# Patient Record
Sex: Male | Born: 1955
Health system: Southern US, Community
[De-identification: ages and names within clinical notes are randomized; demographics above are authoritative.]

## PROBLEM LIST (undated history)

## (undated) DIAGNOSIS — F329 Major depressive disorder, single episode, unspecified: Secondary | ICD-10-CM

## (undated) DIAGNOSIS — E041 Nontoxic single thyroid nodule: Secondary | ICD-10-CM

## (undated) DIAGNOSIS — G43009 Migraine without aura, not intractable, without status migrainosus: Secondary | ICD-10-CM

## (undated) DIAGNOSIS — G8929 Other chronic pain: Secondary | ICD-10-CM

## (undated) DIAGNOSIS — F411 Generalized anxiety disorder: Secondary | ICD-10-CM

## (undated) DIAGNOSIS — K5900 Constipation, unspecified: Secondary | ICD-10-CM

## (undated) DIAGNOSIS — F32A Depression, unspecified: Secondary | ICD-10-CM

## (undated) DIAGNOSIS — R519 Headache, unspecified: Secondary | ICD-10-CM

## (undated) DIAGNOSIS — M47812 Spondylosis without myelopathy or radiculopathy, cervical region: Secondary | ICD-10-CM

## (undated) DIAGNOSIS — M4802 Spinal stenosis, cervical region: Secondary | ICD-10-CM

## (undated) DIAGNOSIS — N486 Induration penis plastica: Secondary | ICD-10-CM

## (undated) DIAGNOSIS — I4510 Unspecified right bundle-branch block: Secondary | ICD-10-CM

## (undated) DIAGNOSIS — M79601 Pain in right arm: Secondary | ICD-10-CM

## (undated) DIAGNOSIS — F5221 Male erectile disorder: Secondary | ICD-10-CM

## (undated) DIAGNOSIS — T7840XA Allergy, unspecified, initial encounter: Secondary | ICD-10-CM

## (undated) DIAGNOSIS — M5416 Radiculopathy, lumbar region: Secondary | ICD-10-CM

## (undated) DIAGNOSIS — F1910 Other psychoactive substance abuse, uncomplicated: Secondary | ICD-10-CM

## (undated) DIAGNOSIS — M79602 Pain in left arm: Secondary | ICD-10-CM

## (undated) DIAGNOSIS — N401 Enlarged prostate with lower urinary tract symptoms: Secondary | ICD-10-CM

## (undated) DIAGNOSIS — M1990 Unspecified osteoarthritis, unspecified site: Secondary | ICD-10-CM

## (undated) DIAGNOSIS — D649 Anemia, unspecified: Secondary | ICD-10-CM

## (undated) DIAGNOSIS — L439 Lichen planus, unspecified: Secondary | ICD-10-CM

## (undated) DIAGNOSIS — E789 Disorder of lipoprotein metabolism, unspecified: Secondary | ICD-10-CM

## (undated) DIAGNOSIS — G4700 Insomnia, unspecified: Secondary | ICD-10-CM

## (undated) DIAGNOSIS — Z981 Arthrodesis status: Secondary | ICD-10-CM

## (undated) DIAGNOSIS — R51 Headache: Secondary | ICD-10-CM

## (undated) DIAGNOSIS — K219 Gastro-esophageal reflux disease without esophagitis: Secondary | ICD-10-CM

## (undated) DIAGNOSIS — M7918 Myalgia, other site: Secondary | ICD-10-CM

## (undated) DIAGNOSIS — F1020 Alcohol dependence, uncomplicated: Secondary | ICD-10-CM

## (undated) DIAGNOSIS — M5030 Other cervical disc degeneration, unspecified cervical region: Secondary | ICD-10-CM

## (undated) DIAGNOSIS — F419 Anxiety disorder, unspecified: Secondary | ICD-10-CM

## (undated) DIAGNOSIS — F1011 Alcohol abuse, in remission: Secondary | ICD-10-CM

## (undated) DIAGNOSIS — K210 Gastro-esophageal reflux disease with esophagitis: Secondary | ICD-10-CM

## (undated) DIAGNOSIS — H409 Unspecified glaucoma: Secondary | ICD-10-CM

## (undated) HISTORY — DX: Insomnia, unspecified: G47.00

## (undated) HISTORY — DX: Other chronic pain: G89.29

## (undated) HISTORY — DX: Anemia, unspecified: D64.9

## (undated) HISTORY — DX: Male erectile disorder: F52.21

## (undated) HISTORY — DX: Spondylosis without myelopathy or radiculopathy, cervical region: M47.812

## (undated) HISTORY — DX: Benign prostatic hyperplasia with lower urinary tract symptoms: N40.1

## (undated) HISTORY — DX: Unspecified right bundle-branch block: I45.10

## (undated) HISTORY — DX: Pain in left arm: M79.602

## (undated) HISTORY — DX: Gastro-esophageal reflux disease without esophagitis: K21.9

## (undated) HISTORY — DX: Arthrodesis status: Z98.1

## (undated) HISTORY — DX: Disorder of lipoprotein metabolism, unspecified: E78.9

## (undated) HISTORY — DX: Generalized anxiety disorder: F41.1

## (undated) HISTORY — DX: Gastro-esophageal reflux disease with esophagitis: K21.0

## (undated) HISTORY — DX: Constipation, unspecified: K59.00

## (undated) HISTORY — DX: Anxiety disorder, unspecified: F41.9

## (undated) HISTORY — DX: Other cervical disc degeneration, unspecified cervical region: M50.30

## (undated) HISTORY — DX: Myalgia, other site: M79.18

## (undated) HISTORY — DX: Unspecified osteoarthritis, unspecified site: M19.90

## (undated) HISTORY — DX: Unspecified glaucoma: H40.9

## (undated) HISTORY — DX: Migraine without aura, not intractable, without status migrainosus: G43.009

## (undated) HISTORY — DX: Nontoxic single thyroid nodule: E04.1

## (undated) HISTORY — PX: HERNIA REPAIR: SHX51

## (undated) HISTORY — DX: Other psychoactive substance abuse, uncomplicated: F19.10

## (undated) HISTORY — DX: Alcohol abuse, in remission: F10.11

## (undated) HISTORY — DX: Alcohol dependence, uncomplicated: F10.20

## (undated) HISTORY — DX: Headache, unspecified: R51.9

## (undated) HISTORY — DX: Depression, unspecified: F32.A

## (undated) HISTORY — DX: Radiculopathy, lumbar region: M54.16

## (undated) HISTORY — DX: Major depressive disorder, single episode, unspecified: F32.9

## (undated) HISTORY — DX: Allergy, unspecified, initial encounter: T78.40XA

## (undated) HISTORY — DX: Induration penis plastica: N48.6

## (undated) HISTORY — DX: Spinal stenosis, cervical region: M48.02

## (undated) HISTORY — DX: Headache: R51

## (undated) HISTORY — DX: Pain in right arm: M79.601

## (undated) HISTORY — PX: SPINE SURGERY: SHX786

## (undated) HISTORY — DX: Lichen planus, unspecified: L43.9

---

## 2008-11-12 DIAGNOSIS — M4802 Spinal stenosis, cervical region: Secondary | ICD-10-CM

## 2008-11-12 HISTORY — PX: CERVICAL SPINE SURGERY: SHX589

## 2008-11-12 HISTORY — DX: Spinal stenosis, cervical region: M48.02

## 2014-04-29 DIAGNOSIS — M47812 Spondylosis without myelopathy or radiculopathy, cervical region: Secondary | ICD-10-CM

## 2014-04-29 DIAGNOSIS — Z981 Arthrodesis status: Secondary | ICD-10-CM | POA: Insufficient documentation

## 2014-04-29 DIAGNOSIS — M79601 Pain in right arm: Secondary | ICD-10-CM | POA: Insufficient documentation

## 2014-04-29 DIAGNOSIS — M79602 Pain in left arm: Secondary | ICD-10-CM | POA: Insufficient documentation

## 2014-04-29 DIAGNOSIS — M5030 Other cervical disc degeneration, unspecified cervical region: Secondary | ICD-10-CM

## 2014-04-29 HISTORY — DX: Spondylosis without myelopathy or radiculopathy, cervical region: M47.812

## 2014-04-29 HISTORY — DX: Other cervical disc degeneration, unspecified cervical region: M50.30

## 2014-04-29 HISTORY — DX: Arthrodesis status: Z98.1

## 2016-03-29 DIAGNOSIS — G4700 Insomnia, unspecified: Secondary | ICD-10-CM

## 2016-03-29 DIAGNOSIS — F331 Major depressive disorder, recurrent, moderate: Secondary | ICD-10-CM | POA: Insufficient documentation

## 2016-03-29 DIAGNOSIS — F1011 Alcohol abuse, in remission: Secondary | ICD-10-CM

## 2016-03-29 DIAGNOSIS — F411 Generalized anxiety disorder: Secondary | ICD-10-CM | POA: Insufficient documentation

## 2016-03-29 HISTORY — DX: Generalized anxiety disorder: F41.1

## 2016-03-29 HISTORY — DX: Alcohol abuse, in remission: F10.11

## 2016-03-29 HISTORY — DX: Insomnia, unspecified: G47.00

## 2016-09-24 LAB — LIPID PANEL
Cholesterol: 182 (ref 0–200)
LDL Cholesterol: 104
Triglycerides: 74 (ref 40–160)

## 2016-09-24 LAB — BASIC METABOLIC PANEL
BUN: 8 (ref 4–21)
Creatinine: 1 (ref 0.6–1.3)
GLUCOSE: 102
Potassium: 4.2 (ref 3.4–5.3)
Sodium: 140 (ref 137–147)

## 2016-09-24 LAB — CBC AND DIFFERENTIAL
HCT: 44 (ref 41–53)
Hemoglobin: 14.9 (ref 13.5–17.5)
Neutrophils Absolute: 3053
PLATELETS: 246 (ref 150–399)
WBC: 4.9

## 2016-09-24 LAB — HEMOGLOBIN A1C: Hemoglobin A1C: 5

## 2016-09-24 LAB — PSA: PSA: 1.2

## 2016-09-24 LAB — HEPATIC FUNCTION PANEL
ALK PHOS: 56 (ref 25–125)
ALT: 13 (ref 10–40)
AST: 14 (ref 14–40)
BILIRUBIN, TOTAL: 0.6

## 2016-09-24 LAB — IRON,TIBC AND FERRITIN PANEL: Iron: 138

## 2016-09-24 LAB — TSH: TSH: 1.97 (ref 0.41–5.90)

## 2016-09-24 LAB — VITAMIN B12: VITAMIN B 12: 416

## 2016-09-24 LAB — VITAMIN D 25 HYDROXY (VIT D DEFICIENCY, FRACTURES): VIT D 25 HYDROXY: 32

## 2016-10-18 DIAGNOSIS — I4510 Unspecified right bundle-branch block: Secondary | ICD-10-CM | POA: Insufficient documentation

## 2016-10-18 HISTORY — DX: Unspecified right bundle-branch block: I45.10

## 2016-11-12 DIAGNOSIS — N486 Induration penis plastica: Secondary | ICD-10-CM | POA: Insufficient documentation

## 2016-11-12 HISTORY — DX: Induration penis plastica: N48.6

## 2017-10-23 LAB — CBC AND DIFFERENTIAL
HCT: 43 (ref 41–53)
HEMATOCRIT: 43 (ref 41–53)
HEMOGLOBIN: 14.7 (ref 13.5–17.5)
Hemoglobin: 14.7 (ref 13.5–17.5)
NEUTROS ABS: 2832
PLATELETS: 220 (ref 150–399)
Platelets: 220 (ref 150–399)
WBC: 4.9
WBC: 4.9

## 2017-10-23 LAB — HEPATIC FUNCTION PANEL
ALT: 12 (ref 10–40)
AST: 13 — AB (ref 14–40)
Bilirubin, Total: 0.5

## 2017-10-23 LAB — LIPID PANEL
Cholesterol: 190 (ref 0–200)
HDL: 66 (ref 35–70)
LDL CALC: 107
TRIGLYCERIDES: 82 (ref 40–160)
Triglycerides: 82 (ref 40–160)

## 2017-10-23 LAB — BASIC METABOLIC PANEL
BUN: 10 (ref 4–21)
Creatinine: 1 (ref 0.6–1.3)
Glucose: 2
Glucose: 94
Potassium: 3.8 (ref 3.4–5.3)
SODIUM: 142 (ref 137–147)
Sodium: 142 (ref 137–147)

## 2017-10-23 LAB — PSA: PSA: 1

## 2017-10-23 LAB — VITAMIN B12
Vitamin B-12: 695
Vitamin B-12: 695

## 2017-10-23 LAB — HEMOGLOBIN A1C: HEMOGLOBIN A1C: 5.3

## 2017-10-23 LAB — TSH: TSH: 2.71 (ref 0.41–5.90)

## 2017-10-23 LAB — IRON,TIBC AND FERRITIN PANEL: IRON: 124

## 2017-10-23 LAB — VITAMIN D 25 HYDROXY (VIT D DEFICIENCY, FRACTURES): VIT D 25 HYDROXY: 34

## 2017-11-21 ENCOUNTER — Ambulatory Visit (INDEPENDENT_AMBULATORY_CARE_PROVIDER_SITE_OTHER): Payer: Managed Care, Other (non HMO) | Admitting: Family Medicine

## 2017-11-21 ENCOUNTER — Encounter: Payer: Self-pay | Admitting: Family Medicine

## 2017-11-21 VITALS — BP 130/76 | HR 75 | Temp 98.4°F | Wt 221.2 lb

## 2017-11-21 DIAGNOSIS — F3130 Bipolar disorder, current episode depressed, mild or moderate severity, unspecified: Secondary | ICD-10-CM | POA: Diagnosis not present

## 2017-11-21 DIAGNOSIS — Z981 Arthrodesis status: Secondary | ICD-10-CM | POA: Diagnosis not present

## 2017-11-21 DIAGNOSIS — G8929 Other chronic pain: Secondary | ICD-10-CM | POA: Diagnosis not present

## 2017-11-21 DIAGNOSIS — H409 Unspecified glaucoma: Secondary | ICD-10-CM

## 2017-11-21 DIAGNOSIS — E041 Nontoxic single thyroid nodule: Secondary | ICD-10-CM | POA: Diagnosis not present

## 2017-11-21 DIAGNOSIS — N486 Induration penis plastica: Secondary | ICD-10-CM

## 2017-11-21 DIAGNOSIS — N5201 Erectile dysfunction due to arterial insufficiency: Secondary | ICD-10-CM | POA: Insufficient documentation

## 2017-11-21 DIAGNOSIS — M7918 Myalgia, other site: Secondary | ICD-10-CM | POA: Diagnosis not present

## 2017-11-21 DIAGNOSIS — F319 Bipolar disorder, unspecified: Secondary | ICD-10-CM

## 2017-11-21 DIAGNOSIS — F5221 Male erectile disorder: Secondary | ICD-10-CM

## 2017-11-21 DIAGNOSIS — N401 Enlarged prostate with lower urinary tract symptoms: Secondary | ICD-10-CM

## 2017-11-21 DIAGNOSIS — K2100 Gastro-esophageal reflux disease with esophagitis, without bleeding: Secondary | ICD-10-CM

## 2017-11-21 DIAGNOSIS — M5416 Radiculopathy, lumbar region: Secondary | ICD-10-CM | POA: Insufficient documentation

## 2017-11-21 DIAGNOSIS — E789 Disorder of lipoprotein metabolism, unspecified: Secondary | ICD-10-CM | POA: Insufficient documentation

## 2017-11-21 DIAGNOSIS — D649 Anemia, unspecified: Secondary | ICD-10-CM

## 2017-11-21 DIAGNOSIS — G43909 Migraine, unspecified, not intractable, without status migrainosus: Secondary | ICD-10-CM | POA: Insufficient documentation

## 2017-11-21 DIAGNOSIS — G4700 Insomnia, unspecified: Secondary | ICD-10-CM

## 2017-11-21 DIAGNOSIS — F411 Generalized anxiety disorder: Secondary | ICD-10-CM

## 2017-11-21 DIAGNOSIS — L439 Lichen planus, unspecified: Secondary | ICD-10-CM

## 2017-11-21 DIAGNOSIS — F329 Major depressive disorder, single episode, unspecified: Secondary | ICD-10-CM | POA: Insufficient documentation

## 2017-11-21 DIAGNOSIS — K5909 Other constipation: Secondary | ICD-10-CM | POA: Insufficient documentation

## 2017-11-21 DIAGNOSIS — G43009 Migraine without aura, not intractable, without status migrainosus: Secondary | ICD-10-CM

## 2017-11-21 DIAGNOSIS — F32A Depression, unspecified: Secondary | ICD-10-CM | POA: Insufficient documentation

## 2017-11-21 DIAGNOSIS — K2970 Gastritis, unspecified, without bleeding: Secondary | ICD-10-CM | POA: Insufficient documentation

## 2017-11-21 DIAGNOSIS — K5900 Constipation, unspecified: Secondary | ICD-10-CM | POA: Insufficient documentation

## 2017-11-21 HISTORY — DX: Benign prostatic hyperplasia with lower urinary tract symptoms: N40.1

## 2017-11-21 HISTORY — DX: Migraine without aura, not intractable, without status migrainosus: G43.009

## 2017-11-21 HISTORY — DX: Other chronic pain: G89.29

## 2017-11-21 HISTORY — DX: Myalgia, other site: M79.18

## 2017-11-21 HISTORY — DX: Anemia, unspecified: D64.9

## 2017-11-21 HISTORY — DX: Nontoxic single thyroid nodule: E04.1

## 2017-11-21 HISTORY — DX: Unspecified glaucoma: H40.9

## 2017-11-21 HISTORY — DX: Male erectile disorder: F52.21

## 2017-11-21 HISTORY — DX: Gastro-esophageal reflux disease with esophagitis, without bleeding: K21.00

## 2017-11-21 HISTORY — DX: Lichen planus, unspecified: L43.9

## 2017-11-21 MED ORDER — BUPROPION HCL ER (XL) 300 MG PO TB24: 300.0000 mg | ORAL_TABLET | Freq: Every day | ORAL | 1 refills | Status: DC

## 2017-11-21 NOTE — Progress Notes (Signed)
Michael Garcia is a 62 y.o. male is here to [place].   Patient Care Team: Briscoe Deutscher, DO as PCP - General (Family Medicine)   History of Present Illness:   Michael Garcia, CMA acting as scribe for Dr. Briscoe Deutscher.   HPI:   Patient has been going [facility] for specialist visits in [place]. Seeing pain medications in regard to migraines and cervical pain. Patient has not tried Botox injections. He has appointments in March for follow up with pain management and several other specialist but he is not going to be able to continue and would like referral to local specialist.  Psychology. He admits to having trouble sleeping and does not feel that his medications need to be evaluated. He was given information for [employer].   Endocrinology: Will just follow up yearly with provider in [place] yearly.  Urology: will need referral for local provider.   He does have local ophthalmologist [facility]. Saw last week. Will send notes.  Pain: States that he only gets refill on flexeril about every three months. Would like referral for pain clinic and headache clinic. Has done some physical therapy in the past for cervical pain.    Health Maintenance Due  Topic Date Due  . Hepatitis C Screening  February 27, 1956  . HIV Screening  05/01/1973  . TETANUS/TDAP  05/01/1977  . COLONOSCOPY  05/01/2008   Depression screen PHQ 2/9 11/21/2017  Decreased Interest 0  Down, Depressed, Hopeless 1  PHQ - 2 Score 1  Altered sleeping 3  Tired, decreased energy 0  Change in appetite 0  Feeling bad or failure about yourself  0  Trouble concentrating 1  Moving slowly or fidgety/restless 0  Suicidal thoughts 0  PHQ-9 Score 5   PMHx, SurgHx, SocialHx, Medications, and Allergies were reviewed in the Visit Navigator and updated as appropriate.   Past Medical History:  Diagnosis Date  . Anemia 11/21/2017  . Benign non-nodular prostatic hyperplasia with lower urinary tract  symptoms 11/21/2017  . Bilateral arm pain 04/29/2014  . Bipolar disorder with depression ([place]) 11/21/2017  . Cervical spondylosis without myelopathy 04/29/2014  . Cervical stenosis of spinal canal 11/12/2008  . Chronic myofascial pain 11/21/2017  . Constipation 11/21/2017  . Degenerative disc disease, cervical 04/29/2014  . Erectile disorder, acquired, generalized, severe 11/21/2017  . GAD (generalized anxiety disorder) 03/29/2016  . Gastro-esophageal reflux disease with esophagitis 11/21/2017  . Glaucoma 11/21/2017  . Incomplete right bundle branch block 10/18/2016  . Insomnia, persistent 03/29/2016  . Lichen planus, penis 11/21/2017  . Lipid disorder 11/21/2017  . Lumbar radicular pain 11/21/2017  . Nondependent alcohol abuse, in remission 03/29/2016  . Nonintractable migraine, unspecified migraine type 11/21/2017  . Peyronie disease 11/12/2016  . Status post cervical spinal fusion 04/29/2014  . Thyroid nodule 11/21/2017   History reviewed. No pertinent surgical history. History reviewed. No pertinent family history. Social History   Tobacco Use  . Smoking status: Former Smoker    Types: Cigarettes    Last attempt to quit: 1994    Years since quitting: 25.0  . Smokeless tobacco: Never Used  Substance Use Topics  . Alcohol use: No    Frequency: Never  . Drug use: No    Current Medications and Allergies:   .  alfuzosin (UROXATRAL) 10 MG 24 hr tablet, Take 1 tablet by mouth daily., Disp: , Rfl: 11 .  ALPRAZolam (XANAX) 1 MG tablet, Take 1 tablet by mouth daily as needed., Disp: , Rfl:  .  buPROPion (WELLBUTRIN XL) 150 MG 24 hr tablet, Take 1 tablet by mouth daily., Disp: , Rfl:  .  cetirizine (ZYRTEC) 10 MG tablet, Take 1 tablet by mouth daily., Disp: , Rfl:  .  cyclobenzaprine (FLEXERIL) 10 MG tablet, Take 1 tablet by mouth as needed., Disp: , Rfl:  .  esomeprazole (NEXIUM) 40 MG capsule, TAKE ONE CAPSULE BY MOUTH EVERY DAY, Disp: , Rfl:  .  FIBER PO, Take by mouth., Disp: , Rfl:  .   fluticasone (FLONASE) 50 MCG/ACT nasal spray, , Disp: , Rfl:  .  Galcanezumab-gnlm (EMGALITY) 120 MG/ML SOAJ, Inject into the skin., Disp: , Rfl:  .  lamoTRIgine (LAMICTAL) 200 MG tablet, TK 1 T PO D, Disp: , Rfl: 3 .  lidocaine (LIDODERM) 5 %, USE AS DIRECTED ONCE DAILY EXTERNALLY, Disp: , Rfl:  .  Lidocaine 5 % CREA, , Disp: , Rfl:  .  Melatonin 3 MG TABS, , Disp: , Rfl:  .  MULTIPLE VITAMIN PO, Take by mouth., Disp: , Rfl:  .  rizatriptan (MAXALT) 10 MG tablet, Take by mouth., Disp: , Rfl:  .  sildenafil (VIAGRA) 100 MG tablet, , Disp: , Rfl:  .  tacrolimus (PROTOPIC) 0.1 % ointment, APPLY TO AFFECTED AREAS UTD, Disp: , Rfl:  .  traZODone (DESYREL) 100 MG tablet, Take by mouth., Disp: , Rfl:    Allergies  Allergen Reactions  . Bacitracin Itching and Rash  . Iodine Itching and Rash    IV Iodine   . Sulfamethoxazole-Trimethoprim Rash   Review of Systems:   Pertinent items are noted in the HPI. Otherwise, ROS is negative.  Vitals:   Vitals:   11/21/17 0853  BP: 130/76  Pulse: 75  Temp: 98.4 F (36.9 C)  TempSrc: Oral  SpO2: 96%  Weight: 221 lb 3.2 oz (100.3 kg)     There is no height or weight on file to calculate BMI.  Physical Exam:   Physical Exam  Constitutional: He is oriented to person, place, and time. He appears well-developed and well-nourished. No distress.  HENT:  Head: Normocephalic and atraumatic.  Right Ear: External ear normal.  Left Ear: External ear normal.  Nose: Nose normal.  Mouth/Throat: Oropharynx is clear and moist.  Eyes: Conjunctivae and EOM are normal. Pupils are equal, round, and reactive to light.  Neck: Normal range of motion. Neck supple.  Cardiovascular: Normal rate, regular rhythm, normal heart sounds and intact distal pulses.  Pulmonary/Chest: Effort normal and breath sounds normal.  Abdominal: Soft. Bowel sounds are normal.  Musculoskeletal: Normal range of motion.  Neurological: He is alert and oriented to person, place, and  time.  Skin: Skin is warm and dry.  Psychiatric: He has a normal mood and affect. His behavior is normal. Judgment and thought content normal.  Nursing note and vitals reviewed.  Assessment and Plan:   1. Status post cervical spinal fusion See numbers 6 and 7.  2. Thyroid nodule Patient states that he has been cleared for evaluation in 1 year.  3. Bipolar disorder with depression ([place]) Patient suffers from depressive disorder more than anything with his bipolar.  He has been stable on medications for quite some time.  He does request an increase in Wellbutrin today which would be appropriate.  I am okay with continuing with his current treatment at this time.  He will be calling [facility] for regular therapy.  I did let him know that if his medications need to be adjusted more often, that we  will require him to see psychiatry.  He agrees to this.  He denies any suicidal homicidal ideations today.  4. Lichen planus Controlled with current medications.  Okay refill Protopic when needed.  5. Erectile disorder, acquired, generalized, severe Patient is experiencing erectile dysfunction as well as other urologic concerns.  Prior to leaving [place], he was offered surgical options.  He is not interested in going that route at this time.  He does need follow-up with urology and I will go ahead and put that referral in.  - Ambulatory referral to Urology  6. Nonintractable migraine This issue, combined with the below chronic myofascial pain, is the patient's most concerning issue today.  He has chronic headaches and upper back pain.  He had a previous cervical fusion.  His most recent notes from [place] were reviewed by me.  The patient would like to find a way to treat headaches and upper back pain at the same time to stop the pain loop.  I will work on finding the appropriate specialist to help [place] with this.  He is interested in Botox injections, trigger point injections, acupuncture,  physical therapy, or any other nonmedication modalities for pain control if they would help.  7. Chronic myofascial pain See above.  8. Insomnia, persistent Patient uses trazodone.  Okay to continue at this time.  9. GAD (generalized anxiety disorder) Patient uses Xanax sparingly.  Okay to refill unless he starts to increase need.  10. Peyronie disease See above for referral to urology.  . Reviewed expectations re: course of current medical issues. . Discussed self-management of symptoms. . Outlined signs and symptoms indicating need for more acute intervention. . Patient verbalized understanding and all questions were answered. Marland Kitchen Health Maintenance issues including appropriate healthy diet, exercise, and smoking avoidance were discussed with patient. . See orders for this visit as documented in the electronic medical record. . Patient received an After Visit Summary.  CMA served as Education administrator during this visit. History, Physical, and Plan performed by medical provider. The above documentation has been reviewed and is accurate and complete. Briscoe Deutscher, D.O.   Briscoe Deutscher, DO [place] [place] 11/23/2017  Records requested if needed. Time spent with the patient: 30 minutes, of which >50% was spent in obtaining information about his symptoms, reviewing his previous labs, evaluations, and treatments, counseling him about his condition (please see the discussed topics above), and developing a plan to further investigate it; he had a number of questions which I addressed.

## 2017-11-23 ENCOUNTER — Encounter: Payer: Self-pay | Admitting: Family Medicine

## 2017-12-10 ENCOUNTER — Encounter: Payer: Self-pay | Admitting: Family Medicine

## 2017-12-11 ENCOUNTER — Other Ambulatory Visit: Payer: Self-pay | Admitting: Family Medicine

## 2017-12-11 DIAGNOSIS — N486 Induration penis plastica: Secondary | ICD-10-CM

## 2017-12-11 DIAGNOSIS — M4802 Spinal stenosis, cervical region: Secondary | ICD-10-CM

## 2017-12-11 DIAGNOSIS — M5030 Other cervical disc degeneration, unspecified cervical region: Secondary | ICD-10-CM

## 2017-12-11 DIAGNOSIS — M7918 Myalgia, other site: Secondary | ICD-10-CM

## 2017-12-11 DIAGNOSIS — F319 Bipolar disorder, unspecified: Secondary | ICD-10-CM

## 2017-12-11 DIAGNOSIS — M47812 Spondylosis without myelopathy or radiculopathy, cervical region: Secondary | ICD-10-CM

## 2017-12-11 DIAGNOSIS — N401 Enlarged prostate with lower urinary tract symptoms: Secondary | ICD-10-CM

## 2017-12-11 DIAGNOSIS — Z981 Arthrodesis status: Secondary | ICD-10-CM

## 2017-12-11 DIAGNOSIS — M5416 Radiculopathy, lumbar region: Secondary | ICD-10-CM

## 2017-12-11 DIAGNOSIS — G8929 Other chronic pain: Secondary | ICD-10-CM

## 2018-01-13 ENCOUNTER — Encounter: Payer: Self-pay | Admitting: Family Medicine

## 2018-01-15 ENCOUNTER — Other Ambulatory Visit: Payer: Self-pay | Admitting: Surgical

## 2018-01-15 MED ORDER — LIDOCAINE 5 % EX PTCH: MEDICATED_PATCH | CUTANEOUS | 2 refills | Status: DC

## 2018-01-15 MED ORDER — GALCANEZUMAB-GNLM 120 MG/ML ~~LOC~~ SOAJ: 120.0000 mg | SUBCUTANEOUS | 3 refills | Status: DC | PRN

## 2018-01-15 MED ORDER — BUPROPION HCL ER (XL) 300 MG PO TB24: 300.0000 mg | ORAL_TABLET | Freq: Every day | ORAL | 1 refills | Status: DC

## 2018-01-15 MED ORDER — LAMOTRIGINE 200 MG PO TABS: ORAL_TABLET | ORAL | 3 refills | Status: DC

## 2018-01-15 MED ORDER — TRAZODONE HCL 100 MG PO TABS: 100.0000 mg | ORAL_TABLET | Freq: Every evening | ORAL | 2 refills | Status: DC | PRN

## 2018-01-15 MED ORDER — RIZATRIPTAN BENZOATE 10 MG PO TABS: 10.0000 mg | ORAL_TABLET | ORAL | 1 refills | Status: DC | PRN

## 2018-01-21 ENCOUNTER — Telehealth: Payer: Self-pay

## 2018-01-21 NOTE — Telephone Encounter (Signed)
Pre auth started for [employer]  Key: [id number] Started on cover my meds.

## 2018-01-23 ENCOUNTER — Other Ambulatory Visit: Payer: Self-pay | Admitting: Surgical

## 2018-01-23 ENCOUNTER — Encounter: Payer: Self-pay | Admitting: Family Medicine

## 2018-01-23 DIAGNOSIS — Z1211 Encounter for screening for malignant neoplasm of colon: Secondary | ICD-10-CM

## 2018-01-23 MED ORDER — ALFUZOSIN HCL ER 10 MG PO TB24: 10.0000 mg | ORAL_TABLET | Freq: Every day | ORAL | 0 refills | Status: DC

## 2018-01-24 ENCOUNTER — Encounter: Payer: Self-pay | Admitting: Gastroenterology

## 2018-01-27 DIAGNOSIS — N5201 Erectile dysfunction due to arterial insufficiency: Secondary | ICD-10-CM | POA: Diagnosis not present

## 2018-01-27 DIAGNOSIS — N486 Induration penis plastica: Secondary | ICD-10-CM | POA: Diagnosis not present

## 2018-01-27 DIAGNOSIS — R3915 Urgency of urination: Secondary | ICD-10-CM | POA: Diagnosis not present

## 2018-01-28 NOTE — Telephone Encounter (Signed)
Fax received from insurance medication denied:  Requires that you try two of the listed treatments I have put fax on your desk for review.

## 2018-01-29 NOTE — Telephone Encounter (Signed)
Given back to you or Michael Garcia. Will need to contact patient re: previous medication trials versus getting specialist to take care of this.

## 2018-01-30 NOTE — Telephone Encounter (Signed)
Called patient he has not heard from Dr. Maryjean Ka. I called his office and spoke with them. Their was some confusion about referral and it had not been made yet. She is going to speak with provider and see where patient can be worked in as soon as possible and give him a call. I have notified patient if he has not heard from them by Monday he will call our office. He would like to hold of and try to get meds approved by their office he has not tried any of the meds listed yet. ppw sent to scan.

## 2018-02-03 NOTE — Telephone Encounter (Signed)
Per CRM: Source Subject Topic  Pleasant, [place] "Michael Garcia" (Patient) [name] "Michael Garcia" (Patient) Quick Communication - See Telephone Encounter  CRM for notification. See Telephone encounter for: 02/03/18.    Cover my Meds called in to follow up, they are stating that appeal paperwork for [employer] was faxed over on Friday 01/31/18 and would like to confirm receipt.     Please advise. CB: [id number] REF# [id number]

## 2018-02-03 NOTE — Telephone Encounter (Signed)
We are not getting auth the pain clinic will work on that .

## 2018-02-06 ENCOUNTER — Encounter: Payer: Self-pay | Admitting: Family Medicine

## 2018-02-07 ENCOUNTER — Other Ambulatory Visit: Payer: Self-pay | Admitting: Surgical

## 2018-02-07 DIAGNOSIS — M5030 Other cervical disc degeneration, unspecified cervical region: Secondary | ICD-10-CM

## 2018-02-07 NOTE — Telephone Encounter (Signed)
Patient will need to have new referral placed to see Dr. Ernestina Patches with [facility]. Once referral is placed I will route appropriately.

## 2018-02-17 ENCOUNTER — Encounter: Payer: Self-pay | Admitting: Family Medicine

## 2018-02-19 MED ORDER — MELOXICAM 15 MG PO TABS: 15.0000 mg | ORAL_TABLET | Freq: Every day | ORAL | 0 refills | Status: DC

## 2018-02-22 ENCOUNTER — Other Ambulatory Visit: Payer: Self-pay | Admitting: Family Medicine

## 2018-02-24 MED ORDER — ALFUZOSIN HCL ER 10 MG PO TB24: 10.0000 mg | ORAL_TABLET | Freq: Every day | ORAL | 0 refills | Status: DC

## 2018-03-04 ENCOUNTER — Ambulatory Visit (INDEPENDENT_AMBULATORY_CARE_PROVIDER_SITE_OTHER): Payer: 59

## 2018-03-04 ENCOUNTER — Ambulatory Visit (INDEPENDENT_AMBULATORY_CARE_PROVIDER_SITE_OTHER): Payer: 59 | Admitting: Physical Medicine and Rehabilitation

## 2018-03-04 ENCOUNTER — Encounter: Payer: Self-pay | Admitting: Family Medicine

## 2018-03-04 ENCOUNTER — Encounter (INDEPENDENT_AMBULATORY_CARE_PROVIDER_SITE_OTHER): Payer: Self-pay | Admitting: Physical Medicine and Rehabilitation

## 2018-03-04 VITALS — BP 117/74 | HR 76

## 2018-03-04 DIAGNOSIS — M545 Low back pain: Secondary | ICD-10-CM | POA: Diagnosis not present

## 2018-03-04 DIAGNOSIS — M542 Cervicalgia: Secondary | ICD-10-CM | POA: Diagnosis not present

## 2018-03-04 DIAGNOSIS — Z981 Arthrodesis status: Secondary | ICD-10-CM

## 2018-03-04 DIAGNOSIS — M4802 Spinal stenosis, cervical region: Secondary | ICD-10-CM

## 2018-03-04 DIAGNOSIS — M5416 Radiculopathy, lumbar region: Secondary | ICD-10-CM | POA: Diagnosis not present

## 2018-03-04 NOTE — Progress Notes (Signed)
  Numeric Pain Rating Scale and Functional Assessment Average Pain 6   In the last MONTH (on 0-10 scale) has pain interfered with the following?  1. General activity like being  able to carry out your everyday physical activities such as walking, climbing stairs, carrying groceries, or moving a chair?  Rating(4)     

## 2018-03-06 ENCOUNTER — Other Ambulatory Visit (INDEPENDENT_AMBULATORY_CARE_PROVIDER_SITE_OTHER): Payer: Self-pay | Admitting: Physical Medicine and Rehabilitation

## 2018-03-06 DIAGNOSIS — M4802 Spinal stenosis, cervical region: Secondary | ICD-10-CM

## 2018-03-06 DIAGNOSIS — Z981 Arthrodesis status: Secondary | ICD-10-CM

## 2018-03-06 NOTE — Progress Notes (Unsigned)
amb ref °

## 2018-03-10 ENCOUNTER — Encounter: Payer: Self-pay | Admitting: Gastroenterology

## 2018-03-10 ENCOUNTER — Ambulatory Visit: Payer: 59 | Admitting: Gastroenterology

## 2018-03-10 VITALS — BP 122/78 | HR 83 | Ht 76.0 in | Wt 223.5 lb

## 2018-03-10 DIAGNOSIS — K2100 Gastro-esophageal reflux disease with esophagitis, without bleeding: Secondary | ICD-10-CM

## 2018-03-10 DIAGNOSIS — K5900 Constipation, unspecified: Secondary | ICD-10-CM

## 2018-03-10 NOTE — Progress Notes (Signed)
HPI: This is a very pleasant 62 year old man who was referred to me by Briscoe Deutscher, DO  to evaluate chronic constipation, chronic GERD.    Chief complaint is constipation, GERD  Constipation chroincally, getting worse. Takes in a lot of fiber daily (supplements).   He does not see blood in his stool  Has GERD 30 years on nexium; before moirning usually. Terrible pyrosis if he misses it.  About once per week he will have acid regurge, minor vomiting.  Generally occurs if constipated.  Certain foods (pizza, tomato based,).  No etoh, non smoker  Big caffeine drinker.  At least 4 large cups of coffee daily  Gaining weight 10 pounds in past two months.  Constipated, up to 5 days without a BM despite fiber above.  No dysphagia.  Colon and EGD 5 years ago: something removed from his stomach (??).  "Diverticulitis-like;"  and he was told to have repeat colonoscopy at 5 years Not sure if any polpyp  Uncle with colon cancer.   Old Data Reviewed: Labs 10/2017: cbc normal; bmet normal    Review of systems: Pertinent positive and negative review of systems were noted in the above HPI section. All other review negative.   Past Medical History:  Diagnosis Date  . Alcoholism ([place])   . Anemia 11/21/2017  . Anxiety   . Arthritis   . Benign non-nodular prostatic hyperplasia with lower urinary tract symptoms 11/21/2017  . Bilateral arm pain 04/29/2014  . Bipolar disorder with depression ([place]) 11/21/2017  . Cervical spondylosis without myelopathy 04/29/2014  . Cervical stenosis of spinal canal 11/12/2008  . Chronic headaches   . Chronic myofascial pain 11/21/2017  . Constipation 11/21/2017  . Degenerative disc disease, cervical 04/29/2014  . Depression   . Erectile disorder, acquired, generalized, severe 11/21/2017  . GAD (generalized anxiety disorder) 03/29/2016  . Gastro-esophageal reflux disease with esophagitis 11/21/2017  . Glaucoma 11/21/2017  . Incomplete right bundle branch block  10/18/2016  . Insomnia, persistent 03/29/2016  . Lichen planus, penis 11/21/2017  . Lipid disorder 11/21/2017  . Lumbar radicular pain 11/21/2017  . Nondependent alcohol abuse, in remission 03/29/2016  . Nonintractable migraine, unspecified migraine type 11/21/2017  . Peyronie disease 11/12/2016  . Status post cervical spinal fusion 04/29/2014  . Thyroid nodule 11/21/2017    Past Surgical History:  Procedure Laterality Date  . CERVICAL SPINE SURGERY  2010   c6-c7 fusion  . HERNIA REPAIR      Current Outpatient Medications  Medication Sig Dispense Refill  . alfuzosin (UROXATRAL) 10 MG 24 hr tablet Take 1 tablet (10 mg total) by mouth daily. 30 tablet 0  . ALPRAZolam (XANAX) 1 MG tablet Take 1 tablet by mouth daily as needed.    Marland Kitchen buPROPion (WELLBUTRIN XL) 300 MG 24 hr tablet Take 1 tablet (300 mg total) by mouth daily. 90 tablet 1  . cetirizine (ZYRTEC) 10 MG tablet Take 1 tablet by mouth daily.    . cyclobenzaprine (FLEXERIL) 10 MG tablet Take 1 tablet by mouth as needed.    Marland Kitchen esomeprazole (NEXIUM) 40 MG capsule TAKE ONE CAPSULE BY MOUTH EVERY DAY    . FIBER PO Take by mouth.    . fluticasone (FLONASE) 50 MCG/ACT nasal spray     . Galcanezumab-gnlm (EMGALITY) 120 MG/ML SOAJ Inject 120 mg into the skin as needed. 1 mL 3  . lamoTRIgine (LAMICTAL) 200 MG tablet TK 1 T PO D 90 tablet 3  . lidocaine (LIDODERM) 5 % USE AS DIRECTED ONCE  DAILY EXTERNALLY 30 patch 2  . Lidocaine 5 % CREA     . Melatonin 3 MG TABS     . meloxicam (MOBIC) 15 MG tablet Take 1 tablet (15 mg total) by mouth daily. 30 tablet 0  . MULTIPLE VITAMIN PO Take by mouth.    . rizatriptan (MAXALT) 10 MG tablet Take 1 tablet (10 mg total) by mouth as needed for migraine. 10 tablet 1  . sildenafil (VIAGRA) 100 MG tablet     . tacrolimus (PROTOPIC) 0.1 % ointment APPLY TO AFFECTED AREAS UTD    . traZODone (DESYREL) 100 MG tablet Take 1 tablet (100 mg total) by mouth at bedtime as needed for sleep. 30 tablet 2   No current  facility-administered medications for this visit.     Allergies as of 03/10/2018 - Review Complete 03/10/2018  Allergen Reaction Noted  . Bacitracin Itching and Rash 03/13/2015  . Iodine Itching and Rash 04/12/1980  . Sulfamethoxazole-trimethoprim Rash 02/02/2016    Family History  Problem Relation Age of Onset  . Heart disease Mother   . Irritable bowel syndrome Mother   . Diabetes Sister     Social History   Socioeconomic History  . Marital status: Married    Spouse name: Not on file  . Number of children: Not on file  . Years of education: Not on file  . Highest education level: Not on file  Occupational History  . Occupation: Scientist, physiological: [employer]  . Financial resource strain: Not on file  . Food insecurity:    Worry: Not on file    Inability: Not on file  . Transportation needs:    Medical: Not on file    Non-medical: Not on file  Tobacco Use  . Smoking status: Former Smoker    Types: Cigarettes    Last attempt to quit: 1994    Years since quitting: 25.3  . Smokeless tobacco: Never Used  Substance and Sexual Activity  . Alcohol use: No    Frequency: Never  . Drug use: No  . Sexual activity: Not on file  Lifestyle  . Physical activity:    Days per week: Not on file    Minutes per session: Not on file  . Stress: Not on file  Relationships  . Social connections:    Talks on phone: Not on file    Gets together: Not on file    Attends religious service: Not on file    Active member of club or organization: Not on file    Attends meetings of clubs or organizations: Not on file    Relationship status: Not on file  . Intimate partner violence:    Fear of current or ex partner: Not on file    Emotionally abused: Not on file    Physically abused: Not on file    Forced sexual activity: Not on file  Other Topics Concern  . Not on file  Social History Narrative   Patient recently moved to this area with his wife and daughter.  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  Though he has many medical issues, he is a generally healthy appearing gentleman.  He does not exercise regularly but does try to exercise as much as possible.  No high risk behaviors.  He was previously traveling to [facility] for treatments but will be transitioning his care to [place].     Physical Exam: BP 122/78   Pulse 83  Ht 6\' 4"  (1.93 m)   Wt 223 lb 8 oz (101.4 kg)   BMI 27.21 kg/m  Constitutional: generally well-appearing Psychiatric: alert and oriented x3 Eyes: extraocular movements intact Mouth: oral pharynx moist, no lesions Neck: supple no lymphadenopathy Cardiovascular: heart regular rate and rhythm Lungs: clear to auscultation bilaterally Abdomen: soft, nontender, nondistended, no obvious ascites, no peritoneal signs, normal bowel sounds Extremities: no lower extremity edema bilaterally Skin: no lesions on visible extremities   Assessment and plan: 62 y.o. male with chronic GERD, chronic constipation  Neither of these symptoms require invasive testing currently.  I do recommend that he try adding MiraLAX to his fiber regimen daily.  I also recommend that he consider H2-blocker on the nights when he takes in a lot of tomato based products because those seem to be associated with his breakthrough GERD symptoms.  We will get records from his [facility] colonoscopy, upper endoscopy.  He was told by his previous doctor that he needed a colonoscopy 5 years from his last one however I am not sure why "diverticulitis-like findings" would necessitate a repeat colonoscopy in 5-year interval and so I will look into it a bit further.  Please see the "Patient Instructions" section for addition details about the plan.   Owens Loffler, MD [facility] 03/10/2018, 10:02 AM  Cc: Briscoe Deutscher, DO

## 2018-03-10 NOTE — Patient Instructions (Addendum)
We will get records sent from your previous gastroenterologist [facility]) for review.  This will include any endoscopic (colonoscopy or upper endoscopy) procedures and any associated pathology reports.   Start miralax once daily, in addition to your fiber. Consider zantac or pepcid on nights when you eat tomato based foods.  Normal BMI (Body Mass Index- based on height and weight) is between 19 and 25. Your BMI today is Body mass index is 27.21 kg/m. Marland Kitchen Please consider follow up  regarding your BMI with your Primary Care Provider.

## 2018-03-11 DIAGNOSIS — M542 Cervicalgia: Secondary | ICD-10-CM | POA: Diagnosis not present

## 2018-03-11 DIAGNOSIS — M79605 Pain in left leg: Secondary | ICD-10-CM | POA: Diagnosis not present

## 2018-03-11 DIAGNOSIS — M4802 Spinal stenosis, cervical region: Secondary | ICD-10-CM | POA: Diagnosis not present

## 2018-03-11 DIAGNOSIS — M545 Low back pain: Secondary | ICD-10-CM | POA: Diagnosis not present

## 2018-03-12 ENCOUNTER — Telehealth (INDEPENDENT_AMBULATORY_CARE_PROVIDER_SITE_OTHER): Payer: Self-pay | Admitting: *Deleted

## 2018-03-12 NOTE — Telephone Encounter (Signed)
Called pt and left vm #1 to schedule OV for MRI review. MRI is scheduled for 03/18/18

## 2018-03-13 ENCOUNTER — Other Ambulatory Visit: Payer: Self-pay

## 2018-03-13 ENCOUNTER — Telehealth: Payer: Self-pay | Admitting: Gastroenterology

## 2018-03-13 DIAGNOSIS — G43009 Migraine without aura, not intractable, without status migrainosus: Secondary | ICD-10-CM

## 2018-03-13 NOTE — Telephone Encounter (Signed)
Referral placed.

## 2018-03-13 NOTE — Telephone Encounter (Signed)
  EGD October 2014 Dr. Clarene Reamer cough, [place].  Indications "reflux like dyspepsia".  Findings of hiatal hernia, "moderately severe acute gastritis" polyp was found in the body the stomach which was biopsied.  Pathology showed no H. pylori and that the polyp in the stomach was a fundic gland polyp  Colonoscopy October 2014 Dr. left golf, [place].  Indications "average risk screening for colon cancer and colon polyps" findings moderately severe diverticulosis in the sigmoid colon otherwise normal examination without any polyps removed.     Can you please call him and let him know I reviewed his colonoscopy and upper endoscopy reports from October 2014.  He needs repeat screening colonoscopy October 2024.  Please put him in our recall system for that.

## 2018-03-13 NOTE — Progress Notes (Signed)
er

## 2018-03-13 NOTE — Telephone Encounter (Signed)
Recall in Epic Left message on machine to call back

## 2018-03-14 NOTE — Telephone Encounter (Signed)
The patient has been notified of this information and all questions answered.

## 2018-03-15 ENCOUNTER — Encounter: Payer: Self-pay | Admitting: Family Medicine

## 2018-03-17 ENCOUNTER — Other Ambulatory Visit: Payer: Self-pay | Admitting: Surgical

## 2018-03-17 DIAGNOSIS — M4802 Spinal stenosis, cervical region: Secondary | ICD-10-CM | POA: Diagnosis not present

## 2018-03-17 DIAGNOSIS — M545 Low back pain: Secondary | ICD-10-CM | POA: Diagnosis not present

## 2018-03-17 DIAGNOSIS — M79605 Pain in left leg: Secondary | ICD-10-CM | POA: Diagnosis not present

## 2018-03-17 DIAGNOSIS — M542 Cervicalgia: Secondary | ICD-10-CM | POA: Diagnosis not present

## 2018-03-17 MED ORDER — ESOMEPRAZOLE MAGNESIUM 40 MG PO CPDR: 40.0000 mg | DELAYED_RELEASE_CAPSULE | Freq: Every day | ORAL | 2 refills | Status: DC

## 2018-03-18 ENCOUNTER — Ambulatory Visit
Admission: RE | Admit: 2018-03-18 | Discharge: 2018-03-18 | Disposition: A | Discharge status: Home / Self Care | Payer: 59 | Source: Ambulatory Visit | Attending: Physical Medicine and Rehabilitation | Admitting: Physical Medicine and Rehabilitation

## 2018-03-18 DIAGNOSIS — M545 Low back pain: Secondary | ICD-10-CM | POA: Diagnosis not present

## 2018-03-18 DIAGNOSIS — M5416 Radiculopathy, lumbar region: Secondary | ICD-10-CM

## 2018-03-18 DIAGNOSIS — M5126 Other intervertebral disc displacement, lumbar region: Secondary | ICD-10-CM | POA: Diagnosis not present

## 2018-03-18 DIAGNOSIS — M5185 Other intervertebral disc disorders, thoracolumbar region: Secondary | ICD-10-CM | POA: Diagnosis not present

## 2018-03-19 ENCOUNTER — Encounter (INDEPENDENT_AMBULATORY_CARE_PROVIDER_SITE_OTHER): Payer: Self-pay | Admitting: Physical Medicine and Rehabilitation

## 2018-03-19 NOTE — Progress Notes (Signed)
Michael Garcia - 62 y.o. male MRN [id number]  Date of birth: 1956/03/12  Office Visit Note: Visit Date: 03/04/2018 PCP: Briscoe Deutscher, DO Referred by: Briscoe Deutscher, DO  Subjective: Chief Complaint  Patient presents with  . Neck - Pain  . Lower Back - Pain  . Left Lower Leg - Pain  . Left Hip - Pain   HPI: Michael Garcia is a 62 year old gentleman who now lives in [place] but has been in [place] for the last several years and was getting his medical care at the [facility].  Luckily we are able to review his notes which are fairly extensive.  He comes in today at the request of Dr. Briscoe Deutscher for evaluation and management of his headaches and upper back pain and really lower back pain.  Interestingly when he saw her his neck pain and migraines are really much more of an issue but today he really talks about his low back pain is being the worst issue.  In terms of his low back pain it is more left-sided and it radiates around to his left groin.  He gets a sharp pain into the left buttock and sometimes it will radiate down into the ankle.  This seems to be a mixed picture of radicular or possibly musculoskeletal type pain referral patterns.  He does get some right buttock pain as well.  He says he is not having much pain relief today when he does get the pain he actually has difficulty putting weight on his legs and has a severe amount of pain at that point and then it will kind of come and go.  He denies any numbness and tingling down the legs.  He says walking actually helps.  Bending forward or lying down actually increases the pain.  He has not really had much work done on his lower back recently.  He has not had any lumbar surgery or advanced imaging.  He has not had any trauma.  The notes from [place] showed mainly treatments for his occipital neuralgia and headache type pain.  He does have a prior history of a C6-7 ACDF.  This was completed in 2010 on relief.  He has been seeing  a pain management physician physician, anesthesiologist and they have been completing pain injections for migraine type issues with occipital blocks.  From a standpoint of his upper back and neck pain he does get pain across the shoulders and neck bilaterally with a lot of stiffness.  He reports that radiates up to his head and sometimes will get migraine headaches and he feels like originate from his neck.  He has had Botox injections in the past as well for migraines.  He continues to take Maxalt.  He also takes meloxicam.  He has history is complicated by history of bipolar disorder which is mainly depressive disorder.  He is on Lamictal and he does take some Xanax as needed.  He is on bupropion.  He has been using some Flexeril.  He has tried other muscle relaxers.  In terms of imaging he has not had a lumbar spine MRI.  He has had x-rays performed in [place] but I cannot see those images and I cannot review the reports.  We did get AP and lateral cervical and lumbar spine x-rays.  These are reviewed with the patient and reviewed below.   Review of Systems  Constitutional: Positive for malaise/fatigue. Negative for chills, fever and weight loss.  HENT: Negative for hearing  loss and sinus pain.   Eyes: Negative for blurred vision, double vision and photophobia.  Respiratory: Negative for cough and shortness of breath.   Cardiovascular: Negative for chest pain, palpitations and leg swelling.  Gastrointestinal: Negative for abdominal pain, nausea and vomiting.  Genitourinary: Negative for flank pain.  Musculoskeletal: Positive for back pain, joint pain and neck pain. Negative for myalgias.  Skin: Negative for itching and rash.  Neurological: Positive for headaches. Negative for tingling, tremors, focal weakness and weakness.  Endo/Heme/Allergies: Negative.   Psychiatric/Behavioral: Negative for depression.  All other systems reviewed and are negative.  Otherwise per HPI.  Assessment & Plan: Visit  Diagnoses:  1. Neck pain   2. S/P cervical spinal fusion   3. Spinal stenosis of cervical region   4. Low back pain, unspecified back pain laterality, unspecified chronicity, with sciatica presence unspecified   5. Lumbar radicular pain     Plan: Findings:  History of chronic neck and upper back pain and some low back pain now with worsening left radicular pain into the buttocks and referring into the groin.  He has no pain with hip rotation and I do think this is more related to his lumbar spine.  He actually gets a significant amount of pain with weightbearing but it is sporadic.  He denies any numbness or tingling or other red flag complaints.  He has not had any weight loss.  The fact that he is having such a bit of pain down the leg and into the groin without really findings on exam I think it would be wise to get a lumbar spine MRI just to characterize his lumbar spine.  He has had prior cervical ACDF.  He will continue with the meloxicam and Flexeril.  Consideration would be given to epidural injection versus regrouping with a physical therapist for other medication management.  From a standpoint of his neck pain as well as headaches I think he is probably having chronic migraines and one source of initiating the migraine may be cervicogenic.  He does have some arthritis in the upper levels of the cervical spine.  We could look at third occipital nerve blocks bilaterally to see if that helps and then look at possibly radiofrequency ablation.  I think it would also be wise to have him see a neurologist who specializes in migraine headaches.  2 doctors that we have used in the past is  Dr. Evelena Asa in [place].  This may be a far drive for him as he does have to travel to [place] where  works as a Clinical biochemist at [employer].  Another choice might be Dr. Floyde Parkins at [facility] neurology.  They were both have more experience in current medication and other management techniques for chronic  migraine and I would.  I would be happy to look at potential occipital blocks or upper cervical blocks depending on his course of treatment.  He does have myofascial pain as well with trigger points in the levator scapula trapezius and supraspinatus.  I do think he would do well with dry needling and myofascial release.  I am going to make a referral to [facility] physical therapy.    Meds & Orders: No orders of the defined types were placed in this encounter.   Orders Placed This Encounter  Procedures  . XR Lumbar Spine 2-3 Views  . XR Cervical Spine 2 or 3 views  . MR LUMBAR SPINE WO CONTRAST    Follow-up: Return for MRI review after completion.  Procedures: No procedures performed  No notes on file   Clinical History: Edi, Rad Results In - 01/16/2017  9:50 AM EST  Exam: MRI C-spine without contrast  IMAGING TECHNIQUE: Multiplanar multisequence MRI images of the cervical spine was obtained WITHOUT IV contrast.  INDICATION: Neck pain.  COMPARISON: Cervical spine MRI 04/01/2014.  OBSERVATIONS:  Exam: MRI C-spine without contrast  IMAGING TECHNIQUE: Multiplanar multisequence MRI images of the cervical spine was obtained without IV contrast.  INDICATION: Neck pain  COMPARISON: None  OBSERVATIONS:  Redemonstration of the ACDF at C6-C7.   The vertebral bodies are well aligned, demonstrating preserved height. No significant spinal listhesis. No acute fractures or suspicious bone marrow lesions.  The cervicomedullary junction is unremarkable. The visualized spinal cord is normal in signal and caliber without compression.   Mild congenital spinal canal stenosis most prominent from C3 through C6.   Mild disc space narrowing from C3 through C6 and C7-T1.  The pre and paravertebral soft tissues are unremarkable.  C2-C3: Mild congenital canal stenosis, but no neuroforaminal narrowing. C3-C4: Disc bulge, resulting mild spinal canal stenosis and uncovertebral arthrosis,  resulting in moderate left neuroforaminal narrowing. C4-C5: Disc bulge, resulting mild spinal canal stenosis and mild bilateral neural foraminal narrowing. C5-C6: Disc bulge and posterior arch hypertrophy, resulting mild to moderate canal stenosis and bilateral neuroforaminal narrowing, moderate to severe on the right and moderate on the left.  C6-C7: No significant canal stenosis or neuroforaminal narrowing. C7-T1: Disc bulge, resulting mild sac compression, but no significant canal stenosis.  The imaged pre and paravertebral soft tissues look unremarkable.  Marland Kitchen IMPRESSION:   1. Redemonstration of ACDF at C6-C7.  2. Mild congenital spinal canal stenosis aggravated by mild to moderate cervical spondylosis, more pronounced at C5-C6, not significantly changed compared to prior   He reports that he quit smoking about 25 years ago. His smoking use included cigarettes. He has never used smokeless tobacco.  Recent Labs    10/23/17  HGBA1C 5.3    Objective:  VS:  HT:    WT:   BMI:     BP:117/74  HR:76bpm  TEMP: ( )  RESP:  Physical Exam  Constitutional: He is oriented to person, place, and time. He appears well-developed and well-nourished. No distress.  HENT:  Head: Normocephalic and atraumatic.  Nose: Nose normal.  Mouth/Throat: Oropharynx is clear and moist.  Eyes: Pupils are equal, round, and reactive to light. Conjunctivae are normal.  Neck: Neck supple. No JVD present. No tracheal deviation present.  Cardiovascular: Regular rhythm and intact distal pulses.  Pulmonary/Chest: Effort normal and breath sounds normal.  Abdominal: Soft. He exhibits no distension. There is no rebound and no guarding.  Musculoskeletal: He exhibits no edema or deformity.  Examination of the cervical spine shows that the patient sits with a forward flexed cervical spine.  He has active trigger points in the levator scapula paraspinal musculature and supraspinatus bilaterally.  These do reproduce some of  his upper back pain.  He has very mild shoulder impingement signs bilaterally.  He has good strength in the upper extremities bilaterally without any deficits.  He has 2+ muscle stretch reflexes at the biceps and brachioradialis.  He has a negative Hoffman's test.  Examination of his lumbar spine shows negative slump test bilaterally.  He has good distal strength without clonus.  He has no pain with hip rotation and his hips move freely bilaterally.  This does not reproduce the groin pain that he feels intermittently.  Neurological: He is  alert and oriented to person, place, and time. He exhibits normal muscle tone. Coordination normal.  Skin: Skin is warm. No rash noted.  Psychiatric: He has a normal mood and affect. His behavior is normal.  Nursing note and vitals reviewed.   Ortho Exam Imaging:  Past Medical/Family/Surgical/Social History: Medications & Allergies reviewed per EMR, new medications updated. Patient Active Problem List   Diagnosis Date Noted  . Anemia 11/21/2017  . Benign non-nodular prostatic hyperplasia with lower urinary tract symptoms 11/21/2017  . Erectile disorder, acquired, generalized, severe 11/21/2017  . Gastro-esophageal reflux disease with esophagitis 11/21/2017  . Lichen planus 70/62/3762  . Lipid disorder 11/21/2017  . Lumbar radicular pain 11/21/2017  . Nonintractable migraine, unspecified migraine type 11/21/2017  . Thyroid nodule 11/21/2017  . Glaucoma 11/21/2017  . Chronic myofascial pain 11/21/2017  . Bipolar disorder with depression ([place]) 11/21/2017  . Peyronie disease 11/12/2016  . Incomplete right bundle branch block 10/18/2016  . GAD (generalized anxiety disorder) 03/29/2016  . Insomnia, persistent 03/29/2016  . MDD (major depressive disorder), recurrent episode, moderate ([place]) 03/29/2016  . Nondependent alcohol abuse, in remission 03/29/2016  . Cervical spondylosis without myelopathy 04/29/2014  . Degenerative disc disease, cervical 04/29/2014    . Status post cervical spinal fusion 04/29/2014  . Cervical stenosis of spinal canal 11/12/2008   Past Medical History:  Diagnosis Date  . Alcoholism ([place])   . Anemia 11/21/2017  . Anxiety   . Arthritis   . Benign non-nodular prostatic hyperplasia with lower urinary tract symptoms 11/21/2017  . Bilateral arm pain 04/29/2014  . Bipolar disorder with depression ([place]) 11/21/2017  . Cervical spondylosis without myelopathy 04/29/2014  . Cervical stenosis of spinal canal 11/12/2008  . Chronic headaches   . Chronic myofascial pain 11/21/2017  . Constipation 11/21/2017  . Degenerative disc disease, cervical 04/29/2014  . Depression   . Erectile disorder, acquired, generalized, severe 11/21/2017  . GAD (generalized anxiety disorder) 03/29/2016  . Gastro-esophageal reflux disease with esophagitis 11/21/2017  . Glaucoma 11/21/2017  . Incomplete right bundle branch block 10/18/2016  . Insomnia, persistent 03/29/2016  . Lichen planus, penis 11/21/2017  . Lipid disorder 11/21/2017  . Lumbar radicular pain 11/21/2017  . Nondependent alcohol abuse, in remission 03/29/2016  . Nonintractable migraine, unspecified migraine type 11/21/2017  . Peyronie disease 11/12/2016  . Status post cervical spinal fusion 04/29/2014  . Thyroid nodule 11/21/2017   Family History  Problem Relation Age of Onset  . Heart disease Mother   . Irritable bowel syndrome Mother   . Diabetes Sister    Past Surgical History:  Procedure Laterality Date  . CERVICAL SPINE SURGERY  2010   c6-c7 fusion  . HERNIA REPAIR     Social History   Occupational History  . Occupation: Scientist, physiological: [employer]  Tobacco Use  . Smoking status: Former Smoker    Types: Cigarettes    Last attempt to quit: 1994    Years since quitting: 25.3  . Smokeless tobacco: Never Used  Substance and Sexual Activity  . Alcohol use: No    Frequency: Never  . Drug use: No  . Sexual activity: Not on file

## 2018-03-20 ENCOUNTER — Encounter (INDEPENDENT_AMBULATORY_CARE_PROVIDER_SITE_OTHER): Payer: Self-pay | Admitting: Physical Medicine and Rehabilitation

## 2018-03-20 ENCOUNTER — Ambulatory Visit (INDEPENDENT_AMBULATORY_CARE_PROVIDER_SITE_OTHER): Payer: 59 | Admitting: Physical Medicine and Rehabilitation

## 2018-03-20 VITALS — BP 118/69 | HR 70

## 2018-03-20 DIAGNOSIS — G4489 Other headache syndrome: Secondary | ICD-10-CM | POA: Diagnosis not present

## 2018-03-20 DIAGNOSIS — C720 Malignant neoplasm of spinal cord: Secondary | ICD-10-CM | POA: Diagnosis not present

## 2018-03-20 DIAGNOSIS — Z981 Arthrodesis status: Secondary | ICD-10-CM

## 2018-03-20 DIAGNOSIS — M542 Cervicalgia: Secondary | ICD-10-CM | POA: Diagnosis not present

## 2018-03-20 DIAGNOSIS — M5416 Radiculopathy, lumbar region: Secondary | ICD-10-CM

## 2018-03-20 NOTE — Progress Notes (Signed)
.  Numeric Pain Rating Scale and Functional Assessment Average Pain 0   In the last MONTH (on 0-10 scale) has pain interfered with the following?  1. General activity like being  able to carry out your everyday physical activities such as walking, climbing stairs, carrying groceries, or moving a chair?  Rating(0)    

## 2018-03-21 NOTE — Progress Notes (Signed)
Michael Garcia - 62 y.o. male MRN [id number]  Date of birth: May 27, 1956  Office Visit Note: Visit Date: 03/20/2018 PCP: Briscoe Deutscher, DO Referred by: Briscoe Deutscher, DO  Subjective: Chief Complaint  Patient presents with  . Neck - Pain   HPI: Michael Garcia is a 62 year old gentleman that we recently saw at the request of Dr. Briscoe Deutscher for evaluation management of mainly neck pain and headache however, when I saw him he was having more back pain and lefts.  Low back and pain that referred actually into the groin area and more of a T12 or L1 distribution but also some down the leg.  At that time we suggested referral to a neurologist for evaluation of his headaches.  He has been taking Maxalt with diagnosis of migraine headache.  He had been seeing a pain management person in [place] and received occipital nerve blocks.  For his low back we decided to try physical therapy but also going to get an MRI of his lower back because of the symptoms of radicular pain and the fact that he had not had a prior MRI of his lower back.  His exam was really nonfocal and neurologically intact.  He comes in today because we did get an MRI of his lower back with results that are shown below.  Basically this shows an intradural mass at the level of the conus have at the T12-L1 level taking up quite a bit of space in the canal.  The report states that this is thought to be an ependymoma but cannot rule out schwannoma or metastatic disease.  The patient had a prior cervical MRI in [place] and we have the report.  This was 1 year ago in March.  There were no metastatic disease or other tumors noted on that exam.  He is also been followed fairly closely in [place] for multiple thyroid nodules and has had those biopsied.  He has had no changes since I saw him last except for the fact that his back pain is actually improved and he says that can happen at times.  He does get some pain with coughing or weightbearing and intermittently  he will get very sharp and severe pain.  Otherwise she is normally pretty functional.  We discussed the results with him today and we are going to make referral to a neurosurgeon for evaluation.  I did discuss this with our spine surgeon Dr. Louanne Skye.  He suggested Dr. Ellene Route.  We will make referral to Dr. Ellene Route.  The report also suggested repeat MRI with contrast to further characterize the lesion.  He also mentioned potential survey of the rest of the spine.  Since the patient has already had MRI of the cervical spine 1 year ago we are going to order just a repeat lumbar spine with contrast.  However, because of the patient's ongoing headaches and the fact that he has not had a brain MRI we are going to go ahead and order that as well.  The patient and I did talk about the cost involved in getting the MRIs, he is going to look at the cost of both MRIs.  Obviously the lumbar spine repeat is needed and he understands this.  He has an appointment coming up with Dr. Floyde Parkins for his headaches.  Depending on getting the MRI of his brain or not this will be discussed with Dr. Jannifer Franklin.  Again exam is unchanged and nonfocal and he is neurologically intact.  Patient has had  no new changes in symptom has had no unexplained weight loss or fevers or chills or night sweats or specific night pain.   ROS Otherwise per HPI.  Assessment & Plan: Visit Diagnoses:  1. Ependymoma of spinal cord (HCC)   2. Other headache syndrome   3. Neck pain   4. Lumbar radicular pain   5. S/P cervical spinal fusion     Plan: Findings:  Plan as above in the history of present illness.  Obtain MRI of the lumbar spine with contrast.  Referral to Dr. Ellene Route for evaluation of the suspected ependymoma and ordered brain MRI given the fact that the patient has this new finding and has had intractable headaches for some time without prior brain MRI.  Patient will follow-up with Dr. Jannifer Franklin for consultation is already scheduled for his  headaches.    Meds & Orders: No orders of the defined types were placed in this encounter.   Orders Placed This Encounter  Procedures  . MR Lumbar Spine W Wo Contrast  . MR BRAIN W WO CONTRAST  . Ambulatory referral to Neurosurgery    Follow-up: No follow-ups on file.   Procedures: No procedures performed  No notes on file   Clinical History: MRI LUMBAR SPINE WITHOUT CONTRAST  TECHNIQUE: Multiplanar, multisequence MR imaging of the lumbar spine was performed. No intravenous contrast was administered.  COMPARISON:  None.  FINDINGS: Segmentation:  5 lumbar type vertebral bodies.  Alignment:  Normal  Vertebrae:  Normal  Conus medullaris and cauda equina: Conus extends to the T12 level. There is an intradural mass at the T12-L1 level extending over a length of 2.4 cm with a transverse diameter of 1.7 cm, filling the spinal canal and displacing the neural structures anteriorly and towards the right. This is probably an ependymoma. Differential diagnosis includes neurofibroma/schwannoma and metastasis.  Paraspinal and other soft tissues: No paraspinal soft tissue lesion is seen. Tissue in the left upper quadrant assumed to represent the spleen.  Disc levels:  No disc pathology at T11-12, T12-L1 or L1-2.  L2-3: Left posterolateral disc herniation with slight caudal migration behind L3. Stenosis of the left lateral recess which could cause left-sided neural compression.  L3-4: Disc bulge.  No compressive stenosis.  L4-5: Chronic disc degeneration with loss of disc height, endplate osteophytes and mild bulging of the disc. Mild facet and ligamentous hypertrophy. Mild stenosis of the lateral recesses but without likely neural compression.  L5-S1: Mild bulging of the disc. Mild facet degeneration. No stenosis.  IMPRESSION: 2.4 x 1.7 x 1.7 cm intradural extramedullary mass at the T12-L1 level which is the level of the distal cord/conus/cauda.  Neural structures are compressed anteriorly and towards the right in the spinal canal. Most likely diagnosis is ependymoma, though schwannoma and metastatic disease are considered. I would suggest postcontrast imaging to further characterize this abnormality. One might also consider screening the rest of the spinal canal.  Left posterolateral disc herniation at L2-3 with caudal migration behind L3, which could cause neural compression on the left.  Chronic, probably non-compressive degenerative changes L3-4, L4-5 and L5-S1.   Electronically Signed   By: Nelson Chimes M.D.   On: 03/19/2018 07:12    Exam: MRI C-spine without contrast 01/16/2017  - [place]  IMAGING TECHNIQUE: Multiplanar multisequence MRI images of the cervical spine was obtained WITHOUT IV contrast.  INDICATION: Neck pain.  COMPARISON: Cervical spine MRI 04/01/2014.  OBSERVATIONS:  Exam: MRI C-spine without contrast  IMAGING TECHNIQUE: Multiplanar multisequence MRI images of the  cervical spine was obtained without IV contrast.  INDICATION: Neck pain  COMPARISON: None  OBSERVATIONS:  Redemonstration of the ACDF at C6-C7.   The vertebral bodies are well aligned, demonstrating preserved height. No significant spinal listhesis. No acute fractures or suspicious bone marrow lesions.  The cervicomedullary junction is unremarkable. The visualized spinal cord is normal in signal and caliber without compression.   Mild congenital spinal canal stenosis most prominent from C3 through C6.   Mild disc space narrowing from C3 through C6 and C7-T1.  The pre and paravertebral soft tissues are unremarkable.  C2-C3: Mild congenital canal stenosis, but no neuroforaminal narrowing. C3-C4: Disc bulge, resulting mild spinal canal stenosis and uncovertebral arthrosis, resulting in moderate left neuroforaminal narrowing. C4-C5: Disc bulge, resulting mild spinal canal stenosis and mild bilateral neural foraminal  narrowing. C5-C6: Disc bulge and posterior arch hypertrophy, resulting mild to moderate canal stenosis and bilateral neuroforaminal narrowing, moderate to severe on the right and moderate on the left.  C6-C7: No significant canal stenosis or neuroforaminal narrowing. C7-T1: Disc bulge, resulting mild sac compression, but no significant canal stenosis.  The imaged pre and paravertebral soft tissues look unremarkable.  Marland Kitchen IMPRESSION:   1. Redemonstration of ACDF at C6-C7.  2. Mild congenital spinal canal stenosis aggravated by mild to moderate cervical spondylosis, more pronounced at C5-C6, not significantly changed compared to prior   He reports that he quit smoking about 25 years ago. His smoking use included cigarettes. He has never used smokeless tobacco.  Recent Labs    10/23/17  HGBA1C 5.3    Objective:  VS:  HT:    WT:   BMI:     BP:118/69  HR:70bpm  TEMP: ( )  RESP:  Physical Exam  Ortho Exam Imaging: No results found.  Past Medical/Family/Surgical/Social History: Medications & Allergies reviewed per EMR, new medications updated. Patient Active Problem List   Diagnosis Date Noted  . Anemia 11/21/2017  . Benign non-nodular prostatic hyperplasia with lower urinary tract symptoms 11/21/2017  . Erectile disorder, acquired, generalized, severe 11/21/2017  . Gastro-esophageal reflux disease with esophagitis 11/21/2017  . Lichen planus 78/29/5621  . Lipid disorder 11/21/2017  . Lumbar radicular pain 11/21/2017  . Nonintractable migraine, unspecified migraine type 11/21/2017  . Thyroid nodule 11/21/2017  . Glaucoma 11/21/2017  . Chronic myofascial pain 11/21/2017  . Bipolar disorder with depression ([place]) 11/21/2017  . Peyronie disease 11/12/2016  . Incomplete right bundle branch block 10/18/2016  . GAD (generalized anxiety disorder) 03/29/2016  . Insomnia, persistent 03/29/2016  . MDD (major depressive disorder), recurrent episode, moderate ([place]) 03/29/2016  .  Nondependent alcohol abuse, in remission 03/29/2016  . Cervical spondylosis without myelopathy 04/29/2014  . Degenerative disc disease, cervical 04/29/2014  . Status post cervical spinal fusion 04/29/2014  . Cervical stenosis of spinal canal 11/12/2008   Past Medical History:  Diagnosis Date  . Alcoholism ([place])   . Anemia 11/21/2017  . Anxiety   . Arthritis   . Benign non-nodular prostatic hyperplasia with lower urinary tract symptoms 11/21/2017  . Bilateral arm pain 04/29/2014  . Bipolar disorder with depression ([place]) 11/21/2017  . Cervical spondylosis without myelopathy 04/29/2014  . Cervical stenosis of spinal canal 11/12/2008  . Chronic headaches   . Chronic myofascial pain 11/21/2017  . Constipation 11/21/2017  . Degenerative disc disease, cervical 04/29/2014  . Depression   . Erectile disorder, acquired, generalized, severe 11/21/2017  . GAD (generalized anxiety disorder) 03/29/2016  . Gastro-esophageal reflux disease with esophagitis 11/21/2017  . Glaucoma 11/21/2017  .  Incomplete right bundle branch block 10/18/2016  . Insomnia, persistent 03/29/2016  . Lichen planus, penis 11/21/2017  . Lipid disorder 11/21/2017  . Lumbar radicular pain 11/21/2017  . Nondependent alcohol abuse, in remission 03/29/2016  . Nonintractable migraine, unspecified migraine type 11/21/2017  . Peyronie disease 11/12/2016  . Status post cervical spinal fusion 04/29/2014  . Thyroid nodule 11/21/2017   Family History  Problem Relation Age of Onset  . Heart disease Mother   . Irritable bowel syndrome Mother   . Diabetes Sister    Past Surgical History:  Procedure Laterality Date  . CERVICAL SPINE SURGERY  2010   c6-c7 fusion  . HERNIA REPAIR     Social History   Occupational History  . Occupation: Scientist, physiological: [employer]  Tobacco Use  . Smoking status: Former Smoker    Types: Cigarettes    Last attempt to quit: 1994    Years since quitting: 25.3  . Smokeless tobacco: Never Used   Substance and Sexual Activity  . Alcohol use: No    Frequency: Never  . Drug use: No  . Sexual activity: Not on file

## 2018-03-24 ENCOUNTER — Other Ambulatory Visit: Payer: Self-pay | Admitting: Family Medicine

## 2018-03-24 NOTE — Telephone Encounter (Signed)
Please advise on refill.

## 2018-03-25 ENCOUNTER — Encounter (INDEPENDENT_AMBULATORY_CARE_PROVIDER_SITE_OTHER): Payer: Self-pay | Admitting: Physical Medicine and Rehabilitation

## 2018-03-26 ENCOUNTER — Other Ambulatory Visit (INDEPENDENT_AMBULATORY_CARE_PROVIDER_SITE_OTHER): Payer: Self-pay | Admitting: Physical Medicine and Rehabilitation

## 2018-03-26 DIAGNOSIS — M5416 Radiculopathy, lumbar region: Secondary | ICD-10-CM

## 2018-03-26 DIAGNOSIS — C720 Malignant neoplasm of spinal cord: Secondary | ICD-10-CM

## 2018-03-26 NOTE — Progress Notes (Signed)
[facility]. Per patient request, a referral to [phone]. The top three doctors who are receiving patients are:    C. Rory Vincente Poli Marvin

## 2018-03-28 ENCOUNTER — Ambulatory Visit (INDEPENDENT_AMBULATORY_CARE_PROVIDER_SITE_OTHER): Payer: 59 | Admitting: Physical Medicine and Rehabilitation

## 2018-04-03 ENCOUNTER — Ambulatory Visit
Admission: RE | Admit: 2018-04-03 | Discharge: 2018-04-03 | Disposition: A | Discharge status: Home / Self Care | Payer: 59 | Source: Ambulatory Visit | Attending: Physical Medicine and Rehabilitation | Admitting: Physical Medicine and Rehabilitation

## 2018-04-03 DIAGNOSIS — M5126 Other intervertebral disc displacement, lumbar region: Secondary | ICD-10-CM | POA: Insufficient documentation

## 2018-04-03 DIAGNOSIS — M48061 Spinal stenosis, lumbar region without neurogenic claudication: Secondary | ICD-10-CM | POA: Diagnosis not present

## 2018-04-03 DIAGNOSIS — C720 Malignant neoplasm of spinal cord: Secondary | ICD-10-CM | POA: Diagnosis not present

## 2018-04-03 DIAGNOSIS — G4489 Other headache syndrome: Secondary | ICD-10-CM | POA: Diagnosis not present

## 2018-04-03 DIAGNOSIS — M5136 Other intervertebral disc degeneration, lumbar region: Secondary | ICD-10-CM | POA: Diagnosis not present

## 2018-04-03 MED ORDER — GADOBENATE DIMEGLUMINE 529 MG/ML IV SOLN
20.0000 mL | Freq: Once | INTRAVENOUS | Status: AC | PRN
  Administered 2018-04-03: 20 mL via INTRAVENOUS

## 2018-04-07 ENCOUNTER — Encounter (INDEPENDENT_AMBULATORY_CARE_PROVIDER_SITE_OTHER): Payer: Self-pay | Admitting: Physical Medicine and Rehabilitation

## 2018-04-09 ENCOUNTER — Encounter (INDEPENDENT_AMBULATORY_CARE_PROVIDER_SITE_OTHER): Payer: Self-pay | Admitting: Physical Medicine and Rehabilitation

## 2018-04-17 ENCOUNTER — Encounter: Payer: Self-pay | Admitting: Neurology

## 2018-04-17 ENCOUNTER — Ambulatory Visit: Payer: 59 | Admitting: Neurology

## 2018-04-17 ENCOUNTER — Telehealth: Payer: Self-pay | Admitting: Family Medicine

## 2018-04-17 VITALS — BP 120/72 | HR 85 | Ht 76.0 in | Wt 226.5 lb

## 2018-04-17 DIAGNOSIS — M5481 Occipital neuralgia: Secondary | ICD-10-CM

## 2018-04-17 DIAGNOSIS — G43C1 Periodic headache syndromes in child or adult, intractable: Secondary | ICD-10-CM | POA: Diagnosis not present

## 2018-04-17 DIAGNOSIS — M47812 Spondylosis without myelopathy or radiculopathy, cervical region: Secondary | ICD-10-CM

## 2018-04-17 MED ORDER — TAMSULOSIN HCL 0.4 MG PO CAPS: 0.4000 mg | ORAL_CAPSULE | Freq: Every day | ORAL | Status: DC

## 2018-04-17 MED ORDER — FINASTERIDE 5 MG PO TABS: 5.0000 mg | ORAL_TABLET | Freq: Every day | ORAL | Status: DC

## 2018-04-17 NOTE — Progress Notes (Signed)
Reason for visit: Headache  Referring physician: Dr. Claudius Sis is a 62 y.o. male  History of present illness:  Michael Garcia is a 62 year old right-handed white male with a history of cervical spine disease, he has had surgery at the C6-7 level previously.  The patient recently has moved from [place] to this area.  He has had a history of headaches that dates back about 8 years.  The patient indicates that he has chronic discomfort in the right shoulder area, occasionally the pain may go up into the neck and into the back of the head and projects forward behind the eye.  The patient will develop photophobia and phonophobia and some occasional nausea without vomiting.  The headaches usually last 4 or 5 hours but may last all day long at times.  He has recently gone on Emgality for the headache, he has been on this about 6 months.  His medication has resulted in good benefit with his headaches.  He has gone from having headaches about 13 days a month to now having only about 6 or 7 days with headache a month.  The patient in the past has been on gabapentin and Cymbalta, he is currently on trazodone, Flexeril, and Lamictal.  The patient will take Maxalt for the headache with good benefit.  The patient has been seen by Dr. Ernestina Patches, recent MRI of the lumbar spine was done showing a possible ependymoma at the conus medullaris.  The patient will be going to [name] for a surgical opinion regarding this.  The patient does have some difficulty voiding the bladder and he has problems with constipation.  He has some slight sensation of weakness of the left leg, he has not had any falls.  The patient denies numbness of the extremities.  He is sent to this office for evaluation of the headache.  Past Medical History:  Diagnosis Date  . Alcoholism ([place])   . Anemia 11/21/2017  . Anxiety   . Arthritis   . Benign non-nodular prostatic hyperplasia with lower urinary tract symptoms 11/21/2017    . Bilateral arm pain 04/29/2014  . Bipolar disorder with depression ([place]) 11/21/2017  . Cervical spondylosis without myelopathy 04/29/2014  . Cervical stenosis of spinal canal 11/12/2008  . Chronic headaches   . Chronic myofascial pain 11/21/2017  . Constipation 11/21/2017  . Degenerative disc disease, cervical 04/29/2014  . Depression   . Erectile disorder, acquired, generalized, severe 11/21/2017  . GAD (generalized anxiety disorder) 03/29/2016  . Gastro-esophageal reflux disease with esophagitis 11/21/2017  . Glaucoma 11/21/2017  . Incomplete right bundle branch block 10/18/2016  . Insomnia, persistent 03/29/2016  . Lichen planus, penis 11/21/2017  . Lipid disorder 11/21/2017  . Lumbar radicular pain 11/21/2017  . Nondependent alcohol abuse, in remission 03/29/2016  . Nonintractable migraine, unspecified migraine type 11/21/2017  . Peyronie disease 11/12/2016  . Status post cervical spinal fusion 04/29/2014  . Thyroid nodule 11/21/2017    Past Surgical History:  Procedure Laterality Date  . CERVICAL SPINE SURGERY  2010   c6-c7 fusion  . HERNIA REPAIR      Family History  Problem Relation Age of Onset  . Heart disease Mother   . Irritable bowel syndrome Mother   . Diabetes Sister     Social history:  reports that he quit smoking about 25 years ago. His smoking use included cigarettes. He has never used smokeless tobacco. He reports that he does not drink alcohol or use drugs.  Medications:  Prior to Admission medications   Medication Sig Start Date End Date Taking? Authorizing Provider  alfuzosin (UROXATRAL) 10 MG 24 hr tablet TAKE 1 TABLET BY MOUTH DAILY 03/24/18  Yes Briscoe Deutscher, DO  ALPRAZolam Duanne Moron) 1 MG tablet Take 1 tablet by mouth daily as needed. 05/18/15 04/24/18 Yes [provider]  buPROPion (WELLBUTRIN XL) 300 MG 24 hr tablet Take 1 tablet (300 mg total) by mouth daily. 01/15/18  Yes Briscoe Deutscher, DO  cetirizine (ZYRTEC) 10 MG tablet Take 1 tablet by mouth daily.    Yes [provider]  cyclobenzaprine (FLEXERIL) 10 MG tablet Take 1 tablet by mouth as needed. 06/07/15  Yes [provider]  esomeprazole (NEXIUM) 40 MG capsule Take 1 capsule (40 mg total) by mouth daily. 03/17/18  Yes Briscoe Deutscher, DO  FIBER PO Take by mouth.   Yes [provider]  fluticasone (FLONASE) 50 MCG/ACT nasal spray  09/30/16  Yes [provider]  Galcanezumab-gnlm (EMGALITY) 120 MG/ML SOAJ Inject 120 mg into the skin as needed. 01/15/18  Yes Briscoe Deutscher, DO  lamoTRIgine (LAMICTAL) 200 MG tablet TK 1 T PO D 01/15/18  Yes Briscoe Deutscher, DO  lidocaine (LIDODERM) 5 % USE AS DIRECTED ONCE DAILY EXTERNALLY 01/15/18  Yes Briscoe Deutscher, DO  Lidocaine 5 % CREA    Yes [provider]  Melatonin 3 MG TABS    Yes [provider]  meloxicam (MOBIC) 15 MG tablet Take 1 tablet (15 mg total) by mouth daily. 02/19/18  Yes Briscoe Deutscher, DO  MULTIPLE VITAMIN PO Take by mouth.   Yes [provider]  rizatriptan (MAXALT) 10 MG tablet Take 1 tablet (10 mg total) by mouth as needed for migraine. 01/15/18  Yes Briscoe Deutscher, DO  sildenafil (VIAGRA) 100 MG tablet    Yes [provider]  tacrolimus (PROTOPIC) 0.1 % ointment APPLY TO AFFECTED AREAS UTD 07/24/17  Yes [provider]  traZODone (DESYREL) 100 MG tablet Take 1 tablet (100 mg total) by mouth at bedtime as needed for sleep. 01/15/18 02/14/19 Yes Briscoe Deutscher, DO  finasteride (PROSCAR) 5 MG tablet Take 1 tablet (5 mg total) by mouth daily. 04/17/18   Kathrynn Ducking, MD  tamsulosin (FLOMAX) 0.4 MG CAPS capsule Take 1 capsule (0.4 mg total) by mouth daily. 04/17/18   Kathrynn Ducking, MD      Allergies  Allergen Reactions  . Bacitracin Itching and Rash  . Iodine Itching and Rash    IV Iodine   . Sulfamethoxazole-Trimethoprim Rash    ROS:  Out of a complete 14 system review of symptoms, the patient complains only of the following symptoms, and all other reviewed  systems are negative.  Joint pain Memory loss, headache Depression, anxiety, none of sleep Insomnia  Blood pressure 120/72, pulse 85, height 6\' 4"  (1.93 m), weight 226 lb 8 oz (102.7 kg).  Physical Exam  General: The patient is alert and cooperative at the time of the examination.  Eyes: Pupils are equal, round, and reactive to light. Discs are flat bilaterally.  Neck: The neck is supple, no carotid bruits are noted.  Respiratory: The respiratory examination is clear.  Cardiovascular: The cardiovascular examination reveals a regular rate and rhythm, no obvious murmurs or rubs are noted.  Neuromuscular: Range of movement the cervical spine is relatively full.  Skin: Extremities are without significant edema.  Neurologic Exam  Mental status: The patient is alert and oriented x 3 at the time of the examination. The patient has  apparent normal recent and remote memory, with an apparently normal attention span and concentration ability.  Cranial nerves: Facial symmetry is present. There is good sensation of the face to pinprick and soft touch bilaterally. The strength of the facial muscles and the muscles to head turning and shoulder shrug are normal bilaterally. Speech is well enunciated, no aphasia or dysarthria is noted. Extraocular movements are full. Visual fields are full. The tongue is midline, and the patient has symmetric elevation of the soft palate. No obvious hearing deficits are noted.  Motor: The motor testing reveals 5 over 5 strength of all 4 extremities. Good symmetric motor tone is noted throughout.  Sensory: Sensory testing is intact to pinprick, soft touch, vibration sensation, and position sense on all 4 extremities. No evidence of extinction is noted.  Coordination: Cerebellar testing reveals good finger-nose-finger and heel-to-shin bilaterally.  Gait and station: Gait is normal. Tandem gait is normal. Romberg is negative. No drift is seen.  Reflexes: Deep  tendon reflexes are symmetric and normal bilaterally. Toes are downgoing bilaterally.   MRI lumbar 04/03/18:  IMPRESSION: 1. Intensely contrast-enhancing tumor of the distal conus medullaris, most suggestive of myxopapillary ependymoma. Anterior and rightward displacement of the cauda equina nerve roots proximally is unchanged. 2. Multilevel degenerative disc disease with left lateral recess stenosis at L2-L3 and bilateral mild-to-moderate neural foraminal stenosis at L4-L5.  * MRI scan images were reviewed online. I agree with the written report.    MRI brain 04/03/18:  IMPRESSION: Normal brain MRI.   Assessment/Plan:  1.  Migraine headache, occipital nerve mediated  The patient appears to have true migraine that is mediated through the occipital nerve on the right side.  The patient has gained benefit with Emgality, this medication should be continued.  The patient will be given a series of occipital nerve blocks over the next several weeks to see if this improves the frequency of the headache further.  A nerve block was done today.  He will follow-up for revisit in 4 or 5 months.  It is possible that nerve blocks in the C3 area of the neck may also be of some benefit.  Jill Alexanders MD 04/17/2018 4:00 PM  [facility] [address] [place], [place] [address]  Phone [phone] Fax [phone]

## 2018-04-17 NOTE — Procedures (Signed)
     History: Michael Garcia is a 61-year-old gentleman with a history of intermittent headaches emanating from the right occipital area with projection to the retro-orbital area on the right. The patient is felt to have migraine headaches that are mediated to the occipital nerve. Occipital nerve blocks are being done to improve the headache.  Description of the procedure: The occipital nerve on the right was injected in 3 locations, the first at the midpoint between the mastoid process and the occiput and then 2 more injections superiorly at a 10 and 2o'clockposition. Dislocation received 1 cc of Marcaine, 0.5%.  The patient tolerated the procedure well. No complications were noted.  Medication use wasSensorcaine 0.5%  NDCis 63323-467-5 [phone]  Lot #[id number]  Expiration date September 2022 

## 2018-04-17 NOTE — Telephone Encounter (Signed)
The patient has a behavioral health appointment tomorrow at 10 am. They can not see him without a note. It needs to be faxed over before the appt.

## 2018-04-18 ENCOUNTER — Telehealth: Payer: Self-pay | Admitting: *Deleted

## 2018-04-18 DIAGNOSIS — F5105 Insomnia due to other mental disorder: Secondary | ICD-10-CM | POA: Diagnosis not present

## 2018-04-18 DIAGNOSIS — F331 Major depressive disorder, recurrent, moderate: Secondary | ICD-10-CM | POA: Diagnosis not present

## 2018-04-18 DIAGNOSIS — F411 Generalized anxiety disorder: Secondary | ICD-10-CM | POA: Diagnosis not present

## 2018-04-18 LAB — CBC AND DIFFERENTIAL
HEMATOCRIT: 38 — AB (ref 41–53)
HEMOGLOBIN: 12.7 — AB (ref 13.5–17.5)
Platelets: 219 (ref 150–399)
WBC: 4.9

## 2018-04-18 LAB — TSH: TSH: 3.66 (ref 0.41–5.90)

## 2018-04-18 LAB — HEPATIC FUNCTION PANEL
ALK PHOS: 60 (ref 25–125)
ALT: 14 (ref 10–40)
AST: 18 (ref 14–40)
BILIRUBIN, TOTAL: 0.3

## 2018-04-18 LAB — BASIC METABOLIC PANEL
BUN: 9 (ref 4–21)
Creatinine: 1.1 (ref 0.6–1.3)
GLUCOSE: 95
Potassium: 4.5 (ref 3.4–5.3)
Sodium: 142 (ref 137–147)

## 2018-04-18 LAB — VITAMIN B12: VITAMIN B 12: 277

## 2018-04-18 NOTE — Telephone Encounter (Signed)
Called pt. Scheduled work in appt for 6/14 at 12pm, check in 1130am for occipital nerve injection per Dr. Jannifer Franklin request. Pt verbalized understanding and appreciation.

## 2018-04-21 ENCOUNTER — Encounter (INDEPENDENT_AMBULATORY_CARE_PROVIDER_SITE_OTHER): Payer: Self-pay | Admitting: Physical Medicine and Rehabilitation

## 2018-04-21 DIAGNOSIS — G959 Disease of spinal cord, unspecified: Secondary | ICD-10-CM | POA: Diagnosis not present

## 2018-04-22 ENCOUNTER — Encounter: Payer: Self-pay | Admitting: Surgical

## 2018-04-22 DIAGNOSIS — G9589 Other specified diseases of spinal cord: Secondary | ICD-10-CM | POA: Insufficient documentation

## 2018-04-22 DIAGNOSIS — D334 Benign neoplasm of spinal cord: Secondary | ICD-10-CM | POA: Insufficient documentation

## 2018-04-25 ENCOUNTER — Encounter: Payer: Self-pay | Admitting: Neurology

## 2018-04-25 ENCOUNTER — Ambulatory Visit (INDEPENDENT_AMBULATORY_CARE_PROVIDER_SITE_OTHER): Payer: 59 | Admitting: Neurology

## 2018-04-25 VITALS — BP 116/69 | HR 79 | Ht 75.0 in | Wt 226.0 lb

## 2018-04-25 DIAGNOSIS — G43C1 Periodic headache syndromes in child or adult, intractable: Secondary | ICD-10-CM | POA: Diagnosis not present

## 2018-04-25 NOTE — Procedures (Signed)
   History: Michael Garcia is a 61-year-old gentleman with a history of intermittent headaches emanating from the right occipital area with projection to the retro-orbital area on the right. The patient is felt to have migraine headaches that are mediated to the occipital nerve. Occipital nerve blocks are being done to improve the headache.  Description of the procedure: The occipital nerve on the right was injected in 3 locations, the first at the midpoint between the mastoid process and the occiput and then 2 more injections superiorly at a 10 and 2o'clockposition. Dislocation received 1 cc of Marcaine, 0.5%.  The patient tolerated the procedure well. No complications were noted.  Medication use wasSensorcaine 0.5%  NDCis 63323-467-5 [phone]  Lot #[id number]  Expiration date September 2022 

## 2018-04-28 ENCOUNTER — Other Ambulatory Visit: Payer: Self-pay | Admitting: Family Medicine

## 2018-04-28 NOTE — Telephone Encounter (Signed)
Okay and give 2 refills.

## 2018-04-28 NOTE — Telephone Encounter (Signed)
Please advise on refill.

## 2018-04-30 DIAGNOSIS — G8929 Other chronic pain: Secondary | ICD-10-CM | POA: Diagnosis not present

## 2018-04-30 DIAGNOSIS — G959 Disease of spinal cord, unspecified: Secondary | ICD-10-CM | POA: Diagnosis not present

## 2018-04-30 DIAGNOSIS — M545 Low back pain: Secondary | ICD-10-CM | POA: Diagnosis not present

## 2018-05-02 ENCOUNTER — Ambulatory Visit (INDEPENDENT_AMBULATORY_CARE_PROVIDER_SITE_OTHER): Payer: 59 | Admitting: Neurology

## 2018-05-02 ENCOUNTER — Telehealth: Payer: Self-pay | Admitting: Family Medicine

## 2018-05-02 ENCOUNTER — Encounter: Payer: Self-pay | Admitting: Neurology

## 2018-05-02 VITALS — BP 125/66 | HR 79 | Ht 76.0 in | Wt 223.5 lb

## 2018-05-02 DIAGNOSIS — G43C1 Periodic headache syndromes in child or adult, intractable: Secondary | ICD-10-CM | POA: Diagnosis not present

## 2018-05-02 NOTE — Progress Notes (Signed)
Patient comes in for an occipital nerve injection.  The patient is being evaluated at [facility] for an ependymoma in the lower spine.  The patient has gained minimal if any benefit from the occipital nerve injections.  He is on Emgality, this has helped, the patient may require further medical therapy in the future, he will call our office if needed.

## 2018-05-02 NOTE — Telephone Encounter (Signed)
Copied from [place] [id number]. Topic: General - Other >> May 02, 2018 11:28 AM Yvette Rack wrote: Reason for CRM: Roselyn Reef with [employer] asking for status of prior authorization. Roselyn Reef states the reference key ([id number]) can be used when call is returned. Cb# [phone]

## 2018-05-02 NOTE — Procedures (Signed)
     History: Michael Garcia is a 62 year old gentleman with a history of intermittent headaches emanating from the right occipital area with projection to the retro-orbital area on the right. The patient is felt to have migraine headaches that are mediated to the occipital nerve. Occipital nerve blocks are being done to improve the headache.  Description of the procedure: The occipital nerve on the right was injected in 3 locations, the first at the midpoint between the mastoid process and the occiput and then 2 more injections superiorly at a 10 and 2o'clockposition. Dislocation received 1 cc of Marcaine, 0.5%.  The patient tolerated the procedure well. No complications were noted.  Medication use wasSensorcaine 0.5%  NDCis 21194-174-0 7  Lot #[id number]  Expiration date September 2022

## 2018-05-04 NOTE — Progress Notes (Signed)
Subjective:    Michael Garcia is a 62 y.o. male who presents today for his Complete Annual Exam.   Current Outpatient Medications:  .  alfuzosin (UROXATRAL) 10 MG 24 hr tablet, TAKE 1 TABLET BY MOUTH DAILY, Disp: 30 tablet, Rfl: 2 .  buPROPion (WELLBUTRIN XL) 300 MG 24 hr tablet, Take 1 tablet (300 mg total) by mouth daily., Disp: 90 tablet, Rfl: 1 .  cetirizine (ZYRTEC) 10 MG tablet, Take 1 tablet by mouth daily., Disp: , Rfl:  .  Cyanocobalamin (B-12 COMPLIANCE INJECTION) 1000 MCG/ML KIT, Inject 1,000 mcg as directed every 7 (seven) days. , Disp: , Rfl:  .  cyclobenzaprine (FLEXERIL) 10 MG tablet, Take 1 tablet by mouth as needed., Disp: , Rfl:  .  esomeprazole (NEXIUM) 40 MG capsule, Take 1 capsule (40 mg total) by mouth daily., Disp: 90 capsule, Rfl: 2 .  FIBER PO, Take by mouth., Disp: , Rfl:  .  finasteride (PROSCAR) 5 MG tablet, Take 1 tablet (5 mg total) by mouth daily., Disp: , Rfl:  .  fluticasone (FLONASE) 50 MCG/ACT nasal spray, , Disp: , Rfl:  .  Galcanezumab-gnlm (EMGALITY) 120 MG/ML SOAJ, Inject 120 mg into the skin as needed., Disp: 1 mL, Rfl: 3 .  lamoTRIgine (LAMICTAL) 200 MG tablet, TK 1 T PO D, Disp: 90 tablet, Rfl: 3 .  lidocaine (LIDODERM) 5 %, USE AS DIRECTED ONCE DAILY EXTERNALLY, Disp: 30 patch, Rfl: 2 .  Lidocaine 5 % CREA, , Disp: , Rfl:  .  Melatonin 3 MG TABS, , Disp: , Rfl:  .  meloxicam (MOBIC) 15 MG tablet, Take 1 tablet (15 mg total) by mouth daily., Disp: 30 tablet, Rfl: 0 .  MULTIPLE VITAMIN PO, Take by mouth., Disp: , Rfl:  .  rizatriptan (MAXALT) 10 MG tablet, Take 1 tablet (10 mg total) by mouth as needed for migraine., Disp: 10 tablet, Rfl: 1 .  sildenafil (VIAGRA) 100 MG tablet, , Disp: , Rfl:  .  tacrolimus (PROTOPIC) 0.1 % ointment, APPLY TO AFFECTED AREAS UTD, Disp: , Rfl:  .  traZODone (DESYREL) 100 MG tablet, Take 1 tablet (100 mg total) by mouth at bedtime as needed for sleep., Disp: 30 tablet, Rfl: 2  Health Maintenance Due  Topic Date  Due  . Hepatitis C Screening  1955/12/15  . HIV Screening  05/01/1973  . COLONOSCOPY  05/01/2008    PMHx, SurgHx, SocialHx, Medications, and Allergies were reviewed in the Visit Navigator and updated as appropriate.   Past Medical History:  Diagnosis Date  . Alcoholism ([place])   . Anemia 11/21/2017  . Anxiety   . Arthritis   . Benign non-nodular prostatic hyperplasia with lower urinary tract symptoms 11/21/2017  . Bilateral arm pain 04/29/2014  . Bipolar disorder with depression ([place]) 11/21/2017  . Cervical spondylosis without myelopathy 04/29/2014  . Cervical stenosis of spinal canal 11/12/2008  . Chronic headaches   . Chronic myofascial pain 11/21/2017  . Constipation 11/21/2017  . Degenerative disc disease, cervical 04/29/2014  . Depression   . Erectile disorder, acquired, generalized, severe 11/21/2017  . GAD (generalized anxiety disorder) 03/29/2016  . Gastro-esophageal reflux disease with esophagitis 11/21/2017  . Glaucoma 11/21/2017  . Incomplete right bundle branch block 10/18/2016  . Insomnia, persistent 03/29/2016  . Lichen planus, penis 11/21/2017  . Lipid disorder 11/21/2017  . Lumbar radicular pain 11/21/2017  . Nondependent alcohol abuse, in remission 03/29/2016  . Nonintractable migraine, unspecified migraine type 11/21/2017  . Peyronie disease 11/12/2016  . Status post  cervical spinal fusion 04/29/2014  . Thyroid nodule 11/21/2017   Past Surgical History:  Procedure Laterality Date  . CERVICAL SPINE SURGERY  2010   c6-c7 fusion  . HERNIA REPAIR     Family History  Problem Relation Age of Onset  . Heart disease Mother   . Irritable bowel syndrome Mother   . Diabetes Sister    Social History   Tobacco Use  . Smoking status: Former Smoker    Types: Cigarettes    Last attempt to quit: 1994    Years since quitting: 25.4  . Smokeless tobacco: Never Used  Substance Use Topics  . Alcohol use: No    Frequency: Never  . Drug use: No    Review of Systems:   Pertinent  items are noted in the HPI. Otherwise, ROS is negative.  Objective:   Vitals:   05/05/18 0700  BP: 124/82  Pulse: 82  Temp: 98.1 F (36.7 C)  SpO2: 98%   Body mass index is 27.17 kg/m.  General  Alert, cooperative, no distress, appears stated age  Head:  Normocephalic, without obvious abnormality, atraumatic  Eyes:  PERRL, conjunctiva/corneas clear, EOM's intact, fundi benign, both eyes       Ears:  Normal TM's and external ear canals, both ears  Nose: Nares normal, septum midline, mucosa normal, no drainage or sinus tenderness  Throat: Lips, mucosa, and tongue normal; teeth and gums normal  Neck: Supple, symmetrical, trachea midline, no adenopathy; thyroid: no enlargement/tenderness/nodules; no carotid bruit or JVD  Back:   Symmetric, no curvature, ROM normal, no CVA tenderness  Lungs:   Clear to auscultation bilaterally, respirations unlabored  Chest Wall:  No tenderness or deformity  Heart:  Regular rate and rhythm, S1 and S2 normal, no murmur, rub or gallop  Abdomen:   Soft, non-tender, bowel sounds active all four quadrants, no masses, no organomegaly  Extremities: Extremities normal, atraumatic, no cyanosis or edema  Prostate: Not done  Skin: Skin color, texture, turgor normal, no rashes or lesions  Lymph: Cervical, supraclavicular, and axillary nodes normal  Neurologic: CNII-XII grossly intact. Normal strength, sensation and reflexes throughout    AssessmentPlan:   Duncan was seen today for annual exam.  Diagnoses and all orders for this visit:  Routine physical examination  Anemia, unspecified type -     CBC with Differential/Platelet -     Cancel: Vitamin B12  Thyroid nodule -     TSH -     T4, free  Lipid disorder -     Comprehensive metabolic panel -     Lipid panel  Benign non-nodular prostatic hyperplasia with lower urinary tract symptoms -     PSA  Gastro-esophageal reflux disease with esophagitis  Vitamin D deficiency -     VITAMIN D 25  Hydroxy (Vit-D Deficiency, Fractures)  Encounter for hepatitis C screening test for low risk patient -     Hepatitis C antibody  Encounter for screening for HIV -     HIV antibody    Patient Counseling: '[x]'   Nutrition: Stressed importance of moderation in sodium/caffeine intake, saturated fat and cholesterol, caloric balance, sufficient intake of fresh fruits, vegetables, and fiber  '[x]'   Stressed the importance of regular exercise.   '[]'   Substance Abuse: Discussed cessation/primary prevention of tobacco, alcohol, or other drug use; driving or other dangerous activities under the influence; availability of treatment for abuse.   '[x]'   Injury prevention: Discussed safety belts, safety helmets, smoke detector, smoking near bedding or upholstery.   '[]'   Sexuality: Discussed sexually transmitted diseases, partner selection, use of condoms, avoidance of unintended pregnancy  and contraceptive alternatives.   '[x]'   Dental health: Discussed importance of regular tooth brushing, flossing, and dental visits.  '[x]'   Health maintenance and immunizations reviewed. Please refer to Health maintenance section.    Briscoe Deutscher, DO [place]

## 2018-05-05 ENCOUNTER — Ambulatory Visit (INDEPENDENT_AMBULATORY_CARE_PROVIDER_SITE_OTHER): Payer: 59 | Admitting: Family Medicine

## 2018-05-05 ENCOUNTER — Encounter: Payer: Self-pay | Admitting: Family Medicine

## 2018-05-05 VITALS — BP 124/82 | HR 82 | Temp 98.1°F | Ht 76.0 in | Wt 223.2 lb

## 2018-05-05 DIAGNOSIS — E041 Nontoxic single thyroid nodule: Secondary | ICD-10-CM | POA: Diagnosis not present

## 2018-05-05 DIAGNOSIS — E559 Vitamin D deficiency, unspecified: Secondary | ICD-10-CM

## 2018-05-05 DIAGNOSIS — D649 Anemia, unspecified: Secondary | ICD-10-CM | POA: Diagnosis not present

## 2018-05-05 DIAGNOSIS — Z0000 Encounter for general adult medical examination without abnormal findings: Secondary | ICD-10-CM | POA: Diagnosis not present

## 2018-05-05 DIAGNOSIS — K2100 Gastro-esophageal reflux disease with esophagitis, without bleeding: Secondary | ICD-10-CM

## 2018-05-05 DIAGNOSIS — Z1159 Encounter for screening for other viral diseases: Secondary | ICD-10-CM | POA: Diagnosis not present

## 2018-05-05 DIAGNOSIS — Z114 Encounter for screening for human immunodeficiency virus [HIV]: Secondary | ICD-10-CM

## 2018-05-05 DIAGNOSIS — N401 Enlarged prostate with lower urinary tract symptoms: Secondary | ICD-10-CM | POA: Diagnosis not present

## 2018-05-05 DIAGNOSIS — E789 Disorder of lipoprotein metabolism, unspecified: Secondary | ICD-10-CM | POA: Diagnosis not present

## 2018-05-05 LAB — COMPREHENSIVE METABOLIC PANEL
ALT: 14 U/L (ref 0–53)
AST: 14 U/L (ref 0–37)
Albumin: 4.6 g/dL (ref 3.5–5.2)
Alkaline Phosphatase: 54 U/L (ref 39–117)
BUN: 11 mg/dL (ref 6–23)
CO2: 30 mEq/L (ref 19–32)
Calcium: 9.3 mg/dL (ref 8.4–10.5)
Chloride: 103 mEq/L (ref 96–112)
Creatinine, Ser: 0.93 mg/dL (ref 0.40–1.50)
GFR: 88.09 mL/min (ref >60.00)
Glucose, Bld: 100 mg/dL — ABNORMAL HIGH (ref 70–99)
Potassium: 4 mEq/L (ref 3.5–5.1)
Sodium: 140 mEq/L (ref 135–145)
Total Bilirubin: 0.5 mg/dL (ref 0.2–1.2)
Total Protein: 6.9 g/dL (ref 6.0–8.3)

## 2018-05-05 LAB — CBC WITH DIFFERENTIAL/PLATELET
Basophils Absolute: 0 10*3/uL (ref 0.0–0.1)
Basophils Relative: 0.7 % (ref 0.0–3.0)
Eosinophils Absolute: 0.2 10*3/uL (ref 0.0–0.7)
Eosinophils Relative: 3.8 % (ref 0.0–5.0)
HCT: 39.2 % (ref 39.0–52.0)
Hemoglobin: 13.7 g/dL (ref 13.0–17.0)
Lymphocytes Relative: 16.8 % (ref 12.0–46.0)
Lymphs Abs: 1.1 10*3/uL (ref 0.7–4.0)
MCHC: 35 g/dL (ref 30.0–36.0)
MCV: 94 fl (ref 78.0–100.0)
Monocytes Absolute: 0.5 10*3/uL (ref 0.1–1.0)
Monocytes Relative: 7.7 % (ref 3.0–12.0)
Neutro Abs: 4.5 10*3/uL (ref 1.4–7.7)
Neutrophils Relative %: 71 % (ref 43.0–77.0)
Platelets: 210 10*3/uL (ref 150.0–400.0)
RBC: 4.17 Mil/uL — ABNORMAL LOW (ref 4.22–5.81)
RDW: 12.8 % (ref 11.5–15.5)
WBC: 6.3 10*3/uL (ref 4.0–10.5)

## 2018-05-05 LAB — VITAMIN D 25 HYDROXY (VIT D DEFICIENCY, FRACTURES): VITD: 31.75 ng/mL (ref 30.00–100.00)

## 2018-05-05 LAB — LIPID PANEL
Cholesterol: 164 mg/dL (ref 0–200)
HDL: 55.4 mg/dL (ref >39.00)
LDL Cholesterol: 91 mg/dL (ref 0–99)
NonHDL: 108.91
Total CHOL/HDL Ratio: 3
Triglycerides: 88 mg/dL (ref 0.0–149.0)
VLDL: 17.6 mg/dL (ref 0.0–40.0)

## 2018-05-05 LAB — PSA: PSA: 0.9 ng/mL (ref 0.10–4.00)

## 2018-05-05 LAB — T4, FREE: Free T4: 0.83 ng/dL (ref 0.60–1.60)

## 2018-05-05 LAB — TSH: TSH: 2.01 u[IU]/mL (ref 0.35–4.50)

## 2018-05-06 ENCOUNTER — Telehealth: Payer: Self-pay | Admitting: Family Medicine

## 2018-05-06 DIAGNOSIS — F331 Major depressive disorder, recurrent, moderate: Secondary | ICD-10-CM | POA: Diagnosis not present

## 2018-05-06 DIAGNOSIS — F5105 Insomnia due to other mental disorder: Secondary | ICD-10-CM | POA: Diagnosis not present

## 2018-05-06 DIAGNOSIS — F411 Generalized anxiety disorder: Secondary | ICD-10-CM | POA: Diagnosis not present

## 2018-05-06 LAB — HIV ANTIBODY (ROUTINE TESTING W REFLEX): HIV 1&2 Ab, 4th Generation: NONREACTIVE

## 2018-05-06 LAB — HEPATITIS C ANTIBODY
Hepatitis C Ab: NONREACTIVE
SIGNAL TO CUT-OFF: 0.04 (ref <1.00)

## 2018-05-06 NOTE — Telephone Encounter (Signed)
See note

## 2018-05-06 NOTE — Telephone Encounter (Signed)
Copied from [place] [id number]. Topic: General - Other >> May 06, 2018  3:03 PM Margot Ables wrote: Reason for CRM: Audelia Hives calling to offer assistance if needed for PA on Galcanezumab-gnlm [facility]) 120 MG/ML SOAJ. Please f/u as needed. Ref Key: [id number]

## 2018-05-06 NOTE — Telephone Encounter (Signed)
Should be received by neurology.

## 2018-05-07 ENCOUNTER — Ambulatory Visit: Payer: 59 | Admitting: Family Medicine

## 2018-05-08 NOTE — Telephone Encounter (Signed)
Should be done by neurology not our office.

## 2018-05-13 ENCOUNTER — Encounter: Payer: Self-pay | Admitting: Family Medicine

## 2018-05-13 ENCOUNTER — Ambulatory Visit: Payer: 59 | Admitting: Family Medicine

## 2018-05-13 VITALS — BP 120/78 | HR 80 | Temp 98.6°F | Ht 76.0 in | Wt 227.8 lb

## 2018-05-13 DIAGNOSIS — F331 Major depressive disorder, recurrent, moderate: Secondary | ICD-10-CM | POA: Diagnosis not present

## 2018-05-13 DIAGNOSIS — M7918 Myalgia, other site: Secondary | ICD-10-CM | POA: Diagnosis not present

## 2018-05-13 DIAGNOSIS — F411 Generalized anxiety disorder: Secondary | ICD-10-CM

## 2018-05-13 DIAGNOSIS — Z87891 Personal history of nicotine dependence: Secondary | ICD-10-CM | POA: Diagnosis not present

## 2018-05-13 DIAGNOSIS — Z23 Encounter for immunization: Secondary | ICD-10-CM

## 2018-05-13 DIAGNOSIS — G43C1 Periodic headache syndromes in child or adult, intractable: Secondary | ICD-10-CM | POA: Diagnosis not present

## 2018-05-13 DIAGNOSIS — G9589 Other specified diseases of spinal cord: Secondary | ICD-10-CM

## 2018-05-13 DIAGNOSIS — G959 Disease of spinal cord, unspecified: Secondary | ICD-10-CM | POA: Diagnosis not present

## 2018-05-13 DIAGNOSIS — G8929 Other chronic pain: Secondary | ICD-10-CM | POA: Diagnosis not present

## 2018-05-13 NOTE — Patient Instructions (Addendum)
Call your insurance and ask about coverage for Shingles vaccine. If covered in office we can start at next visit.   Vaccine Information Statement  Pneumococcal Polysaccharide Vaccine: What You Need to Know  Many Vaccine Information Statements are available in Spanish and other languages. See AbsolutelyGenuine.com.br. Hojas de informacin Sobre Vacunas estn disponibles en espaol y en muchos otros idiomas. Visite https://www.martin.org/.  1. Why get vaccinated?  Vaccination can protect older adults (and some children and younger adults) from pneumococcal disease.  Pneumococcal disease is caused by bacteria that can spread from person to person through close contact.  It can cause ear infections, and it can also lead to more serious infections of the: . Lungs (pneumonia), . Blood (bacteremia), and . Covering of the brain and spinal cord (meningitis). Meningitis can cause deafness and brain damage, and it can be fatal.    Anyone can get pneumococcal disease, but children under 60 years of age, people with certain medical conditions, adults over 26 years of age, and cigarette smokers are at the highest risk.  About 18,000 older adults die each year from pneumococcal disease in the [place].  Treatment of pneumococcal infections with penicillin and other drugs used to be more effective. But some strains of the disease have become resistant to these drugs. This makes prevention of the disease, through vaccination, even more important.  2. Pneumococcal polysaccharide vaccine (PPSV23)  Pneumococcal polysaccharide vaccine (PPSV23) protects against 23 types of pneumococcal bacteria. It will not prevent all pneumococcal disease.  PPSV23 is recommended for: . All adults 60 years of age and older, . Anyone 2 through 60 years of age with certain long-term health problems, . Anyone 2 through 60 years of age with a weakened immune system, . Adults 60 through 60 years of age who smoke cigarettes  or have asthma.   Most people need only one dose of PPSV.  A second dose is recommended for certain high-risk groups.  People 21 and older should get a dose even if they have gotten one or more doses of the vaccine before they turned 65.  Your healthcare provider can give you more information about these recommendations.  Most healthy adults develop protection within 2 to 3 weeks of getting the shot.   3. Some people should not get this vaccine  . Anyone who has had a life-threatening allergic reaction to PPSV should not get another dose.  . Anyone who has a severe allergy to any component of PPSV should not receive it. Tell your provider if you have any severe allergies.  . Anyone who is moderately or severely ill when the shot is scheduled may be asked to wait until they recover before getting the vaccine. Someone with a mild illness can usually be vaccinated.  . Children less than 60 years of age should not receive this vaccine.  . There is no evidence that PPSV is harmful to either a pregnant woman or to her fetus. However, as a precaution, women who need the vaccine should be vaccinated before becoming pregnant, if possible.  4. Risks of a vaccine reaction  With any medicine, including vaccines, there is a chance of side effects. These are usually mild and go away on their own, but serious reactions are also possible.   About half of people who get PPSV have mild side effects, such as redness or pain where the shot is given, which go away within about two days.  Less than 1 out of 100 people develop a fever,  muscle aches, or more severe local reactions.  Problems that could happen after any vaccine:  . People sometimes faint after a medical procedure, including vaccination. Sitting or lying down for about 15 minutes can help prevent fainting, and injuries caused by a fall. Tell your doctor if you feel dizzy, or have vision changes or ringing in the ears.  . Some people get severe  pain in the shoulder and have difficulty moving the arm where a shot was given. This happens very rarely.  . Any medication can cause a severe allergic reaction. Such reactions from a vaccine are very rare, estimated at about 1 in a million doses, and would happen within a few minutes to a few hours after the vaccination.   As with any medicine, there is a very remote chance of a vaccine causing a serious injury or death.  The safety of vaccines is always being monitored.  For more information, visit: http://www.aguilar.org/   5. What if there is a serious reaction?  What should I look for?  Look for anything that concerns you, such as signs of a severe allergic reaction, very high fever, or unusual behavior.  Signs of a severe allergic reaction can include hives, swelling of the face and throat, difficulty breathing, a fast heartbeat, dizziness, and weakness. These would usually start a few minutes to a few hours after the vaccination.  What should I do?  If you think it is a severe allergic reaction or other emergency that can't wait, call 9-1-1 or get to the nearest hospital. Otherwise, call your doctor.  Afterward, the reaction should be reported to the Vaccine Adverse Event Reporting System (VAERS). Your doctor might file this report, or you can do it yourself through the VAERS web site at www.vaers.SamedayNews.es, or by calling 223-656-1335.   VAERS does not give medical advice.  6. How can I learn more?  . Ask your doctor. He or she can give you the vaccine package insert or suggest other sources of information. . Call your local or state health department. Minette Brine the Centers for Disease Control and Prevention (CDC): - Call 513-531-6234 (1-800-CDC-INFO) or - Visit CDC's website at http://hunter.com/  [place]  03/05/2014  Department of Health and Geneticist, molecular for Disease Control and Prevention  Office Use Only

## 2018-05-13 NOTE — Progress Notes (Signed)
Michael Garcia is a 62 y.o. male is here for follow up.  History of Present Illness:   HPI:  Recent CPE. Labs to review together today. He is concerned about his oxygen level dropping. At last office visits with other providers. He states in the past it has been around 98%. He has history of smoking and just wanted to make sure he should not be concerned.   Very nervous about upcoming surgery. Preop evaluation tomorrow.   Reviewed notes with patient from Neurology, Urology, and Psych.  Results for orders placed or performed in visit on 05/05/18  CBC with Differential/Platelet  Result Value Ref Range   WBC 6.3 4.0 - 10.5 K/uL   RBC 4.17 (L) 4.22 - 5.81 Mil/uL   Hemoglobin 13.7 13.0 - 17.0 g/dL   HCT 39.2 39.0 - 52.0 %   MCV 94.0 78.0 - 100.0 fl   MCHC 35.0 30.0 - 36.0 g/dL   RDW 12.8 11.5 - 15.5 %   Platelets 210.0 150.0 - 400.0 K/uL   Neutrophils Relative % 71.0 43.0 - 77.0 %   Lymphocytes Relative 16.8 12.0 - 46.0 %   Monocytes Relative 7.7 3.0 - 12.0 %   Eosinophils Relative 3.8 0.0 - 5.0 %   Basophils Relative 0.7 0.0 - 3.0 %   Neutro Abs 4.5 1.4 - 7.7 K/uL   Lymphs Abs 1.1 0.7 - 4.0 K/uL   Monocytes Absolute 0.5 0.1 - 1.0 K/uL   Eosinophils Absolute 0.2 0.0 - 0.7 K/uL   Basophils Absolute 0.0 0.0 - 0.1 K/uL  Comprehensive metabolic panel  Result Value Ref Range   Sodium 140 135 - 145 mEq/L   Potassium 4.0 3.5 - 5.1 mEq/L   Chloride 103 96 - 112 mEq/L   CO2 30 19 - 32 mEq/L   Glucose, Bld 100 (H) 70 - 99 mg/dL   BUN 11 6 - 23 mg/dL   Creatinine, Ser 0.93 0.40 - 1.50 mg/dL   Total Bilirubin 0.5 0.2 - 1.2 mg/dL   Alkaline Phosphatase 54 39 - 117 U/L   AST 14 0 - 37 U/L   ALT 14 0 - 53 U/L   Total Protein 6.9 6.0 - 8.3 g/dL   Albumin 4.6 3.5 - 5.2 g/dL   Calcium 9.3 8.4 - 10.5 mg/dL   GFR 88.09 >60.00 mL/min  Lipid panel  Result Value Ref Range   Cholesterol 164 0 - 200 mg/dL   Triglycerides 88.0 0.0 - 149.0 mg/dL   HDL 55.40 >39.00 mg/dL   VLDL 17.6 0.0 -  40.0 mg/dL   LDL Cholesterol 91 0 - 99 mg/dL   Total CHOL/HDL Ratio 3    NonHDL 108.91   VITAMIN D 25 Hydroxy (Vit-D Deficiency, Fractures)  Result Value Ref Range   VITD 31.75 30.00 - 100.00 ng/mL  PSA  Result Value Ref Range   PSA 0.90 0.10 - 4.00 ng/mL  TSH  Result Value Ref Range   TSH 2.01 0.35 - 4.50 uIU/mL  T4, free  Result Value Ref Range   Free T4 0.83 0.60 - 1.60 ng/dL  Hepatitis C antibody  Result Value Ref Range   Hepatitis C Ab NON-REACTIVE NON-REACTI   SIGNAL TO CUT-OFF 0.04 <1.00  HIV antibody  Result Value Ref Range   HIV 1&2 Ab, 4th Generation NON-REACTIVE NON-REACTI   Health Maintenance Due  Topic Date Due  . COLONOSCOPY  05/01/2008   Depression screen [facility] 2/9 05/05/2018 11/21/2017 11/21/2017  Decreased Interest 1 0 0  Down, Depressed,  Hopeless _0 PHQ - 2 Score _1 Altered sleeping 0 3 -  Tired, decreased energy 1 0 -  Change in appetite 1 0 -  Feeling bad or failure about yourself  1 0 -  Trouble concentrating 1 1 -  Moving slowly or fidgety/restless 0 0 -  Suicidal thoughts 0 0 -  PHQ-9 Score 6 5 -   PMHx, SurgHx, SocialHx, FamHx, Medications, and Allergies were reviewed in the Visit Navigator and updated as appropriate.   Patient Active Problem List   Diagnosis Date Noted  . Intradural mass ([place]) 04/22/2018  . Anemia 11/21/2017  . Benign non-nodular prostatic hyperplasia with lower urinary tract symptoms 11/21/2017  . Erectile disorder, acquired, generalized, severe 11/21/2017  . Gastro-esophageal reflux disease with esophagitis 11/21/2017  . Lichen planus 98/92/1194  . Lipid disorder 11/21/2017  . Lumbar radicular pain 11/21/2017  . Migraine headache 11/21/2017  . Thyroid nodule 11/21/2017  . Glaucoma 11/21/2017  . Chronic myofascial pain 11/21/2017  . Peyronie disease 11/12/2016  . Incomplete right bundle branch block 10/18/2016  . GAD (generalized anxiety disorder) 03/29/2016  . Insomnia, persistent 03/29/2016  . MDD (major  depressive disorder), recurrent episode, moderate ([place]) 03/29/2016  . Nondependent alcohol abuse, in remission 03/29/2016  . Cervical spondylosis without myelopathy 04/29/2014  . Degenerative disc disease, cervical 04/29/2014  . Status post cervical spinal fusion 04/29/2014  . Cervical stenosis of spinal canal 11/12/2008   Social History   Tobacco Use  . Smoking status: Former Smoker    Types: Cigarettes    Last attempt to quit: 1994    Years since quitting: 25.5  . Smokeless tobacco: Never Used  Substance Use Topics  . Alcohol use: No    Frequency: Never  . Drug use: No   Current Medications and Allergies:   Current Outpatient Medications:  .  alfuzosin (UROXATRAL) 10 MG 24 hr tablet, TAKE 1 TABLET BY MOUTH DAILY, Disp: 30 tablet, Rfl: 2 .  ALPRAZolam (XANAX) 1 MG tablet, , Disp: , Rfl: 2 .  buPROPion (WELLBUTRIN XL) 300 MG 24 hr tablet, Take 1 tablet (300 mg total) by mouth daily., Disp: 90 tablet, Rfl: 1 .  cetirizine (ZYRTEC) 10 MG tablet, Take 1 tablet by mouth daily., Disp: , Rfl:  .  Cyanocobalamin (B-12 COMPLIANCE INJECTION) 1000 MCG/ML KIT, Inject 1,000 mcg as directed every 7 (seven) days. , Disp: , Rfl:  .  cyclobenzaprine (FLEXERIL) 10 MG tablet, Take 1 tablet by mouth as needed., Disp: , Rfl:  .  esomeprazole (NEXIUM) 40 MG capsule, Take 1 capsule (40 mg total) by mouth daily., Disp: 90 capsule, Rfl: 2 .  FIBER PO, Take by mouth., Disp: , Rfl:  .  finasteride (PROSCAR) 5 MG tablet, Take 1 tablet (5 mg total) by mouth daily., Disp: , Rfl:  .  fluticasone (FLONASE) 50 MCG/ACT nasal spray, , Disp: , Rfl:  .  Galcanezumab-gnlm (EMGALITY) 120 MG/ML SOAJ, Inject 120 mg into the skin as needed., Disp: 1 mL, Rfl: 3 .  lamoTRIgine (LAMICTAL) 200 MG tablet, TK 1 T PO D, Disp: 90 tablet, Rfl: 3 .  lidocaine (LIDODERM) 5 %, USE AS DIRECTED ONCE DAILY EXTERNALLY, Disp: 30 patch, Rfl: 2 .  Lidocaine 5 % CREA, , Disp: , Rfl:  .  Melatonin 3 MG TABS, , Disp: , Rfl:  .  meloxicam  (MOBIC) 15 MG tablet, Take 1 tablet (15 mg total) by mouth daily., Disp: 30 tablet, Rfl: 0 .  MULTIPLE VITAMIN  PO, Take by mouth., Disp: , Rfl:  .  rizatriptan (MAXALT) 10 MG tablet, Take 1 tablet (10 mg total) by mouth as needed for migraine., Disp: 10 tablet, Rfl: 1 .  sildenafil (VIAGRA) 100 MG tablet, , Disp: , Rfl:  .  tacrolimus (PROTOPIC) 0.1 % ointment, APPLY TO AFFECTED AREAS UTD, Disp: , Rfl:  .  traZODone (DESYREL) 100 MG tablet, Take 1 tablet (100 mg total) by mouth at bedtime as needed for sleep., Disp: 30 tablet, Rfl: 2   Allergies  Allergen Reactions  . Bacitracin Itching and Rash  . Iodine Itching and Rash    IV Iodine   . Sulfamethoxazole-Trimethoprim Rash   Review of Systems   Pertinent items are noted in the HPI. Otherwise, ROS is negative.  Vitals:   Vitals:   05/13/18 0710  BP: 120/78  Pulse: 80  Temp: 98.6 F (37 C)  TempSrc: Oral  SpO2: 96%  Weight: 227 lb 12.8 oz (103.3 kg)  Height: _0  (1.93 m)     Body mass index is 27.73 kg/m.  Physical Exam:   Physical Exam  Constitutional: He is oriented to person, place, and time. He appears well-developed and well-nourished. No distress.  HENT:  Head: Normocephalic and atraumatic.  Right Ear: External ear normal.  Left Ear: External ear normal.  Nose: Nose normal.  Mouth/Throat: Oropharynx is clear and moist.  Eyes: Pupils are equal, round, and reactive to light. Conjunctivae and EOM are normal.  Neck: Normal range of motion. Neck supple.  Cardiovascular: Normal rate, regular rhythm, normal heart sounds and intact distal pulses.  Pulmonary/Chest: Effort normal and breath sounds normal.  Abdominal: Soft. Bowel sounds are normal.  Musculoskeletal: Normal range of motion.  Neurological: He is alert and oriented to person, place, and time.  Skin: Skin is warm and dry.  Psychiatric: He has a normal mood and affect. His behavior is normal. Judgment and thought content normal.  Nursing note and vitals  reviewed.  Results for orders placed or performed in visit on 05/05/18  CBC with Differential/Platelet  Result Value Ref Range   WBC 6.3 4.0 - 10.5 K/uL   RBC 4.17 (L) 4.22 - 5.81 Mil/uL   Hemoglobin 13.7 13.0 - 17.0 g/dL   HCT 39.2 39.0 - 52.0 %   MCV 94.0 78.0 - 100.0 fl   MCHC 35.0 30.0 - 36.0 g/dL   RDW 12.8 11.5 - 15.5 %   Platelets 210.0 150.0 - 400.0 K/uL   Neutrophils Relative % 71.0 43.0 - 77.0 %   Lymphocytes Relative 16.8 12.0 - 46.0 %   Monocytes Relative 7.7 3.0 - 12.0 %   Eosinophils Relative 3.8 0.0 - 5.0 %   Basophils Relative 0.7 0.0 - 3.0 %   Neutro Abs 4.5 1.4 - 7.7 K/uL   Lymphs Abs 1.1 0.7 - 4.0 K/uL   Monocytes Absolute 0.5 0.1 - 1.0 K/uL   Eosinophils Absolute 0.2 0.0 - 0.7 K/uL   Basophils Absolute 0.0 0.0 - 0.1 K/uL  Comprehensive metabolic panel  Result Value Ref Range   Sodium 140 135 - 145 mEq/L   Potassium 4.0 3.5 - 5.1 mEq/L   Chloride 103 96 - 112 mEq/L   CO2 30 19 - 32 mEq/L   Glucose, Bld 100 (H) 70 - 99 mg/dL   BUN 11 6 - 23 mg/dL   Creatinine, Ser 0.93 0.40 - 1.50 mg/dL   Total Bilirubin 0.5 0.2 - 1.2 mg/dL   Alkaline Phosphatase 54 39 - 117 U/L  AST 14 0 - 37 U/L   ALT 14 0 - 53 U/L   Total Protein 6.9 6.0 - 8.3 g/dL   Albumin 4.6 3.5 - 5.2 g/dL   Calcium 9.3 8.4 - 10.5 mg/dL   GFR 88.09 >60.00 mL/min  Lipid panel  Result Value Ref Range   Cholesterol 164 0 - 200 mg/dL   Triglycerides 88.0 0.0 - 149.0 mg/dL   HDL 55.40 >39.00 mg/dL   VLDL 17.6 0.0 - 40.0 mg/dL   LDL Cholesterol 91 0 - 99 mg/dL   Total CHOL/HDL Ratio 3    NonHDL 108.91   VITAMIN D 25 Hydroxy (Vit-D Deficiency, Fractures)  Result Value Ref Range   VITD 31.75 30.00 - 100.00 ng/mL  PSA  Result Value Ref Range   PSA 0.90 0.10 - 4.00 ng/mL  TSH  Result Value Ref Range   TSH 2.01 0.35 - 4.50 uIU/mL  T4, free  Result Value Ref Range   Free T4 0.83 0.60 - 1.60 ng/dL  Hepatitis C antibody  Result Value Ref Range   Hepatitis C Ab NON-REACTIVE NON-REACTI    SIGNAL TO CUT-OFF 0.04 <1.00  HIV antibody  Result Value Ref Range   HIV 1&2 Ab, 4th Generation NON-REACTIVE NON-REACTI    Assessment and Plan:   Michael Garcia was seen today for follow-up.  Diagnoses and all orders for this visit:  Chronic myofascial pain  Intradural mass ([place]) Comments: Upcoming surgery.   Intractable periodic headache syndrome Comments: Followed by Neurology.   MDD (major depressive disorder), recurrent episode, moderate ([facility]) Comments: Followed by Psych.   GAD (generalized anxiety disorder) Comments: Followed by Psych.  Need for pneumococcal vaccination -     Pneumococcal polysaccharide vaccine 23-valent greater than or equal to 2yo subcutaneous/IM  Former smoker Comments: Okay for CXR after surgery if needed.     . Reviewed expectations re: course of current medical issues. . Discussed self-management of symptoms. . Outlined signs and symptoms indicating need for more acute intervention. . Patient verbalized understanding and all questions were answered. Marland Kitchen Health Maintenance issues including appropriate healthy diet, exercise, and smoking avoidance were discussed with patient. . See orders for this visit as documented in the electronic medical record. . Patient received an After Visit Summary.  Briscoe Deutscher, DO [place], [place] 05/16/2018  Future Appointments  Date Time Provider [place]  05/29/2018  9:00 AM Magnus Sinning, MD [facility] None  06/30/2018  7:00 AM Briscoe Deutscher, DO [facility]  08/19/2018  8:30 AM Kathrynn Ducking, MD [facility] None   CMA served as scribe during this visit. History, Physical, and Plan performed by medical provider. The above documentation has been reviewed and is accurate and complete. Briscoe Deutscher, D.O.  Records requested if needed. Time spent with the patient: 25 minutes, of which >50% was spent in obtaining information about his symptoms, reviewing his previous labs, evaluations, and treatments,  counseling him about his condition (please see the discussed topics above), and developing a plan to further investigate it; he had a number of questions which I addressed.

## 2018-05-14 DIAGNOSIS — G959 Disease of spinal cord, unspecified: Secondary | ICD-10-CM | POA: Diagnosis not present

## 2018-05-14 DIAGNOSIS — I4510 Unspecified right bundle-branch block: Secondary | ICD-10-CM | POA: Diagnosis not present

## 2018-05-14 DIAGNOSIS — G43001 Migraine without aura, not intractable, with status migrainosus: Secondary | ICD-10-CM | POA: Diagnosis not present

## 2018-05-14 DIAGNOSIS — D500 Iron deficiency anemia secondary to blood loss (chronic): Secondary | ICD-10-CM | POA: Diagnosis not present

## 2018-05-14 DIAGNOSIS — E041 Nontoxic single thyroid nodule: Secondary | ICD-10-CM | POA: Diagnosis not present

## 2018-05-14 DIAGNOSIS — N486 Induration penis plastica: Secondary | ICD-10-CM | POA: Diagnosis not present

## 2018-05-14 DIAGNOSIS — K210 Gastro-esophageal reflux disease with esophagitis: Secondary | ICD-10-CM | POA: Diagnosis not present

## 2018-05-14 DIAGNOSIS — M5030 Other cervical disc degeneration, unspecified cervical region: Secondary | ICD-10-CM | POA: Diagnosis not present

## 2018-05-14 DIAGNOSIS — N401 Enlarged prostate with lower urinary tract symptoms: Secondary | ICD-10-CM | POA: Diagnosis not present

## 2018-05-16 ENCOUNTER — Encounter: Payer: Self-pay | Admitting: Family Medicine

## 2018-05-16 ENCOUNTER — Ambulatory Visit: Payer: 59 | Admitting: Neurology

## 2018-05-16 DIAGNOSIS — G959 Disease of spinal cord, unspecified: Secondary | ICD-10-CM | POA: Diagnosis not present

## 2018-05-16 DIAGNOSIS — F331 Major depressive disorder, recurrent, moderate: Secondary | ICD-10-CM | POA: Diagnosis not present

## 2018-05-16 DIAGNOSIS — G43C1 Periodic headache syndromes in child or adult, intractable: Secondary | ICD-10-CM | POA: Diagnosis not present

## 2018-05-16 DIAGNOSIS — M7918 Myalgia, other site: Secondary | ICD-10-CM | POA: Diagnosis not present

## 2018-05-16 DIAGNOSIS — Z87891 Personal history of nicotine dependence: Secondary | ICD-10-CM | POA: Diagnosis not present

## 2018-05-16 DIAGNOSIS — Z23 Encounter for immunization: Secondary | ICD-10-CM | POA: Diagnosis not present

## 2018-05-16 DIAGNOSIS — F411 Generalized anxiety disorder: Secondary | ICD-10-CM | POA: Diagnosis not present

## 2018-05-16 DIAGNOSIS — G8929 Other chronic pain: Secondary | ICD-10-CM | POA: Diagnosis not present

## 2018-05-19 ENCOUNTER — Ambulatory Visit: Payer: 59 | Admitting: Family Medicine

## 2018-05-23 DIAGNOSIS — D3610 Benign neoplasm of peripheral nerves and autonomic nervous system, unspecified: Secondary | ICD-10-CM | POA: Diagnosis not present

## 2018-05-23 DIAGNOSIS — K5900 Constipation, unspecified: Secondary | ICD-10-CM | POA: Diagnosis not present

## 2018-05-23 DIAGNOSIS — K219 Gastro-esophageal reflux disease without esophagitis: Secondary | ICD-10-CM | POA: Diagnosis not present

## 2018-05-23 DIAGNOSIS — Z981 Arthrodesis status: Secondary | ICD-10-CM | POA: Diagnosis not present

## 2018-05-23 DIAGNOSIS — D649 Anemia, unspecified: Secondary | ICD-10-CM | POA: Diagnosis not present

## 2018-05-23 DIAGNOSIS — D497 Neoplasm of unspecified behavior of endocrine glands and other parts of nervous system: Secondary | ICD-10-CM | POA: Diagnosis not present

## 2018-05-23 DIAGNOSIS — G9519 Other vascular myelopathies: Secondary | ICD-10-CM | POA: Diagnosis not present

## 2018-05-23 DIAGNOSIS — D72829 Elevated white blood cell count, unspecified: Secondary | ICD-10-CM | POA: Diagnosis not present

## 2018-05-23 DIAGNOSIS — I10 Essential (primary) hypertension: Secondary | ICD-10-CM | POA: Diagnosis not present

## 2018-05-23 DIAGNOSIS — G959 Disease of spinal cord, unspecified: Secondary | ICD-10-CM | POA: Diagnosis not present

## 2018-05-23 DIAGNOSIS — R319 Hematuria, unspecified: Secondary | ICD-10-CM | POA: Diagnosis not present

## 2018-05-23 DIAGNOSIS — D334 Benign neoplasm of spinal cord: Secondary | ICD-10-CM | POA: Diagnosis not present

## 2018-05-23 DIAGNOSIS — Z87891 Personal history of nicotine dependence: Secondary | ICD-10-CM | POA: Diagnosis not present

## 2018-05-23 MED ORDER — LACTATED RINGERS IV SOLN
INTRAVENOUS | Status: DC
Start: ? — End: 2018-05-23

## 2018-05-23 MED ORDER — GENERIC EXTERNAL MEDICATION
Status: DC
Start: ? — End: 2018-05-23

## 2018-05-23 MED ORDER — GELATIN ABSORBABLE 100 EX MISC
CUTANEOUS | Status: DC
Start: ? — End: 2018-05-23

## 2018-05-23 MED ORDER — BUPIVACAINE-EPINEPHRINE (PF) 0.25% -1:200000 IJ SOLN
INTRAMUSCULAR | Status: DC
Start: ? — End: 2018-05-23

## 2018-05-23 MED ORDER — CEFAZOLIN SODIUM 1 G IJ SOLR
INTRAMUSCULAR | Status: DC
Start: ? — End: 2018-05-23

## 2018-05-23 MED ORDER — LIDOCAINE HCL 1 % IJ SOLN
0.50 | INTRAMUSCULAR | Status: DC
Start: ? — End: 2018-05-23

## 2018-05-26 MED ORDER — HEPARIN SODIUM (PORCINE) 5000 UNIT/ML IJ SOLN
5000.00 | INTRAMUSCULAR | Status: DC
Start: 2018-05-26 — End: 2018-05-26

## 2018-05-26 MED ORDER — GENERIC EXTERNAL MEDICATION
1.00 | Status: DC
Start: ? — End: 2018-05-26

## 2018-05-26 MED ORDER — METHOCARBAMOL 500 MG PO TABS
750.00 | ORAL_TABLET | ORAL | Status: DC
Start: 2018-05-26 — End: 2018-05-26

## 2018-05-26 MED ORDER — TAMSULOSIN HCL 0.4 MG PO CAPS
0.40 | ORAL_CAPSULE | ORAL | Status: DC
Start: 2018-05-27 — End: 2018-05-26

## 2018-05-26 MED ORDER — BISACODYL 10 MG RE SUPP
10.00 | RECTAL | Status: DC
Start: ? — End: 2018-05-26

## 2018-05-26 MED ORDER — RANITIDINE HCL 150 MG PO TABS
150.00 | ORAL_TABLET | ORAL | Status: DC
Start: ? — End: 2018-05-26

## 2018-05-26 MED ORDER — DEXAMETHASONE SODIUM PHOSPHATE 4 MG/ML IJ SOLN
2.00 | INTRAMUSCULAR | Status: DC
Start: ? — End: 2018-05-26

## 2018-05-26 MED ORDER — TRAZODONE HCL 100 MG PO TABS
100.00 | ORAL_TABLET | ORAL | Status: DC
Start: 2018-05-26 — End: 2018-05-26

## 2018-05-26 MED ORDER — ALUM & MAG HYDROXIDE-SIMETH 400-400-40 MG/5ML PO SUSP
30.00 | ORAL | Status: DC
Start: ? — End: 2018-05-26

## 2018-05-26 MED ORDER — POLYVINYL ALCOHOL 1.4 % OP SOLN
1.00 | OPHTHALMIC | Status: DC
Start: ? — End: 2018-05-26

## 2018-05-26 MED ORDER — PANTOPRAZOLE SODIUM 40 MG PO TBEC
40.00 | DELAYED_RELEASE_TABLET | ORAL | Status: DC
Start: 2018-05-27 — End: 2018-05-26

## 2018-05-26 MED ORDER — DEXTROSE 50 % IV SOLN
12.50 | INTRAVENOUS | Status: DC
Start: ? — End: 2018-05-26

## 2018-05-26 MED ORDER — SENNOSIDES-DOCUSATE SODIUM 8.6-50 MG PO TABS
1.00 | ORAL_TABLET | ORAL | Status: DC
Start: 2018-05-26 — End: 2018-05-26

## 2018-05-26 MED ORDER — FINASTERIDE 5 MG PO TABS
5.00 | ORAL_TABLET | ORAL | Status: DC
Start: 2018-05-27 — End: 2018-05-26

## 2018-05-26 MED ORDER — POLYETHYLENE GLYCOL 3350 17 G PO PACK
17.00 | PACK | ORAL | Status: DC
Start: 2018-05-27 — End: 2018-05-26

## 2018-05-26 MED ORDER — INSULIN LISPRO 100 UNIT/ML ~~LOC~~ SOLN
0.00 | SUBCUTANEOUS | Status: DC
Start: 2018-05-26 — End: 2018-05-26

## 2018-05-26 MED ORDER — SUPER THERA VITE M PO TABS
1.00 | ORAL_TABLET | ORAL | Status: DC
Start: 2018-05-27 — End: 2018-05-26

## 2018-05-26 MED ORDER — SIMETHICONE 80 MG PO CHEW
80.00 | CHEWABLE_TABLET | ORAL | Status: DC
Start: ? — End: 2018-05-26

## 2018-05-26 MED ORDER — GENERIC EXTERNAL MEDICATION
1000.00 | Status: DC
Start: 2018-05-27 — End: 2018-05-26

## 2018-05-26 MED ORDER — BUPROPION HCL ER (SR) 150 MG PO TB12
150.00 | ORAL_TABLET | ORAL | Status: DC
Start: 2018-05-26 — End: 2018-05-26

## 2018-05-26 MED ORDER — LAMOTRIGINE 200 MG PO TABS
200.00 | ORAL_TABLET | ORAL | Status: DC
Start: 2018-05-27 — End: 2018-05-26

## 2018-05-26 MED ORDER — OXYCODONE HCL 5 MG PO TABS
5.00 | ORAL_TABLET | ORAL | Status: DC
Start: ? — End: 2018-05-26

## 2018-05-26 MED ORDER — MELATONIN 3 MG PO TABS
6.00 | ORAL_TABLET | ORAL | Status: DC
Start: 2018-05-26 — End: 2018-05-26

## 2018-05-26 MED ORDER — GENERIC EXTERNAL MEDICATION
Status: DC
Start: ? — End: 2018-05-26

## 2018-05-27 DIAGNOSIS — G959 Disease of spinal cord, unspecified: Secondary | ICD-10-CM | POA: Diagnosis not present

## 2018-05-27 MED ORDER — GENERIC EXTERNAL MEDICATION
2.00 | Status: DC
Start: 2018-05-26 — End: 2018-05-27

## 2018-05-27 MED ORDER — GENERIC EXTERNAL MEDICATION
Status: DC
Start: ? — End: 2018-05-27

## 2018-05-28 ENCOUNTER — Other Ambulatory Visit: Payer: Self-pay | Admitting: *Deleted

## 2018-05-28 NOTE — Patient Outreach (Signed)
[facility]) Care Management  05/28/2018  Kesler Wickham 12-30-1955 [id number]   Subjective: Telephone call to patient's home / mobile number, spoke with patient, and HIPAA verified.  Discussed [facility] Care Management UMR Transition of care follow up, patient voiced understanding, and is in agreement to follow up.  Spoke with patient, states he is doing good, pretty sore, medication managing the pain appropriately, and has a follow up appointment with surgeon on 06/04/18.  Patient states he is able to manage self care and has assistance as needed with activities of daily living / home management.  Patient voices understanding of medical diagnosis, surgery, and treatment plan. States he is accessing the following [facility] benefits: outpatient pharmacy, hospital indemnity (not chosen), and has  family medical leave act [employer]) in place.  Patient states he does not have any education material, transition of care, care coordination, disease management, disease monitoring, transportation, community resource, or pharmacy needs at this time. States he is very appreciative of the follow up and is in agreement to receive [facility] information.      Objective: Per KPN (Knowledge Performance Now, point of care tool) and chart review, patient hospitalized 05/23/18 -05/26/18 for Intradural tumor, and status post T11-L1 laminoplasty with resection of lesion.    Patient also has a history of arthritis and Thyroid nodules.       Assessment:  Received UMR Transition of care referral on 05/23/18.   Transition of care follow up completed, no care management needs, and will proceed with case closure.       Plan: RNCM will send patient successful outreach letter, [facility] pamphlet, and magnet. RNCM will complete case closure due to follow up completed / no care management needs.      Radha Coggins H. Annia Friendly, BSN, [place] Management [facility] Telephonic CM Phone: [phone] Fax:  [phone]

## 2018-05-29 ENCOUNTER — Ambulatory Visit (INDEPENDENT_AMBULATORY_CARE_PROVIDER_SITE_OTHER): Payer: Self-pay | Admitting: Physical Medicine and Rehabilitation

## 2018-06-04 DIAGNOSIS — G959 Disease of spinal cord, unspecified: Secondary | ICD-10-CM | POA: Diagnosis not present

## 2018-06-05 NOTE — Progress Notes (Signed)
Michael Garcia is a 62 y.o. male is here for follow up.  History of Present Illness:   HPI:  Pt here for follow up post back surgery 7/12,  wants to discuss having trouble with constipation and hemorrhoids. Also discuss Robaxin.  Constipation: Started last week. Feels the urge to go, but never completes. Using fiber, minimal Miralax, and Fleet's enema to have BM. No abdominal pain. No issues with urination, other than some resolving hematuria after surgical procedure. Minimal paresthesias and lower extremity pain. No new trauma. No N/V. No fever. BMs are soft. Mild hemorrhoids. No incontinence.   Health Maintenance Due  Topic Date Due  . COLONOSCOPY  05/01/2008   Depression screen [facility] 2/9 05/05/2018 11/21/2017 11/21/2017  Decreased Interest 1 0 0  Down, Depressed, Hopeless '1 1 1  ' PHQ - 2 Score '2 1 1  ' Altered sleeping 0 3 -  Tired, decreased energy 1 0 -  Change in appetite 1 0 -  Feeling bad or failure about yourself  1 0 -  Trouble concentrating 1 1 -  Moving slowly or fidgety/restless 0 0 -  Suicidal thoughts 0 0 -  PHQ-9 Score 6 5 -   PMHx, SurgHx, SocialHx, FamHx, Medications, and Allergies were reviewed in the Visit Navigator and updated as appropriate.   Patient Active Problem List   Diagnosis Date Noted  . Intradural mass ([place]) 04/22/2018  . Anemia 11/21/2017  . Benign non-nodular prostatic hyperplasia with lower urinary tract symptoms 11/21/2017  . Erectile disorder, acquired, generalized, severe 11/21/2017  . Gastro-esophageal reflux disease with esophagitis 11/21/2017  . Lichen planus 74/25/9563  . Lipid disorder 11/21/2017  . Lumbar radicular pain 11/21/2017  . Migraine headache 11/21/2017  . Thyroid nodule 11/21/2017  . Glaucoma 11/21/2017  . Chronic myofascial pain 11/21/2017  . Peyronie disease 11/12/2016  . Incomplete right bundle branch block 10/18/2016  . GAD (generalized anxiety disorder) 03/29/2016  . Insomnia, persistent 03/29/2016  . MDD (major  depressive disorder), recurrent episode, moderate ([place]) 03/29/2016  . Nondependent alcohol abuse, in remission 03/29/2016  . Cervical spondylosis without myelopathy 04/29/2014  . Degenerative disc disease, cervical 04/29/2014  . Status post cervical spinal fusion 04/29/2014  . Cervical stenosis of spinal canal 11/12/2008   Social History   Tobacco Use  . Smoking status: Former Smoker    Types: Cigarettes    Last attempt to quit: 1994    Years since quitting: 25.5  . Smokeless tobacco: Never Used  Substance Use Topics  . Alcohol use: No    Frequency: Never  . Drug use: No   Current Medications and Allergies:   Current Outpatient Medications:  .  alfuzosin (UROXATRAL) 10 MG 24 hr tablet, TAKE 1 TABLET BY MOUTH DAILY, Disp: 30 tablet, Rfl: 2 .  ALPRAZolam (XANAX) 1 MG tablet, , Disp: , Rfl: 2 .  buPROPion (WELLBUTRIN SR) 150 MG 12 hr tablet, Take 150 mg by mouth 2 (two) times daily. , Disp: , Rfl: 0 .  cetirizine (ZYRTEC) 10 MG tablet, Take 1 tablet by mouth daily., Disp: , Rfl:  .  Cyanocobalamin (B-12 COMPLIANCE INJECTION) 1000 MCG/ML KIT, Inject 1,000 mcg as directed every 7 (seven) days. , Disp: , Rfl:  .  esomeprazole (NEXIUM) 40 MG capsule, Take 1 capsule (40 mg total) by mouth daily., Disp: 90 capsule, Rfl: 2 .  FIBER PO, Take by mouth., Disp: , Rfl:  .  finasteride (PROSCAR) 5 MG tablet, Take 1 tablet (5 mg total) by mouth daily., Disp: , Rfl:  .  fluticasone (FLONASE) 50 MCG/ACT nasal spray, , Disp: , Rfl:  .  Galcanezumab-gnlm (EMGALITY) 120 MG/ML SOAJ, Inject 120 mg into the skin as needed., Disp: 1 mL, Rfl: 3 .  lamoTRIgine (LAMICTAL) 200 MG tablet, TK 1 T PO D, Disp: 90 tablet, Rfl: 3 .  lidocaine (LIDODERM) 5 %, USE AS DIRECTED ONCE DAILY EXTERNALLY, Disp: 30 patch, Rfl: 2 .  Lidocaine 5 % CREA, , Disp: , Rfl:  .  Melatonin 3 MG TABS, , Disp: , Rfl:  .  methocarbamol (ROBAXIN) 750 MG tablet, Take 750 mg by mouth 3 (three) times daily., Disp: , Rfl:  .  MULTIPLE  VITAMIN PO, Take by mouth., Disp: , Rfl:  .  ondansetron (ZOFRAN-ODT) 4 MG disintegrating tablet, , Disp: , Rfl: 0 .  oxyCODONE (ROXICODONE) 5 MG immediate release tablet, , Disp: , Rfl:  .  rizatriptan (MAXALT) 10 MG tablet, Take 1 tablet (10 mg total) by mouth as needed for migraine., Disp: 10 tablet, Rfl: 1 .  sildenafil (VIAGRA) 100 MG tablet, , Disp: , Rfl:  .  tacrolimus (PROTOPIC) 0.1 % ointment, APPLY TO AFFECTED AREAS UTD, Disp: , Rfl:  .  traZODone (DESYREL) 100 MG tablet, Take 1 tablet (100 mg total) by mouth at bedtime as needed for sleep., Disp: 30 tablet, Rfl: 2 .  cyclobenzaprine (FLEXERIL) 10 MG tablet, Take 1 tablet by mouth as needed., Disp: , Rfl:  .  meloxicam (MOBIC) 15 MG tablet, Take 1 tablet (15 mg total) by mouth daily. (Patient not taking: Reported on 06/06/2018), Disp: 30 tablet, Rfl: 0   Allergies  Allergen Reactions  . Bacitracin Itching and Rash  . Iodine Itching and Rash    IV Iodine   . Sulfamethoxazole-Trimethoprim Rash   Review of Systems   Pertinent items are noted in the HPI. Otherwise, ROS is negative.  Vitals:   Vitals:   06/06/18 1519  BP: 118/74  Pulse: 81  Temp: 98.5 F (36.9 C)  TempSrc: Oral  SpO2: 98%  Weight: 216 lb (98 kg)  Height: '6\' 4"'  (1.93 m)     Body mass index is 26.29 kg/m.  Physical Exam:   Physical Exam  Constitutional: He is oriented to person, place, and time. He appears well-developed and well-nourished. No distress.  HENT:  Head: Normocephalic and atraumatic.  Right Ear: External ear normal.  Left Ear: External ear normal.  Nose: Nose normal.  Mouth/Throat: Oropharynx is clear and moist.  Eyes: Pupils are equal, round, and reactive to light. Conjunctivae and EOM are normal.  Neck: Normal range of motion. Neck supple.  Cardiovascular: Normal rate, regular rhythm and intact distal pulses.  Pulmonary/Chest: Effort normal and breath sounds normal.  Abdominal: Soft. Bowel sounds are normal.  Genitourinary:  Rectal exam shows internal hemorrhoid. Rectal exam shows no external hemorrhoid, no fissure, no mass, no tenderness and anal tone normal.  Neurological: He is alert and oriented to person, place, and time.  Skin: Skin is warm and dry.  Psychiatric: He has a normal mood and affect. His behavior is normal. Judgment and thought content normal.  Nursing note and vitals reviewed.  Assessment and Plan:   Seven was seen today for follow-up.  Diagnoses and all orders for this visit:  Constipation, after Neurosurgery Comments: No red flags. Normal rectal tone. Handout with instructions for opiod induced constipation. Sample of [place] with precautions.   Other orders -     methocarbamol (ROBAXIN) 750 MG tablet; Take 1 tablet (750 mg total) by mouth 3 (  three) times daily.    . Reviewed expectations re: course of current medical issues. . Discussed self-management of symptoms. . Outlined signs and symptoms indicating need for more acute intervention. . Patient verbalized understanding and all questions were answered. Marland Kitchen Health Maintenance issues including appropriate healthy diet, exercise, and smoking avoidance were discussed with patient. . See orders for this visit as documented in the electronic medical record. . Patient received an After Visit Summary.  Briscoe Deutscher, DO [place], [facility] [place] 06/07/2018  Future Appointments  Date Time Provider [place]  06/17/2018  9:30 AM Magnus Sinning, MD [facility] None  08/19/2018  8:30 AM Kathrynn Ducking, MD [facility] None   CMA served as scribe during this visit. History, Physical, and Plan performed by medical provider. The above documentation has been reviewed and is accurate and complete. Briscoe Deutscher, D.O.

## 2018-06-06 ENCOUNTER — Ambulatory Visit: Payer: 59 | Admitting: Family Medicine

## 2018-06-06 ENCOUNTER — Encounter: Payer: Self-pay | Admitting: Family Medicine

## 2018-06-06 VITALS — BP 118/74 | HR 81 | Temp 98.5°F | Ht 76.0 in | Wt 216.0 lb

## 2018-06-06 DIAGNOSIS — K5909 Other constipation: Secondary | ICD-10-CM

## 2018-06-06 MED ORDER — METHOCARBAMOL 750 MG PO TABS: 750.0000 mg | ORAL_TABLET | Freq: Three times a day (TID) | ORAL | 0 refills | Status: DC

## 2018-06-09 ENCOUNTER — Encounter: Payer: Self-pay | Admitting: Family Medicine

## 2018-06-17 ENCOUNTER — Ambulatory Visit (INDEPENDENT_AMBULATORY_CARE_PROVIDER_SITE_OTHER): Payer: 59 | Admitting: Physical Medicine and Rehabilitation

## 2018-06-17 DIAGNOSIS — G894 Chronic pain syndrome: Secondary | ICD-10-CM

## 2018-06-17 DIAGNOSIS — M5116 Intervertebral disc disorders with radiculopathy, lumbar region: Secondary | ICD-10-CM

## 2018-06-17 DIAGNOSIS — M5416 Radiculopathy, lumbar region: Secondary | ICD-10-CM | POA: Diagnosis not present

## 2018-06-17 DIAGNOSIS — M961 Postlaminectomy syndrome, not elsewhere classified: Secondary | ICD-10-CM | POA: Diagnosis not present

## 2018-06-17 DIAGNOSIS — R202 Paresthesia of skin: Secondary | ICD-10-CM | POA: Diagnosis not present

## 2018-06-18 ENCOUNTER — Encounter (INDEPENDENT_AMBULATORY_CARE_PROVIDER_SITE_OTHER): Payer: Self-pay | Admitting: Physical Medicine and Rehabilitation

## 2018-06-18 MED ORDER — OXYCODONE HCL 5 MG PO TABS: 5.0000 mg | ORAL_TABLET | Freq: Four times a day (QID) | ORAL | 0 refills | Status: DC | PRN

## 2018-06-18 MED ORDER — METHOCARBAMOL 750 MG PO TABS: 750.0000 mg | ORAL_TABLET | Freq: Three times a day (TID) | ORAL | 0 refills | Status: DC

## 2018-06-18 MED ORDER — GABAPENTIN 300 MG PO CAPS: 300.0000 mg | ORAL_CAPSULE | Freq: Every day | ORAL | 3 refills | Status: DC

## 2018-06-18 NOTE — Progress Notes (Signed)
Michael Garcia - 62 y.o. male MRN [id number]  Date of birth: [birth year]  Office Visit Note: Visit Date: 06/17/2018 PCP: Briscoe Deutscher, DO Referred by: Briscoe Deutscher, DO  Subjective: Chief Complaint  Patient presents with  . Lower Back - Pain, Follow-up  . Left Leg - Pain  . Left Foot - Pain, Numbness   HPI: Mr. Michael Garcia is a 62 year old gentleman that I originally saw a few months ago for ongoing history of back pain which was referring into the left groin and ultimately MRI revealed lower thoracic upper lumbar spinal cord dural mass.  This was thought to be an ependymoma but now he has had resection which was performed on 06/04/2018 at [facility].  Those notes can be reviewed in epic.  This turned out to be a schwannoma which is probably a better prognosis as these are usually benign.  The surgery went well but was somewhat longer than expected as the nerve roots were intertwined with the schwannoma.  Post surgery the patient has had increased symptoms of neuropathic pain in the left foot in particular with a burning sensation which she did not have prior to the surgery.  He still has some level of back pain in general.  He is really only about 3 weeks from the spine surgery.  He was initially given 100 tablets of 5 mg of oxycodone.  He reports today that he is down to 1 tablet.  He has had follow-up with his primary care physician Dr. Juleen China.  He has ongoing issues of chronic headache and migraine.  Most recently he has had increased spasms of the low back and legs.  He reports it is very hard for him to straighten out his hamstrings as he starts to get spasming in both hamstrings may be more right than left.  He does get some numbness in the toes as well.  He was given methocarbamol and that has helped the spasms.  MRI from prior to the surgery did show disc herniation at L2-3 more left-sided but he was not really having much in the way of radicular complaints down the leg at that  point.  He has some level of osteoarthritis as well particularly at L4-5.  He has not noted any focal weakness or foot drop prior to surgery or post surgery.  Surgery was performed with electrodiagnostic monitoring.  Patient is scheduled for physical therapy 6 weeks post surgery if this is not begun yet.  Patient is asking about strategies from a standpoint of his pain and spasms in his low back.  He continues to follow with Dr. Jannifer Franklin at [facility] neurology for his headaches.   Review of Systems  Constitutional: Negative for chills, fever, malaise/fatigue and weight loss.  HENT: Negative for hearing loss and sinus pain.   Eyes: Negative for blurred vision, double vision and photophobia.  Respiratory: Negative for cough and shortness of breath.   Cardiovascular: Negative for chest pain, palpitations and leg swelling.  Gastrointestinal: Negative for abdominal pain, nausea and vomiting.  Genitourinary: Negative for flank pain.  Musculoskeletal: Positive for back pain. Negative for myalgias.  Skin: Negative for itching and rash.  Neurological: Positive for tingling, sensory change and headaches. Negative for tremors, focal weakness and weakness.  Endo/Heme/Allergies: Negative.   Psychiatric/Behavioral: Negative for depression.  All other systems reviewed and are negative.  Otherwise per HPI.  Assessment & Plan: Visit Diagnoses:  1. Lumbar radicular pain   2. Post laminectomy syndrome   3. Chronic pain syndrome  4. Radiculopathy due to lumbar intervertebral disc disorder   5. Paresthesia of skin     Plan: Findings:  Radicular type neuropathic pain particularly on the left lower leg more of an S1 distribution and this is mainly status post resection of schwannoma performed at [facility] on 06/04/2018.  Pain is somewhat controlled with oxycodone and Robaxin/methocarbamol.  We spoke at length about spasming and some treatments to do other than medication but did tell him that Robaxin is a  decent medication if it is helping.  He reports that he does not have any of the Robaxin left he does as a few tablets.  We talked about gentle stretching the use of ice.  We also talked about the fact that he is only 3 to 4 weeks status post a significant spine surgery and that this point he really needs to keep follow-up with a spine surgeon in terms of pain control.  This is a little bit out of my realm of practice to do postop pain control.  He essentially has been taking 3 of the oxycodone daily as he is about out of his medication and he was given 100 tablets.  I did review the controlled substance database and this is accurate.  I did tell him I would refill the Robaxin and the oxycodone temporarily and he needs to have follow-up with his doctors at [facility].  In the long run it would be nice to see him taper off of this and I think ultimately he will get recovery of the left-sided neuropathic symptoms.  I also want to start gabapentin at night.  He currently does take a anticonvulsant but this should not interfere with that.  I think the gabapentin can be titrated up depending on his neuropathic pain level.  This is likely due to resection of the tumor and the fact that the nerves were intertwined.  I do think these will recover particular from a peripheral nerve standpoint but it may take up to a year.  I am happy to see him for any other issues but I think right now his best position is to follow-up with the [place] physicians for right now.  I am happy that we caught this tumor however as it was rarely taking up a lot of space in the  spinal canal and I think ultimately he will do better having had that resected.    Meds & Orders:  Meds ordered this encounter  Medications  . methocarbamol (ROBAXIN) 750 MG tablet    Sig: Take 1 tablet (750 mg total) by mouth 3 (three) times daily.    Dispense:  30 tablet    Refill:  0  . oxyCODONE (ROXICODONE) 5 MG immediate release tablet    Sig: Take 1 tablet (5 mg  total) by mouth every 6 (six) hours as needed for severe pain.    Dispense:  60 tablet    Refill:  0  . gabapentin (NEURONTIN) 300 MG capsule    Sig: Take 1 capsule (300 mg total) by mouth at bedtime.    Dispense:  90 capsule    Refill:  3   No orders of the defined types were placed in this encounter.   Follow-up: Return if symptoms worsen or fail to improve.   Procedures: No procedures performed  No notes on file   Clinical History: MRI LUMBAR SPINE WITHOUT CONTRAST  TECHNIQUE: Multiplanar, multisequence MR imaging of the lumbar spine was performed. No intravenous contrast was administered.  COMPARISON:  None.  FINDINGS: Segmentation:  5 lumbar type vertebral bodies.  Alignment:  Normal  Vertebrae:  Normal  Conus medullaris and cauda equina: Conus extends to the T12 level. There is an intradural mass at the T12-L1 level extending over a length of 2.4 cm with a transverse diameter of 1.7 cm, filling the spinal canal and displacing the neural structures anteriorly and towards the right. This is probably an ependymoma. Differential diagnosis includes neurofibroma/schwannoma and metastasis.  Paraspinal and other soft tissues: No paraspinal soft tissue lesion is seen. Tissue in the left upper quadrant assumed to represent the spleen.  Disc levels:  No disc pathology at T11-12, T12-L1 or L1-2.  L2-3: Left posterolateral disc herniation with slight caudal migration behind L3. Stenosis of the left lateral recess which could cause left-sided neural compression.  L3-4: Disc bulge.  No compressive stenosis.  L4-5: Chronic disc degeneration with loss of disc height, endplate osteophytes and mild bulging of the disc. Mild facet and ligamentous hypertrophy. Mild stenosis of the lateral recesses but without likely neural compression.  L5-S1: Mild bulging of the disc. Mild facet degeneration. No stenosis.  IMPRESSION: 2.4 x 1.7 x 1.7 cm intradural  extramedullary mass at the T12-L1 level which is the level of the distal cord/conus/cauda. Neural structures are compressed anteriorly and towards the right in the spinal canal. Most likely diagnosis is ependymoma, though schwannoma and metastatic disease are considered. I would suggest postcontrast imaging to further characterize this abnormality. One might also consider screening the rest of the spinal canal.  Left posterolateral disc herniation at L2-3 with caudal migration behind L3, which could cause neural compression on the left.  Chronic, probably non-compressive degenerative changes L3-4, L4-5 and L5-S1.   Electronically Signed   By: Nelson Chimes M.D.   On: 03/19/2018 07:12    Exam: MRI C-spine without contrast 01/16/2017  - [place]  IMAGING TECHNIQUE: Multiplanar multisequence MRI images of the cervical spine was obtained WITHOUT IV contrast.  INDICATION: Neck pain.  COMPARISON: Cervical spine MRI 04/01/2014.  OBSERVATIONS:  Exam: MRI C-spine without contrast  IMAGING TECHNIQUE: Multiplanar multisequence MRI images of the cervical spine was obtained without IV contrast.  INDICATION: Neck pain  COMPARISON: None  OBSERVATIONS:  Redemonstration of the ACDF at C6-C7.   The vertebral bodies are well aligned, demonstrating preserved height. No significant spinal listhesis. No acute fractures or suspicious bone marrow lesions.  The cervicomedullary junction is unremarkable. The visualized spinal cord is normal in signal and caliber without compression.   Mild congenital spinal canal stenosis most prominent from C3 through C6.   Mild disc space narrowing from C3 through C6 and C7-T1.  The pre and paravertebral soft tissues are unremarkable.  C2-C3: Mild congenital canal stenosis, but no neuroforaminal narrowing. C3-C4: Disc bulge, resulting mild spinal canal stenosis and uncovertebral arthrosis, resulting in moderate left neuroforaminal narrowing. C4-C5: Disc  bulge, resulting mild spinal canal stenosis and mild bilateral neural foraminal narrowing. C5-C6: Disc bulge and posterior arch hypertrophy, resulting mild to moderate canal stenosis and bilateral neuroforaminal narrowing, moderate to severe on the right and moderate on the left.  C6-C7: No significant canal stenosis or neuroforaminal narrowing. C7-T1: Disc bulge, resulting mild sac compression, but no significant canal stenosis.  The imaged pre and paravertebral soft tissues look unremarkable.  Marland Kitchen IMPRESSION:   1. Redemonstration of ACDF at C6-C7.  2. Mild congenital spinal canal stenosis aggravated by mild to moderate cervical spondylosis, more pronounced at C5-C6, not significantly changed compared to prior   He  reports that he quit smoking about 25 years ago. His smoking use included cigarettes. He has never used smokeless tobacco.  Recent Labs    10/23/17  HGBA1C 5.3    Objective:  VS:  HT:    WT:   BMI:     BP:   HR: bpm  TEMP: ( )  RESP:  Physical Exam  Constitutional: He is oriented to person, place, and time. He appears well-developed and well-nourished. No distress.  HENT:  Head: Normocephalic and atraumatic.  Nose: Nose normal.  Mouth/Throat: Oropharynx is clear and moist.  Eyes: Pupils are equal, round, and reactive to light. Conjunctivae are normal.  Neck: Normal range of motion. Neck supple. No tracheal deviation present.  Cardiovascular: Regular rhythm and intact distal pulses.  Pulmonary/Chest: Effort normal and breath sounds normal.  Abdominal: Soft. He exhibits no distension. There is no rebound and no guarding.  Musculoskeletal: He exhibits no deformity.  Patient stands with a forward flexed spine and very tight hamstrings bilaterally.  He has full strength in the lower extremities bilaterally including dorsiflexion plantarflexion EHL.  He did not show any clonus on exam.  Trying to stretch out his hamstring though does cause spasming.  Neurological: He is  alert and oriented to person, place, and time. He exhibits normal muscle tone. Coordination normal.  Skin: Skin is warm. No rash noted.  Psychiatric: He has a normal mood and affect. His behavior is normal.  Nursing note and vitals reviewed.   Ortho Exam Imaging: No results found.  Past Medical/Family/Surgical/Social History: Medications & Allergies reviewed per EMR, new medications updated. Patient Active Problem List   Diagnosis Date Noted  . Intradural mass ([place]) 04/22/2018  . Anemia 11/21/2017  . Benign non-nodular prostatic hyperplasia with lower urinary tract symptoms 11/21/2017  . Erectile disorder, acquired, generalized, severe 11/21/2017  . Gastro-esophageal reflux disease with esophagitis 11/21/2017  . Lichen planus 53/61/4431  . Lipid disorder 11/21/2017  . Lumbar radicular pain 11/21/2017  . Migraine headache 11/21/2017  . Thyroid nodule 11/21/2017  . Glaucoma 11/21/2017  . Chronic myofascial pain 11/21/2017  . Peyronie disease 11/12/2016  . Incomplete right bundle branch block 10/18/2016  . GAD (generalized anxiety disorder) 03/29/2016  . Insomnia, persistent 03/29/2016  . MDD (major depressive disorder), recurrent episode, moderate ([place]) 03/29/2016  . Nondependent alcohol abuse, in remission 03/29/2016  . Cervical spondylosis without myelopathy 04/29/2014  . Degenerative disc disease, cervical 04/29/2014  . Status post cervical spinal fusion 04/29/2014  . Cervical stenosis of spinal canal 11/12/2008   Past Medical History:  Diagnosis Date  . Alcoholism ([place])   . Anemia 11/21/2017  . Anxiety   . Arthritis   . Benign non-nodular prostatic hyperplasia with lower urinary tract symptoms 11/21/2017  . Bilateral arm pain 04/29/2014  . Cervical spondylosis without myelopathy 04/29/2014  . Cervical stenosis of spinal canal 11/12/2008  . Chronic headaches   . Chronic myofascial pain 11/21/2017  . Constipation 11/21/2017  . Degenerative disc disease, cervical 04/29/2014  .  Depression   . Erectile disorder, acquired, generalized, severe 11/21/2017  . GAD (generalized anxiety disorder) 03/29/2016  . Gastro-esophageal reflux disease with esophagitis 11/21/2017  . Glaucoma 11/21/2017  . Incomplete right bundle branch block 10/18/2016  . Insomnia, persistent 03/29/2016  . Lichen planus, penis 11/21/2017  . Lipid disorder 11/21/2017  . Lumbar radicular pain 11/21/2017  . Nondependent alcohol abuse, in remission 03/29/2016  . Nonintractable migraine, unspecified migraine type 11/21/2017  . Peyronie disease 11/12/2016  . Status post cervical spinal  fusion 04/29/2014  . Thyroid nodule 11/21/2017   Family History  Problem Relation Age of Onset  . Heart disease Mother   . Irritable bowel syndrome Mother   . Diabetes Sister    Past Surgical History:  Procedure Laterality Date  . CERVICAL SPINE SURGERY  2010   c6-c7 fusion  . HERNIA REPAIR     Social History   Occupational History  . Occupation: Scientist, physiological: [employer]  Tobacco Use  . Smoking status: Former Smoker    Types: Cigarettes    Last attempt to quit: 1994    Years since quitting: 25.6  . Smokeless tobacco: Never Used  Substance and Sexual Activity  . Alcohol use: No    Frequency: Never  . Drug use: No  . Sexual activity: Not on file

## 2018-06-27 DIAGNOSIS — F5105 Insomnia due to other mental disorder: Secondary | ICD-10-CM | POA: Diagnosis not present

## 2018-06-27 DIAGNOSIS — F411 Generalized anxiety disorder: Secondary | ICD-10-CM | POA: Diagnosis not present

## 2018-06-27 DIAGNOSIS — F331 Major depressive disorder, recurrent, moderate: Secondary | ICD-10-CM | POA: Diagnosis not present

## 2018-06-30 ENCOUNTER — Telehealth: Payer: Self-pay | Admitting: Family Medicine

## 2018-06-30 ENCOUNTER — Ambulatory Visit: Payer: 59 | Admitting: Family Medicine

## 2018-06-30 DIAGNOSIS — G959 Disease of spinal cord, unspecified: Secondary | ICD-10-CM | POA: Diagnosis not present

## 2018-06-30 DIAGNOSIS — M5116 Intervertebral disc disorders with radiculopathy, lumbar region: Secondary | ICD-10-CM | POA: Diagnosis not present

## 2018-06-30 DIAGNOSIS — Z981 Arthrodesis status: Secondary | ICD-10-CM | POA: Diagnosis not present

## 2018-06-30 DIAGNOSIS — G9589 Other specified diseases of spinal cord: Secondary | ICD-10-CM | POA: Diagnosis not present

## 2018-06-30 NOTE — Telephone Encounter (Signed)
See note

## 2018-06-30 NOTE — Telephone Encounter (Signed)
Copied from [place] [id number]. Topic: Quick Communication - See Telephone Encounter >> Jun 30, 2018  2:13 PM Mylinda Latina, NT wrote: CRM for notification. See Telephone encounter for: 06/30/18. Anderson Malta calling from covermymeds states that the patient Galcanezumab-gnlm [facility]) 120 MG/ML SOAJ needs a prior auth, Please complete via the website.   Cb [phone] reference key : [id number]

## 2018-07-01 NOTE — Telephone Encounter (Signed)
Have you started this PA?

## 2018-07-03 DIAGNOSIS — G9589 Other specified diseases of spinal cord: Secondary | ICD-10-CM | POA: Diagnosis not present

## 2018-07-07 ENCOUNTER — Encounter (INDEPENDENT_AMBULATORY_CARE_PROVIDER_SITE_OTHER): Payer: Self-pay | Admitting: Physical Medicine and Rehabilitation

## 2018-07-07 DIAGNOSIS — M545 Low back pain: Secondary | ICD-10-CM | POA: Diagnosis not present

## 2018-07-07 DIAGNOSIS — G9589 Other specified diseases of spinal cord: Secondary | ICD-10-CM | POA: Diagnosis not present

## 2018-07-17 DIAGNOSIS — R3915 Urgency of urination: Secondary | ICD-10-CM | POA: Diagnosis not present

## 2018-07-17 DIAGNOSIS — N486 Induration penis plastica: Secondary | ICD-10-CM | POA: Diagnosis not present

## 2018-07-17 DIAGNOSIS — N5201 Erectile dysfunction due to arterial insufficiency: Secondary | ICD-10-CM | POA: Diagnosis not present

## 2018-07-21 ENCOUNTER — Encounter: Payer: Self-pay | Admitting: Family Medicine

## 2018-07-26 MED ORDER — METHOCARBAMOL 750 MG PO TABS: 750.0000 mg | ORAL_TABLET | Freq: Three times a day (TID) | ORAL | 0 refills | Status: DC

## 2018-07-31 DIAGNOSIS — N5201 Erectile dysfunction due to arterial insufficiency: Secondary | ICD-10-CM | POA: Diagnosis not present

## 2018-08-04 ENCOUNTER — Encounter: Payer: Self-pay | Admitting: Family Medicine

## 2018-08-04 DIAGNOSIS — M545 Low back pain: Secondary | ICD-10-CM | POA: Diagnosis not present

## 2018-08-04 DIAGNOSIS — G9589 Other specified diseases of spinal cord: Secondary | ICD-10-CM | POA: Diagnosis not present

## 2018-08-05 ENCOUNTER — Other Ambulatory Visit: Payer: Self-pay | Admitting: Family Medicine

## 2018-08-05 ENCOUNTER — Other Ambulatory Visit: Payer: Self-pay | Admitting: Surgical

## 2018-08-05 MED ORDER — GABAPENTIN 300 MG PO CAPS: 300.0000 mg | ORAL_CAPSULE | Freq: Three times a day (TID) | ORAL | 3 refills | Status: DC

## 2018-08-05 NOTE — Telephone Encounter (Signed)
Please advise on refill.

## 2018-08-19 ENCOUNTER — Ambulatory Visit (INDEPENDENT_AMBULATORY_CARE_PROVIDER_SITE_OTHER): Payer: 59 | Admitting: Neurology

## 2018-08-19 ENCOUNTER — Other Ambulatory Visit: Payer: Self-pay

## 2018-08-19 ENCOUNTER — Encounter: Payer: Self-pay | Admitting: Neurology

## 2018-08-19 VITALS — BP 123/79 | HR 68 | Resp 16 | Ht 76.0 in | Wt 217.0 lb

## 2018-08-19 DIAGNOSIS — G43C1 Periodic headache syndromes in child or adult, intractable: Secondary | ICD-10-CM

## 2018-08-19 MED ORDER — GABAPENTIN 300 MG PO CAPS: ORAL_CAPSULE | ORAL | 3 refills | Status: DC

## 2018-08-19 NOTE — Patient Instructions (Signed)
Increase the gabapentin to 300 mg twice during the day and 2 at night.  Neurontin (gabapentin) may result in drowsiness, ankle swelling, gait instability, or possibly dizziness. Please contact our office if significant side effects occur with this medication.

## 2018-08-19 NOTE — Progress Notes (Signed)
Reason for visit: Migraine headache, reported memory disturbance  Michael Garcia is an 62 y.o. male  History of present illness:  Michael Garcia is a 62 year old right-handed white male with a history of migraine headaches that are mediated through the occipital nerve on the right.  The patient has gained improvement with Emgality, he is having 7 or 8 headaches a month rather than 15 or 16.  The patient has in the past been tried on Topamax, Cymbalta, he is on trazodone, he is on gabapentin.  He has not gained any significant benefit with these medications.  Maxalt does work for the headache, he can cut the headache duration to about 4 hours with the use of Maxalt.  He does have some residual shoulder discomfort following a headache.  The patient has had a schwannoma resected from the conus medullaris, he has had some residual discomfort down the left leg, and altered sensation with the left leg.  He does have some mild gait instability, he has not had any falls.  He has not had any change in bowel bladder control.  The patient is not sleeping well in part because of the left leg pain.  He has reported increased fatigue, he is having cognitive changes associated with this, he has difficulty with remembering names for people, he has had word finding problems and difficulty with spelling.  His job does require that he be on call every 8th night.  The patient comes to this office for an evaluation.  Past Medical History:  Diagnosis Date  . Alcoholism ([place])   . Anemia 11/21/2017  . Anxiety   . Arthritis   . Benign non-nodular prostatic hyperplasia with lower urinary tract symptoms 11/21/2017  . Bilateral arm pain 04/29/2014  . Cervical spondylosis without myelopathy 04/29/2014  . Cervical stenosis of spinal canal 11/12/2008  . Chronic headaches   . Chronic myofascial pain 11/21/2017  . Constipation 11/21/2017  . Degenerative disc disease, cervical 04/29/2014  . Depression   . Erectile disorder,  acquired, generalized, severe 11/21/2017  . GAD (generalized anxiety disorder) 03/29/2016  . Gastro-esophageal reflux disease with esophagitis 11/21/2017  . Glaucoma 11/21/2017  . Incomplete right bundle branch block 10/18/2016  . Insomnia, persistent 03/29/2016  . Lichen planus, penis 11/21/2017  . Lipid disorder 11/21/2017  . Lumbar radicular pain 11/21/2017  . Nondependent alcohol abuse, in remission 03/29/2016  . Nonintractable migraine, unspecified migraine type 11/21/2017  . Peyronie disease 11/12/2016  . Status post cervical spinal fusion 04/29/2014  . Thyroid nodule 11/21/2017    Past Surgical History:  Procedure Laterality Date  . CERVICAL SPINE SURGERY  2010   c6-c7 fusion  . HERNIA REPAIR      Family History  Problem Relation Age of Onset  . Heart disease Mother   . Irritable bowel syndrome Mother   . Diabetes Sister     Social history:  reports that he quit smoking about 25 years ago. His smoking use included cigarettes. He has never used smokeless tobacco. He reports that he does not drink alcohol or use drugs.    Allergies  Allergen Reactions  . Bacitracin Itching and Rash  . Iodine Itching and Rash    IV Iodine   . Sulfamethoxazole-Trimethoprim Rash    Medications:  Prior to Admission medications   Medication Sig Start Date End Date Taking? Authorizing Provider  alfuzosin (UROXATRAL) 10 MG 24 hr tablet TAKE 1 TABLET BY MOUTH DAILY 08/05/18  Yes Briscoe Deutscher, DO  ALPRAZolam Duanne Moron) 1 MG  tablet  05/06/18  Yes [provider]  buPROPion (WELLBUTRIN SR) 150 MG 12 hr tablet Take 150 mg by mouth 2 (two) times daily.  05/19/18  Yes [provider]  cetirizine (ZYRTEC) 10 MG tablet Take 1 tablet by mouth daily.   Yes [provider]  Cyanocobalamin (B-12 COMPLIANCE INJECTION) 1000 MCG/ML KIT Inject 1,000 mcg as directed every 7 (seven) days.    Yes [provider]  esomeprazole (NEXIUM) 40 MG capsule Take 1 capsule (40 mg total) by  mouth daily. 03/17/18  Yes Briscoe Deutscher, DO  FIBER PO Take by mouth.   Yes [provider]  finasteride (PROSCAR) 5 MG tablet Take 1 tablet (5 mg total) by mouth daily. 04/17/18  Yes Kathrynn Ducking, MD  fluticasone Asencion Islam) 50 MCG/ACT nasal spray  09/30/16  Yes [provider]  gabapentin (NEURONTIN) 300 MG capsule Take 1 capsule (300 mg total) by mouth 3 (three) times daily. 08/05/18  Yes Briscoe Deutscher, DO  Galcanezumab-gnlm (EMGALITY) 120 MG/ML SOAJ Inject 120 mg into the skin as needed. 01/15/18  Yes Briscoe Deutscher, DO  lamoTRIgine (LAMICTAL) 200 MG tablet TK 1 T PO D 01/15/18  Yes Briscoe Deutscher, DO  lidocaine (LIDODERM) 5 % USE AS DIRECTED ONCE DAILY EXTERNALLY 01/15/18  Yes Briscoe Deutscher, DO  Lidocaine 5 % CREA    Yes [provider]  Melatonin 3 MG TABS    Yes [provider]  methocarbamol (ROBAXIN) 750 MG tablet Take 1 tablet (750 mg total) by mouth 3 (three) times daily. 07/26/18  Yes Briscoe Deutscher, DO  MULTIPLE VITAMIN PO Take by mouth.   Yes [provider]  rizatriptan (MAXALT) 10 MG tablet Take 1 tablet (10 mg total) by mouth as needed for migraine. 01/15/18  Yes Briscoe Deutscher, DO  sildenafil (VIAGRA) 100 MG tablet    Yes [provider]  tacrolimus (PROTOPIC) 0.1 % ointment APPLY TO AFFECTED AREAS UTD 07/24/17  Yes [provider]  traZODone (DESYREL) 100 MG tablet Take 1 tablet (100 mg total) by mouth at bedtime as needed for sleep. 01/15/18 02/14/19 Yes Briscoe Deutscher, DO  cyclobenzaprine (FLEXERIL) 10 MG tablet Take 1 tablet by mouth as needed. 06/07/15   [provider]  ondansetron (ZOFRAN-ODT) 4 MG disintegrating tablet  05/26/18   [provider]    ROS:  Out of a complete 14 system review of symptoms, the patient complains only of the following symptoms, and all other reviewed systems are negative.  Light sensitivity Neck pain, neck stiffness Memory loss, headache Decreased concentration,  depression  Blood pressure 123/79, pulse 68, resp. rate 16, height '6\' 4"'  (1.93 m), weight 217 lb (98.4 kg).  Physical Exam  General: The patient is alert and cooperative at the time of the examination.  Skin: No significant peripheral edema is noted.   Neurologic Exam  Mental status: The patient is alert and oriented x 3 at the time of the examination. The patient has apparent normal recent and remote memory, with an apparently normal attention span and concentration ability.  Mini-Mental status examination done today shows a total score 28/30.  The patient is able to name 21 four-legged animals in 60 seconds.   Cranial nerves: Facial symmetry is present. Speech is normal, no aphasia or dysarthria is noted. Extraocular movements are full. Visual fields are full.  Motor: The patient has good strength in all 4 extremities.  Sensory examination: Soft touch sensation is symmetric on the face, arms, and legs.  Coordination: The  patient has good finger-nose-finger and heel-to-shin bilaterally.  Gait and station: The patient has a normal gait. Tandem gait is slightly unsteady. Romberg is negative. No drift is seen.  The patient is able to walk on heels and the toes bilaterally.  Reflexes: Deep tendon reflexes are symmetric.   Assessment/Plan:  1.  Migraine headache, right occipital nerve mediated  2.  Schwannoma resection, conus medullaris  3.  Chronic left leg discomfort following surgery  4.  Reported memory disturbance  5.  Depression  The patient is having some difficulty with sleep in part related to the left leg discomfort, the gabapentin dose will be increased taking 300 mg twice during the day and 600 mg at night.  The patient will continue the Emgality.  He will call when he needs a refill on the medication.  The patient hopefully will gain some benefit with his migraine with increased dose of the gabapentin.  The patient is having trouble with sleep that may be  exacerbating his memory issue.  The patient also has underlying depression.  The patient will follow-up in 6 months.  We will follow the memory issues over time.  Jill Alexanders MD 08/19/2018 9:10 AM  [facility] [address] [place],  [address]  Phone [phone] Fax [phone]

## 2018-08-25 DIAGNOSIS — M7581 Other shoulder lesions, right shoulder: Secondary | ICD-10-CM | POA: Diagnosis not present

## 2018-08-25 DIAGNOSIS — M25511 Pain in right shoulder: Secondary | ICD-10-CM | POA: Diagnosis not present

## 2018-08-26 ENCOUNTER — Other Ambulatory Visit: Payer: Self-pay

## 2018-08-26 ENCOUNTER — Encounter: Payer: Self-pay | Admitting: Physical Therapy

## 2018-08-26 ENCOUNTER — Ambulatory Visit: Payer: 59 | Attending: Surgery | Admitting: Physical Therapy

## 2018-08-26 DIAGNOSIS — M25511 Pain in right shoulder: Secondary | ICD-10-CM | POA: Insufficient documentation

## 2018-08-26 DIAGNOSIS — G8929 Other chronic pain: Secondary | ICD-10-CM | POA: Insufficient documentation

## 2018-08-26 DIAGNOSIS — M25611 Stiffness of right shoulder, not elsewhere classified: Secondary | ICD-10-CM | POA: Diagnosis not present

## 2018-08-26 DIAGNOSIS — M6281 Muscle weakness (generalized): Secondary | ICD-10-CM | POA: Insufficient documentation

## 2018-08-26 NOTE — Patient Instructions (Signed)
Access Code: [email or web address]  URL: [email or web address]  Date: 08/26/2018  Prepared by: Blanche East   Exercises  Seated Cervical Retraction - 10 reps - 1 sets - 2x daily - 7x weekly  Scapular Retraction with Resistance - 10 reps - 2 sets - 2x daily - 7x weekly

## 2018-08-27 DIAGNOSIS — F331 Major depressive disorder, recurrent, moderate: Secondary | ICD-10-CM | POA: Diagnosis not present

## 2018-08-27 DIAGNOSIS — F411 Generalized anxiety disorder: Secondary | ICD-10-CM | POA: Diagnosis not present

## 2018-08-27 DIAGNOSIS — F5105 Insomnia due to other mental disorder: Secondary | ICD-10-CM | POA: Diagnosis not present

## 2018-08-27 NOTE — Addendum Note (Signed)
Addended by: Blanche East E on: 08/27/2018 11:21 AM   Modules accepted: Orders

## 2018-08-27 NOTE — Therapy (Addendum)
[place] MAIN [facility] SERVICES [address] [place], [place], [address] Phone: [phone]   Fax:  [phone]  Physical Therapy Evaluation  Patient Details  Name: Michael Garcia MRN: [id number] Date of Birth: 06/08/1956 Referring Provider (PT): Dr. Roland Rack   Encounter Date: 08/26/2018  PT End of Session - 08/27/18 0801    Visit Number  1    Number of Visits  9    Date for PT Re-Evaluation  09/24/18    Authorization Type  progress note 1/10; eval 08/26/18    PT Start Time  1631    PT Stop Time  1730    PT Time Calculation (min)  59 min    Activity Tolerance  Patient tolerated treatment well;Patient limited by pain    Behavior During Therapy  [facility] for tasks assessed/performed       Past Medical History:  Diagnosis Date  . Alcoholism ([place])   . Anemia 11/21/2017  . Anxiety   . Arthritis   . Benign non-nodular prostatic hyperplasia with lower urinary tract symptoms 11/21/2017  . Bilateral arm pain 04/29/2014  . Cervical spondylosis without myelopathy 04/29/2014  . Cervical stenosis of spinal canal 11/12/2008  . Chronic headaches   . Chronic myofascial pain 11/21/2017  . Constipation 11/21/2017  . Degenerative disc disease, cervical 04/29/2014  . Depression   . Erectile disorder, acquired, generalized, severe 11/21/2017  . GAD (generalized anxiety disorder) 03/29/2016  . Gastro-esophageal reflux disease with esophagitis 11/21/2017  . Glaucoma 11/21/2017  . Incomplete right bundle branch block 10/18/2016  . Insomnia, persistent 03/29/2016  . Lichen planus, penis 11/21/2017  . Lipid disorder 11/21/2017  . Lumbar radicular pain 11/21/2017  . Nondependent alcohol abuse, in remission 03/29/2016  . Nonintractable migraine, unspecified migraine type 11/21/2017  . Peyronie disease 11/12/2016  . Status post cervical spinal fusion 04/29/2014  . Thyroid nodule 11/21/2017    Past Surgical History:  Procedure Laterality Date  . CERVICAL SPINE SURGERY  2010    c6-c7 fusion  . HERNIA REPAIR      There were no vitals filed for this visit.   Subjective Assessment - 08/26/18 1631    Subjective  Patient reports he has chronic R shoulder pain leading to R sided migraines in the last decade. Pt has been unable to play tennis in the last 3-4 years due to increased pain following tennis as well as throwing activities.     Pertinent History  Patient is a 62 yo male with chief complaint of R shoulder pain. Patient reports he has had right shoulder pain for many years but does not recall any specific cause or injury. Pt does state he had a fall onto his right outstretched hand approximately 15 years ago; did break his elbow and had a minor RC tear that the doctors did not believe it was worth operating. Pt states he feels as though his shoulder pain has gotten worse in the past few years. Pt reports increased pain with sleeping, carrying heavy objects, reaching over head, and reaching behind his back. PMH significant for C6-7 ACDF for right cervical radiculopathy which he states resolved his symptoms as well as OA and GERD. CNS tumor at T11-T12, lamoplasty T12-L1 this summer.     Limitations  House hold activities    How long can you sit comfortably?  NA    How long can you stand comfortably?  NA    How long can you walk comfortably?  NA    Diagnostic tests  X-ray normal    Patient Stated Goals  "I'd love to play tennis, but that's probably not going to happen. I'd love to get it stronger and find a way to decrease referred migraines"    Currently in Pain?  Yes    Pain Score  4     Pain Location  Shoulder    Pain Orientation  Right    Pain Descriptors / Indicators  Aching;Sore;Tightness;Tiring    Pain Type  Chronic pain    Pain Radiating Towards  radiates up back of neck and into a migraine    Pain Onset  More than a month ago    Pain Frequency  Intermittent    Aggravating Factors   sleeping, carrying heavy objects, overhead activity, reaching behind back     Pain Relieving Factors  ice when sore (posterior), heat, ibuprofen, lidocaine patches/cream    Effect of Pain on Daily Activities  decreased housework in some areas, no impact on profession    Multiple Pain Sites  No         [facility] PT Assessment - 08/27/18 0001      Assessment   Medical Diagnosis  R shoulder pain    Referring Provider (PT)  Dr. Roland Rack    Onset Date/Surgical Date  10/02/98    Hand Dominance  Right    Next MD Visit  10/17/18    Prior Therapy  None- acupuncture for shoulder pain, cupping, massage      Precautions   Precautions  None      Restrictions   Weight Bearing Restrictions  No      Balance Screen   Has the patient fallen in the past 6 months  Yes    How many times?  2    Has the patient had a decrease in activity level because of a fear of falling?   No    Is the patient reluctant to leave their home because of a fear of falling?   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[employer]; sits majority of time    Leisure  Tennis, reiki (energy work), hot yoga, walking      Cognition   Overall Cognitive Status  Within Functional Limits for tasks assessed      Sensation   Light Touch  Appears Intact      Coordination   Gross Motor Movements are Fluid and Coordinated  Yes    Fine Motor Movements are Fluid and Coordinated  Yes      Posture/Postural Control   Posture Comments  Rounded shoulders, forward head      AROM   Overall  AROM Comments  LUE is WNL    Right Shoulder Extension  110 Degrees    Right Shoulder Flexion  165 Degrees  Right Shoulder ABduction  125 Degrees    Right Shoulder Internal Rotation  110 Degrees    Right Shoulder External Rotation  95 Degrees      Strength   Overall Strength Comments  LUE is WNL    Right Shoulder Flexion  5/5    Right Shoulder Extension  4+/5    Right Shoulder ABduction  4/5    Right Shoulder Internal Rotation  4/5    Right Shoulder External Rotation  4-/5      Palpation   Palpation comment  reported moderate tenderness along right upper trapezius/rhomboids and mild tenderness along right biceps; no tenderness reported on left side;       Neer Impingement test    Findings  Negative      Hawkins-Kennedy test   Findings  Positive    Side  Right    Comments  Painful      Empty Can test   Findings  Negative    Side  Right      Lag signs at 90 degrees    Findings  Negative    Side  Right    Comments  ER lag sign on R      Speed's test   Findings  Positive    Side  Right    Comment  Painful      Painful Arc of Motion   Side  Right      Transfers   Comments  transfers independently      Ambulation/Gait   Gait Comments  ambulates independently      High Level Balance   High Level Balance Comments  Patient exhibits normal static and dynamic standing balance                Objective measurements completed on examination: See above findings.   Treatment Instructed patient in HEP:  -Scapular retractions with red tband x10 reps bilaterally -Cervical retractions x10 reps  Pt required VCs and demonstration for proper technique as well as VCs to avoid painful ROM with cervical retractions; pt able to perform without VCs following several repetitions      PT Education - 08/27/18 0759    Education Details  plan of care, recommendations, HEP    Person(s) Educated  Patient    Methods  Explanation;Demonstration    Comprehension  Verbalized  understanding;Returned demonstration       PT Short Term Goals - 08/27/18 0815      PT SHORT TERM GOAL #1   Title  Patient will be independent in home exercise program to improve strength/mobility for better functional independence with ADLs.    Baseline  08/27/18: given HEP    Time  2    Period  Weeks    Status  New    Target Date  09/10/18        PT Long Term Goals - 08/27/18 0818      PT LONG TERM GOAL #1   Title  Patient will increase R shoulder abduction, ER, and IR to 4+/5 as to improve functional strength for overhead ADLs and playing tennis.    Baseline  08/27/18: ABD 4/5, IR 4/5, ER 4-/5    Time  4    Period  Weeks    Status  New    Target Date  09/24/18      PT LONG TERM GOAL #2   Title  Patient will decrease Quick DASH score by 10% to indicate increased functional ability related to shoulder pain and  mobility.    Baseline  08/27/18: 25%    Time  4    Period  Weeks    Status  New    Target Date  09/24/18      PT LONG TERM GOAL #3   Title  Pt will report no pain greater than 3/10 for improved functional ability and recreational ability to play tennis.    Baseline  08/27/18: 5/10    Time  4    Period  Weeks    Status  New    Target Date  09/24/18             Plan - 08/27/18 0835    Clinical Impression Statement  Michael Garcia is a 62 yo male with chief complaint of R shoulder pain. Pt has had R shoulder pain for the last 20 years but has noticed increased pain in the last few years. Pt presents with full AROM of shoulder but a positive painful arc with pain from 70 deg-160 deg abduction. Pt has forward head and rounded shoulders contributing to acromial impingement presentation; positive Hawkins-Kennedy and painful arc sign. Patient has decreased strength in shoulder abduction, IR, and ER with pain increased with resisted ABD and ER specifically. Patient has decreased Sutter mobility with increased stiffness in [place] inferior and AP glides; trigger points noted in upper  trap, rhomboids, and biceps on the R side. Patient has migraine headaches on R side related to R shoulder pain. Patient has tried acupuncture, massage therapy, and cupping for R shoulder muscle spasms and reports feeling greatest relief from cupping out of the three. Pt will benefit from skilled PT intervention for improvements in shoulder pain, mobility, and strength for improved QOL.     History and Personal Factors relevant to plan of care:  (+) age, motivated, active, no structural damage (-) chronicity of problem, comorbidities, migraines related to shoulder pain    Clinical Presentation  Stable    Clinical Presentation due to:  well controlled comorbidities, very active, chronicity of pain    Clinical Decision Making  Low    Rehab Potential  Fair    Clinical Impairments Affecting Rehab Potential  (+) age, motivated, active, no structural damage (-) chronicity of problem, migraines    PT Frequency  2x / week    PT Duration  4 weeks    PT Treatment/Interventions  Cryotherapy;Electrical Stimulation;Moist Heat;Ultrasound;Therapeutic activities;Therapeutic exercise;Patient/family education;Manual techniques;Dry needling    PT Next Visit Plan  GH mobilizations, soft tissue work    PT Home Exercise Plan  cervical retractions, scapular retractions    Consulted and Agree with Plan of Care  Patient       Patient will benefit from skilled therapeutic intervention in order to improve the following deficits and impairments:  Decreased strength, Hypomobility, Increased muscle spasms, Pain, Postural dysfunction  Visit Diagnosis: Chronic right shoulder pain  Stiffness of right shoulder, not elsewhere classified  Muscle weakness (generalized)     Problem List Patient Active Problem List   Diagnosis Date Noted  . Intradural mass ([place]) 04/22/2018  . Anemia 11/21/2017  . Benign non-nodular prostatic hyperplasia with lower urinary tract symptoms 11/21/2017  . Erectile dysfunction due to arterial  insufficiency 11/21/2017  . Gastro-esophageal reflux disease with esophagitis 11/21/2017  . Lichen planus 99/83/3825  . Lipid disorder 11/21/2017  . Lumbar radicular pain 11/21/2017  . Migraine headache 11/21/2017  . Thyroid nodule 11/21/2017  . Glaucoma 11/21/2017  . Chronic myofascial pain 11/21/2017  . Peyronie's disease 11/12/2016  . Incomplete  right bundle branch block 10/18/2016  . GAD (generalized anxiety disorder) 03/29/2016  . Insomnia, persistent 03/29/2016  . MDD (major depressive disorder), recurrent episode, moderate ([place]) 03/29/2016  . Nondependent alcohol abuse, in remission 03/29/2016  . Cervical spondylosis without myelopathy 04/29/2014  . Degenerative disc disease, cervical 04/29/2014  . Status post cervical spinal fusion 04/29/2014  . Cervical stenosis of spinal canal 11/12/2008   Harriet Masson, SPT This entire session was performed under direct supervision and direction of a licensed therapist/therapist assistant . I have personally read, edited and approve of the note as written.  Trotter,Margaret PT, DPT 08/27/2018, 11:15 AM  [place] MAIN [facility] SERVICES [address] [place], [place], [address] Phone: [phone]   Fax:  [phone]  Name: Michael Garcia MRN: [id number] Date of Birth: 1955-12-27

## 2018-08-28 ENCOUNTER — Ambulatory Visit: Payer: 59

## 2018-08-28 DIAGNOSIS — G8929 Other chronic pain: Secondary | ICD-10-CM | POA: Diagnosis not present

## 2018-08-28 DIAGNOSIS — M6281 Muscle weakness (generalized): Secondary | ICD-10-CM | POA: Diagnosis not present

## 2018-08-28 DIAGNOSIS — M25611 Stiffness of right shoulder, not elsewhere classified: Secondary | ICD-10-CM | POA: Diagnosis not present

## 2018-08-28 DIAGNOSIS — M25511 Pain in right shoulder: Secondary | ICD-10-CM | POA: Diagnosis not present

## 2018-08-28 NOTE — Therapy (Signed)
[place] MAIN [facility] SERVICES [address] [place], [place], [address] Phone: [phone]   Fax:  [phone]  Physical Therapy Treatment  Patient Details  Name: Michael Garcia MRN: [id number] Date of Birth: [birth year] Referring Provider (PT): Dr. Roland Rack   Encounter Date: 08/28/2018  PT End of Session - 08/28/18 1654    Visit Number  2    Number of Visits  9    Date for PT Re-Evaluation  09/24/18    Authorization Type  progress note 2/10; eval 08/26/18    PT Start Time  1650    PT Stop Time  1730    PT Time Calculation (min)  40 min    Activity Tolerance  Patient tolerated treatment well    Behavior During Therapy  [facility] for tasks assessed/performed       Past Medical History:  Diagnosis Date  . Alcoholism ([place])   . Anemia 11/21/2017  . Anxiety   . Arthritis   . Benign non-nodular prostatic hyperplasia with lower urinary tract symptoms 11/21/2017  . Bilateral arm pain 04/29/2014  . Cervical spondylosis without myelopathy 04/29/2014  . Cervical stenosis of spinal canal 11/12/2008  . Chronic headaches   . Chronic myofascial pain 11/21/2017  . Constipation 11/21/2017  . Degenerative disc disease, cervical 04/29/2014  . Depression   . Erectile disorder, acquired, generalized, severe 11/21/2017  . GAD (generalized anxiety disorder) 03/29/2016  . Gastro-esophageal reflux disease with esophagitis 11/21/2017  . Glaucoma 11/21/2017  . Incomplete right bundle branch block 10/18/2016  . Insomnia, persistent 03/29/2016  . Lichen planus, penis 11/21/2017  . Lipid disorder 11/21/2017  . Lumbar radicular pain 11/21/2017  . Nondependent alcohol abuse, in remission 03/29/2016  . Nonintractable migraine, unspecified migraine type 11/21/2017  . Peyronie disease 11/12/2016  . Status post cervical spinal fusion 04/29/2014  . Thyroid nodule 11/21/2017    Past Surgical History:  Procedure Laterality Date  . CERVICAL SPINE SURGERY  2010   c6-c7 fusion  .  HERNIA REPAIR      There were no vitals filed for this visit.  Subjective Assessment - 08/28/18 1650    Subjective  Pt reports that he is doing alright today. He rates his pain as a 2-3/10. No specific questions or concerns currently.      Pertinent History  Patient is a 62 yo male with chief complaint of R shoulder pain. Patient reports he has had right shoulder pain for many years but does not recall any specific cause or injury. Pt does state he had a fall onto his right outstretched hand approximately 15 years ago; did break his elbow and had a minor RC tear that the doctors did not believe it was worth operating. Pt states he feels as though his shoulder pain has gotten worse in the past few years. Pt reports increased pain with sleeping, carrying heavy objects, reaching over head, and reaching behind his back. PMH significant for C6-7 ACDF for right cervical radiculopathy which he states resolved his symptoms as well as OA and GERD. CNS tumor at T11-T12, lamoplasty T12-L1 this summer.     Limitations  House hold activities    How long can you sit comfortably?  NA    How long can you stand comfortably?  NA    How long can you walk comfortably?  NA    Diagnostic tests  X-ray normal    Patient Stated Goals  "I'd love to play tennis, but that's probably not going to happen. I'd  love to get it stronger and find a way to decrease referred migraines"    Currently in Pain?  Yes    Pain Score  3     Pain Location  Shoulder    Pain Orientation  Right    Pain Descriptors / Indicators  Aching    Pain Type  Chronic pain    Pain Onset  More than a month ago             Objective measurements completed on examination: See above findings.   TREATMENT  Ther-ex  Seated rows with red tband x 5, green tband x 10; Supine chin tucks with 5s hold x 10; Seated scapular retraction x 10 with tactile cues to assist with proper technique;  Manual Therapy  R shoulder grade II-III AP mobilizations,  30s/bout x 3 bouts, notable protraction of R shoulder compared to leftl; R shoulder grade II-III inferior mobilizations, 30s/bout x 3 bouts, mild increase in pain; Extensive STM to ant/post shoulder including levator, rhomboids, post/lat/ant deltoid, biceps and infraspinatus;  Trigger Point Dry Needling (TDN), unbilled Education performed with patient regarding potential benefit of TDN. Reviewed precautions and risks with patient. Reviewed special precautions/risks over lung fields which include pneumothorax. Reviewed signs and symptoms of pneumothorax and advised pt to go to ER immediately if these symptoms develop advise them of dry needling treatment. Extensive time spent with pt to ensure full understanding of TDN risks. Pt provided verbal consent to treatment. TDN performed to  with 0.30 x 50 single needle placements to R upper trap with local twitch response (LTR) during both placement. Pistoning technique utilized. Improved pain-free motion following intervention, especially into IR. Pt reports referred pain in ram's horn pattern during TDN of R upper trap.    Pt educated throughout session about proper posture and technique with exercises. Improved exercise technique, movement at target joints, use of target muscles after min to mod verbal, visual, tactile cues.    Pt with improved pain-free R shoulder scaption and IR at end of session. He demonstrates good technique with rows and scapular retraction but does require some tactile cues. Will ass response to TDN at next session to determine effectiveness. Consider TDN to rhomboids as well as infraspinatus. Pt will benefit from PT services to address deficits in strength and pain in order to return to full function at home and work with less pain.                       PT Short Term Goals - 08/27/18 0815      PT SHORT TERM GOAL #1   Title  Patient will be independent in home exercise program to improve strength/mobility for  better functional independence with ADLs.    Baseline  08/27/18: given HEP    Time  2    Period  Weeks    Status  New    Target Date  09/10/18        PT Long Term Goals - 08/27/18 0818      PT LONG TERM GOAL #1   Title  Patient will increase R shoulder abduction, ER, and IR to 4+/5 as to improve functional strength for overhead ADLs and playing tennis.    Baseline  08/27/18: ABD 4/5, IR 4/5, ER 4-/5    Time  4    Period  Weeks    Status  New    Target Date  09/24/18      PT LONG TERM GOAL #  2   Title  Patient will decrease Quick DASH score by 10% to indicate increased functional ability related to shoulder pain and mobility.    Baseline  08/27/18: 25%    Time  4    Period  Weeks    Status  New    Target Date  09/24/18      PT LONG TERM GOAL #3   Title  Pt will report no pain greater than 3/10 for improved functional ability and recreational ability to play tennis.    Baseline  08/27/18: 5/10    Time  4    Period  Weeks    Status  New    Target Date  09/24/18            Plan - 08/29/18 1319    Clinical Impression Statement  Pt with improved pain-free R shoulder scaption and IR at end of session. He demonstrates good technique with rows and scapular retraction but does require some tactile cues. Will ass response to TDN at next session to determine effectiveness. Consider TDN to rhomboids as well as infraspinatus. Pt will benefit from PT services to address deficits in strength and pain in order to return to full function at home and work with less pain.     Rehab Potential  Fair    Clinical Impairments Affecting Rehab Potential  (+) age, motivated, active, no structural damage (-) chronicity of problem, migraines    PT Frequency  2x / week    PT Duration  4 weeks    PT Treatment/Interventions  Cryotherapy;Electrical Stimulation;Moist Heat;Ultrasound;Therapeutic activities;Therapeutic exercise;Patient/family education;Manual techniques;Dry needling    PT Next Visit Plan   GH mobilizations, soft tissue work, repeat TDN if helpful    PT Home Exercise Plan  cervical retractions, scapular retractions    Consulted and Agree with Plan of Care  Patient       Patient will benefit from skilled therapeutic intervention in order to improve the following deficits and impairments:  Decreased strength, Hypomobility, Increased muscle spasms, Pain, Postural dysfunction  Visit Diagnosis: Chronic right shoulder pain  Stiffness of right shoulder, not elsewhere classified  Muscle weakness (generalized)     Problem List Patient Active Problem List   Diagnosis Date Noted  . Intradural mass ([place]) 04/22/2018  . Anemia 11/21/2017  . Benign non-nodular prostatic hyperplasia with lower urinary tract symptoms 11/21/2017  . Erectile dysfunction due to arterial insufficiency 11/21/2017  . Gastro-esophageal reflux disease with esophagitis 11/21/2017  . Lichen planus 29/52/8413  . Lipid disorder 11/21/2017  . Lumbar radicular pain 11/21/2017  . Migraine headache 11/21/2017  . Thyroid nodule 11/21/2017  . Glaucoma 11/21/2017  . Chronic myofascial pain 11/21/2017  . Peyronie's disease 11/12/2016  . Incomplete right bundle branch block 10/18/2016  . GAD (generalized anxiety disorder) 03/29/2016  . Insomnia, persistent 03/29/2016  . MDD (major depressive disorder), recurrent episode, moderate ([place]) 03/29/2016  . Nondependent alcohol abuse, in remission 03/29/2016  . Cervical spondylosis without myelopathy 04/29/2014  . Degenerative disc disease, cervical 04/29/2014  . Status post cervical spinal fusion 04/29/2014  . Cervical stenosis of spinal canal 11/12/2008   Lyndel Safe Markitta Ausburn PT, DPT, GCS  Fread Kottke 08/29/2018, 1:23 PM  [place] MAIN [facility] SERVICES [address] [place], [place], [address] Phone: [phone]   Fax:  [phone]  Name: Nolin Grell MRN: [id number] Date of Birth: [birth year]

## 2018-08-31 ENCOUNTER — Encounter: Payer: Self-pay | Admitting: Family Medicine

## 2018-09-01 ENCOUNTER — Other Ambulatory Visit: Payer: Self-pay | Admitting: Surgical

## 2018-09-01 MED ORDER — GABAPENTIN 300 MG PO CAPS: ORAL_CAPSULE | ORAL | 3 refills | Status: DC

## 2018-09-04 ENCOUNTER — Ambulatory Visit: Payer: 59

## 2018-09-04 DIAGNOSIS — M25611 Stiffness of right shoulder, not elsewhere classified: Secondary | ICD-10-CM

## 2018-09-04 DIAGNOSIS — M25511 Pain in right shoulder: Secondary | ICD-10-CM | POA: Diagnosis not present

## 2018-09-04 DIAGNOSIS — G8929 Other chronic pain: Secondary | ICD-10-CM | POA: Diagnosis not present

## 2018-09-04 DIAGNOSIS — M6281 Muscle weakness (generalized): Secondary | ICD-10-CM

## 2018-09-04 NOTE — Therapy (Signed)
[place] MAIN [facility] SERVICES [address] [place], [place], [address] Phone: [phone]   Fax:  [phone]  Physical Therapy Treatment  Patient Details  Name: Michael Garcia MRN: [id number] Date of Birth: July 31, 1956 Referring Provider (PT): Dr. Roland Rack   Encounter Date: 09/04/2018  PT End of Session - 09/05/18 0838    Visit Number  3    Number of Visits  9    Date for PT Re-Evaluation  09/24/18    Authorization Type  progress note 3/10; eval 08/26/18    PT Start Time  1700    PT Stop Time  1740    PT Time Calculation (min)  40 min    Activity Tolerance  Patient tolerated treatment well    Behavior During Therapy  [facility] for tasks assessed/performed       Past Medical History:  Diagnosis Date  . Alcoholism ([place])   . Anemia 11/21/2017  . Anxiety   . Arthritis   . Benign non-nodular prostatic hyperplasia with lower urinary tract symptoms 11/21/2017  . Bilateral arm pain 04/29/2014  . Cervical spondylosis without myelopathy 04/29/2014  . Cervical stenosis of spinal canal 11/12/2008  . Chronic headaches   . Chronic myofascial pain 11/21/2017  . Constipation 11/21/2017  . Degenerative disc disease, cervical 04/29/2014  . Depression   . Erectile disorder, acquired, generalized, severe 11/21/2017  . GAD (generalized anxiety disorder) 03/29/2016  . Gastro-esophageal reflux disease with esophagitis 11/21/2017  . Glaucoma 11/21/2017  . Incomplete right bundle branch block 10/18/2016  . Insomnia, persistent 03/29/2016  . Lichen planus, penis 11/21/2017  . Lipid disorder 11/21/2017  . Lumbar radicular pain 11/21/2017  . Nondependent alcohol abuse, in remission 03/29/2016  . Nonintractable migraine, unspecified migraine type 11/21/2017  . Peyronie disease 11/12/2016  . Status post cervical spinal fusion 04/29/2014  . Thyroid nodule 11/21/2017    Past Surgical History:  Procedure Laterality Date  . CERVICAL SPINE SURGERY  2010   c6-c7 fusion  .  HERNIA REPAIR      There were no vitals filed for this visit.  Subjective Assessment - 09/04/18 1658    Subjective  Pt reports that he is doing well today. Reports significant improvement in acute R shoulder pain. He rates his pain as a 2/10. No specific questions or concerns currently.    Pertinent History  Patient is a 62 yo male with chief complaint of R shoulder pain. Patient reports he has had right shoulder pain for many years but does not recall any specific cause or injury. Pt does state he had a fall onto his right outstretched hand approximately 15 years ago; did break his elbow and had a minor RC tear that the doctors did not believe it was worth operating. Pt states he feels as though his shoulder pain has gotten worse in the past few years. Pt reports increased pain with sleeping, carrying heavy objects, reaching over head, and reaching behind his back. PMH significant for C6-7 ACDF for right cervical radiculopathy which he states resolved his symptoms as well as OA and GERD. CNS tumor at T11-T12, lamoplasty T12-L1 this summer.     Limitations  House hold activities    How long can you sit comfortably?  NA    How long can you stand comfortably?  NA    How long can you walk comfortably?  NA    Diagnostic tests  X-ray normal    Patient Stated Goals  "I'd love to play tennis, but that's  probably not going to happen. I'd love to get it stronger and find a way to decrease referred migraines"    Currently in Pain?  Yes    Pain Score  2     Pain Location  Shoulder    Pain Descriptors / Indicators  Aching    Pain Type  Chronic pain    Pain Onset  More than a month ago          TREATMENT  Ther-ex  Supine chin tucks with 5s hold x 10;  Manual Therapy  R shoulder grade II-III AP mobilizations, 30s/bout x 3 bouts, notable protraction of R shoulder compared to left; R shoulder grade II-III inferior mobilizations, 30s/bout x 3 bouts, mild increase in pain; Extensive STM to ant/post  shoulder including levator, rhomboids, post/lat/ant deltoid, biceps and infraspinatus;  Trigger Point Dry Needling (TDN), unbilled Education performed with patient regarding potential benefit of TDN. Reviewed precautions and risks with patient. Pt provided verbal consent to treatment. TDN performed to with 2, 0.30 x 50 single needle placements to R upper trap with local twitch response (LTR) during both placement. Also performed 2 placements in rhomboid major/minor going all the way through to subscapularis as well as 1 placement in infraspinatus. Pistoning technique utilized for all placements. Improved pain-free motion following intervention,.    Pt educated throughout session about proper posture and technique with exercises. Improved exercise technique, movement at target joints, use of target muscles after min to mod verbal, visual, tactile cues.    Pt with improved pain-free R shoulder AROM following TDN and manual techniques. He demonstrates good technique  with chin tuckes. Will continue to asses response to TDN at next session to determine effectiveness. Will continue to progress to active strengthening. Pt will benefit from PT services to address deficits in strength and pain in order to return to full function at home and work with less pain.                          PT Short Term Goals - 08/27/18 0815      PT SHORT TERM GOAL #1   Title  Patient will be independent in home exercise program to improve strength/mobility for better functional independence with ADLs.    Baseline  08/27/18: given HEP    Time  2    Period  Weeks    Status  New    Target Date  09/10/18        PT Long Term Goals - 08/27/18 0818      PT LONG TERM GOAL #1   Title  Patient will increase R shoulder abduction, ER, and IR to 4+/5 as to improve functional strength for overhead ADLs and playing tennis.    Baseline  08/27/18: ABD 4/5, IR 4/5, ER 4-/5    Time  4    Period  Weeks     Status  New    Target Date  09/24/18      PT LONG TERM GOAL #2   Title  Patient will decrease Quick DASH score by 10% to indicate increased functional ability related to shoulder pain and mobility.    Baseline  08/27/18: 25%    Time  4    Period  Weeks    Status  New    Target Date  09/24/18      PT LONG TERM GOAL #3   Title  Pt will report no pain greater than 3/10 for improved  functional ability and recreational ability to play tennis.    Baseline  08/27/18: 5/10    Time  4    Period  Weeks    Status  New    Target Date  09/24/18            Plan - 09/05/18 2500    Clinical Impression Statement  Pt with improved pain-free R shoulder AROM following TDN and manual techniques. He demonstrates good technique  with chin tuckes. Will continue to asses response to TDN at next session to determine effectiveness. Will continue to progress to active strengthening. Pt will benefit from PT services to address deficits in strength and pain in order to return to full function at home and work with less pain.     Rehab Potential  Fair    Clinical Impairments Affecting Rehab Potential  (+) age, motivated, active, no structural damage (-) chronicity of problem, migraines    PT Frequency  2x / week    PT Duration  4 weeks    PT Treatment/Interventions  Cryotherapy;Electrical Stimulation;Moist Heat;Ultrasound;Therapeutic activities;Therapeutic exercise;Patient/family education;Manual techniques;Dry needling    PT Next Visit Plan  GH mobilizations, soft tissue work, repeat TDN if helpful    PT Home Exercise Plan  cervical retractions, scapular retractions    Consulted and Agree with Plan of Care  Patient       Patient will benefit from skilled therapeutic intervention in order to improve the following deficits and impairments:  Decreased strength, Hypomobility, Increased muscle spasms, Pain, Postural dysfunction  Visit Diagnosis: Chronic right shoulder pain  Stiffness of right shoulder, not  elsewhere classified  Muscle weakness (generalized)     Problem List Patient Active Problem List   Diagnosis Date Noted  . Intradural mass ([place]) 04/22/2018  . Anemia 11/21/2017  . Benign non-nodular prostatic hyperplasia with lower urinary tract symptoms 11/21/2017  . Erectile dysfunction due to arterial insufficiency 11/21/2017  . Gastro-esophageal reflux disease with esophagitis 11/21/2017  . Lichen planus 37/02/8888  . Lipid disorder 11/21/2017  . Lumbar radicular pain 11/21/2017  . Migraine headache 11/21/2017  . Thyroid nodule 11/21/2017  . Glaucoma 11/21/2017  . Chronic myofascial pain 11/21/2017  . Peyronie's disease 11/12/2016  . Incomplete right bundle branch block 10/18/2016  . GAD (generalized anxiety disorder) 03/29/2016  . Insomnia, persistent 03/29/2016  . MDD (major depressive disorder), recurrent episode, moderate ([place]) 03/29/2016  . Nondependent alcohol abuse, in remission 03/29/2016  . Cervical spondylosis without myelopathy 04/29/2014  . Degenerative disc disease, cervical 04/29/2014  . Status post cervical spinal fusion 04/29/2014  . Cervical stenosis of spinal canal 11/12/2008   Phillips Grout PT, DPT, GCS  Monesha Monreal 09/05/2018, 8:43 AM  [place] MAIN [facility] SERVICES [address] [place], [place], [address] Phone: [phone]   Fax:  [phone]  Name: Yousaf Sainato MRN: [id number] Date of Birth: [birth year]

## 2018-09-07 ENCOUNTER — Encounter: Payer: Self-pay | Admitting: Family Medicine

## 2018-09-08 ENCOUNTER — Other Ambulatory Visit: Payer: Self-pay | Admitting: Surgical

## 2018-09-08 MED ORDER — FLUTICASONE PROPIONATE 50 MCG/ACT NA SUSP: 2.0000 | Freq: Every day | NASAL | 5 refills | Status: DC

## 2018-09-09 DIAGNOSIS — L853 Xerosis cutis: Secondary | ICD-10-CM | POA: Diagnosis not present

## 2018-09-09 DIAGNOSIS — M5412 Radiculopathy, cervical region: Secondary | ICD-10-CM | POA: Diagnosis not present

## 2018-09-09 DIAGNOSIS — M50322 Other cervical disc degeneration at C5-C6 level: Secondary | ICD-10-CM | POA: Diagnosis not present

## 2018-09-09 DIAGNOSIS — L438 Other lichen planus: Secondary | ICD-10-CM | POA: Diagnosis not present

## 2018-09-09 DIAGNOSIS — M9901 Segmental and somatic dysfunction of cervical region: Secondary | ICD-10-CM | POA: Diagnosis not present

## 2018-09-09 DIAGNOSIS — M9900 Segmental and somatic dysfunction of head region: Secondary | ICD-10-CM | POA: Diagnosis not present

## 2018-09-09 DIAGNOSIS — M7918 Myalgia, other site: Secondary | ICD-10-CM | POA: Diagnosis not present

## 2018-09-09 DIAGNOSIS — M50323 Other cervical disc degeneration at C6-C7 level: Secondary | ICD-10-CM | POA: Diagnosis not present

## 2018-09-09 DIAGNOSIS — L304 Erythema intertrigo: Secondary | ICD-10-CM | POA: Diagnosis not present

## 2018-09-09 DIAGNOSIS — M5413 Radiculopathy, cervicothoracic region: Secondary | ICD-10-CM | POA: Diagnosis not present

## 2018-09-09 DIAGNOSIS — R51 Headache: Secondary | ICD-10-CM | POA: Diagnosis not present

## 2018-09-09 DIAGNOSIS — M542 Cervicalgia: Secondary | ICD-10-CM | POA: Diagnosis not present

## 2018-09-11 ENCOUNTER — Ambulatory Visit: Payer: 59

## 2018-09-11 DIAGNOSIS — N5319 Other ejaculatory dysfunction: Secondary | ICD-10-CM | POA: Insufficient documentation

## 2018-09-11 DIAGNOSIS — N528 Other male erectile dysfunction: Secondary | ICD-10-CM | POA: Diagnosis not present

## 2018-09-11 DIAGNOSIS — N35814 Other anterior urethral stricture, male: Secondary | ICD-10-CM | POA: Diagnosis not present

## 2018-09-11 DIAGNOSIS — N486 Induration penis plastica: Secondary | ICD-10-CM | POA: Diagnosis not present

## 2018-09-11 DIAGNOSIS — N35914 Unspecified anterior urethral stricture, male: Secondary | ICD-10-CM | POA: Insufficient documentation

## 2018-09-15 ENCOUNTER — Ambulatory Visit: Payer: 59 | Attending: Surgery

## 2018-09-15 DIAGNOSIS — G8929 Other chronic pain: Secondary | ICD-10-CM | POA: Insufficient documentation

## 2018-09-15 DIAGNOSIS — M25611 Stiffness of right shoulder, not elsewhere classified: Secondary | ICD-10-CM | POA: Diagnosis not present

## 2018-09-15 DIAGNOSIS — M25511 Pain in right shoulder: Secondary | ICD-10-CM | POA: Diagnosis not present

## 2018-09-15 DIAGNOSIS — M6281 Muscle weakness (generalized): Secondary | ICD-10-CM | POA: Insufficient documentation

## 2018-09-15 NOTE — Therapy (Signed)
[place] MAIN [facility] SERVICES [address] [place], [place], [address] Phone: [phone]   Fax:  [phone]  Physical Therapy Treatment  Patient Details  Name: Michael Garcia MRN: [id number] Date of Birth: 1956-03-30 Referring Provider (PT): Dr. Roland Rack   Encounter Date: 09/15/2018  PT End of Session - 09/16/18 1244    Visit Number  4    Number of Visits  9    Date for PT Re-Evaluation  09/24/18    Authorization Type  progress note 3/10; eval 08/26/18    PT Start Time  1645    PT Stop Time  1735    PT Time Calculation (min)  50 min    Activity Tolerance  Patient tolerated treatment well    Behavior During Therapy  [facility] for tasks assessed/performed       Past Medical History:  Diagnosis Date  . Alcoholism ([place])   . Anemia 11/21/2017  . Anxiety   . Arthritis   . Benign non-nodular prostatic hyperplasia with lower urinary tract symptoms 11/21/2017  . Bilateral arm pain 04/29/2014  . Cervical spondylosis without myelopathy 04/29/2014  . Cervical stenosis of spinal canal 11/12/2008  . Chronic headaches   . Chronic myofascial pain 11/21/2017  . Constipation 11/21/2017  . Degenerative disc disease, cervical 04/29/2014  . Depression   . Erectile disorder, acquired, generalized, severe 11/21/2017  . GAD (generalized anxiety disorder) 03/29/2016  . Gastro-esophageal reflux disease with esophagitis 11/21/2017  . Glaucoma 11/21/2017  . Incomplete right bundle branch block 10/18/2016  . Insomnia, persistent 03/29/2016  . Lichen planus, penis 11/21/2017  . Lipid disorder 11/21/2017  . Lumbar radicular pain 11/21/2017  . Nondependent alcohol abuse, in remission 03/29/2016  . Nonintractable migraine, unspecified migraine type 11/21/2017  . Peyronie disease 11/12/2016  . Status post cervical spinal fusion 04/29/2014  . Thyroid nodule 11/21/2017    Past Surgical History:  Procedure Laterality Date  . CERVICAL SPINE SURGERY  2010   c6-c7 fusion  . HERNIA  REPAIR      There were no vitals filed for this visit.  Subjective Assessment - 09/15/18 1650    Subjective  Pt reports that he is doing well today. Reports significant improvement in acute R shoulder pain. He rates his pain as a 4/10 at this time. He is having a migraine today but it has improved prior to coming to therapy. No specific questions or concerns currently.    Pertinent History  Patient is a 62 yo male with chief complaint of R shoulder pain. Patient reports he has had right shoulder pain for many years but does not recall any specific cause or injury. Pt does state he had a fall onto his right outstretched hand approximately 15 years ago; did break his elbow and had a minor RC tear that the doctors did not believe it was worth operating. Pt states he feels as though his shoulder pain has gotten worse in the past few years. Pt reports increased pain with sleeping, carrying heavy objects, reaching over head, and reaching behind his back. PMH significant for C6-7 ACDF for right cervical radiculopathy which he states resolved his symptoms as well as OA and GERD. CNS tumor at T11-T12, lamoplasty T12-L1 this summer.     Limitations  House hold activities    How long can you sit comfortably?  NA    How long can you stand comfortably?  NA    How long can you walk comfortably?  NA    Diagnostic  tests  X-ray normal    Patient Stated Goals  "I'd love to play tennis, but that's probably not going to happen. I'd love to get it stronger and find a way to decrease referred migraines"    Currently in Pain?  Yes    Pain Score  4     Pain Location  Shoulder    Pain Orientation  Right    Pain Descriptors / Indicators  Aching    Pain Type  Chronic pain    Pain Onset  More than a month ago           TREATMENT   Manual Therapy ExtensiveIASTM and trigger point release to mid and lower R thoracic paraspinals, posterior shoulderincluding upper trap, levator, rhomboids, posterior deltoid, and  infraspinatus; Therapeutic neuroscience education performed with patient today regarding chronic pain treatment techniques, laterality training, visualization, mirror training, and graded exercise. Discussed etiology of chronic pain.    Trigger Point Dry Needling (TDN), unbilled Education performed with patient regarding potential benefit of TDN. Reviewed precautions and risks with patient. Pt provided verbal consent to treatment. TDN performed to with 2, 0.30x 50single needle placements to R upper trapwith local twitch response (LTR) during both placement. Also performed 1 placements in R levator and R rhomboid major/minor. With rhomboid TDN utilized rib block techniqueg. Pistoning technique utilized for all placements. Improved pain-free motion following intervention,.    Pt educated throughout session about proper posture and technique with exercises. Improved exercise technique, movement at target joints, use of target muscles after min to mod verbal, visual, tactile cues.   Pt continues to experience pain-free R shoulder AROM following TDN and manual techniques. Therapeutic neuroscience education performed with patient today regarding chronic pain treatment techniques, laterality training, visualization, mirror training, and graded exercise. Will continue to asses response to TDN at next session to determine effectiveness. Will continue to progress to active strengthening.Pt will benefit from PT services to address deficits in strengthand painin order to return to full function at homeand work with less pain.                      PT Short Term Goals - 08/27/18 0815      PT SHORT TERM GOAL #1   Title  Patient will be independent in home exercise program to improve strength/mobility for better functional independence with ADLs.    Baseline  08/27/18: given HEP    Time  2    Period  Weeks    Status  New    Target Date  09/10/18        PT Long Term Goals  - 08/27/18 0818      PT LONG TERM GOAL #1   Title  Patient will increase R shoulder abduction, ER, and IR to 4+/5 as to improve functional strength for overhead ADLs and playing tennis.    Baseline  08/27/18: ABD 4/5, IR 4/5, ER 4-/5    Time  4    Period  Weeks    Status  New    Target Date  09/24/18      PT LONG TERM GOAL #2   Title  Patient will decrease Quick DASH score by 10% to indicate increased functional ability related to shoulder pain and mobility.    Baseline  08/27/18: 25%    Time  4    Period  Weeks    Status  New    Target Date  09/24/18      PT LONG  TERM GOAL #3   Title  Pt will report no pain greater than 3/10 for improved functional ability and recreational ability to play tennis.    Baseline  08/27/18: 5/10    Time  4    Period  Weeks    Status  New    Target Date  09/24/18            Plan - 09/16/18 1244    Clinical Impression Statement  Pt continues to experience pain-free R shoulder AROM following TDN and manual techniques. Therapeutic neuroscience education performed with patient today regarding chronic pain treatment techniques, laterality training, visualization, mirror training, and graded exercise.. Will continue to asses response to TDN at next session to determine effectiveness. Will continue to progress to active strengthening.Pt will benefit from PT services to address deficits in strengthand painin order to return to full function at homeand work with less pain.    Rehab Potential  Fair    Clinical Impairments Affecting Rehab Potential  (+) age, motivated, active, no structural damage (-) chronicity of problem, migraines    PT Frequency  2x / week    PT Duration  4 weeks    PT Treatment/Interventions  Cryotherapy;Electrical Stimulation;Moist Heat;Ultrasound;Therapeutic activities;Therapeutic exercise;Patient/family education;Manual techniques;Dry needling    PT Next Visit Plan  GH mobilizations, soft tissue work, repeat TDN if helpful, TNE     PT Home Exercise Plan  cervical retractions, scapular retractions    Consulted and Agree with Plan of Care  Patient       Patient will benefit from skilled therapeutic intervention in order to improve the following deficits and impairments:  Decreased strength, Hypomobility, Increased muscle spasms, Pain, Postural dysfunction  Visit Diagnosis: Stiffness of right shoulder, not elsewhere classified  Chronic right shoulder pain  Muscle weakness (generalized)     Problem List Patient Active Problem List   Diagnosis Date Noted  . Intradural mass ([place]) 04/22/2018  . Anemia 11/21/2017  . Benign non-nodular prostatic hyperplasia with lower urinary tract symptoms 11/21/2017  . Erectile dysfunction due to arterial insufficiency 11/21/2017  . Gastro-esophageal reflux disease with esophagitis 11/21/2017  . Lichen planus 67/20/9470  . Lipid disorder 11/21/2017  . Lumbar radicular pain 11/21/2017  . Migraine headache 11/21/2017  . Thyroid nodule 11/21/2017  . Glaucoma 11/21/2017  . Chronic myofascial pain 11/21/2017  . Peyronie's disease 11/12/2016  . Incomplete right bundle branch block 10/18/2016  . GAD (generalized anxiety disorder) 03/29/2016  . Insomnia, persistent 03/29/2016  . MDD (major depressive disorder), recurrent episode, moderate ([place]) 03/29/2016  . Nondependent alcohol abuse, in remission 03/29/2016  . Cervical spondylosis without myelopathy 04/29/2014  . Degenerative disc disease, cervical 04/29/2014  . Status post cervical spinal fusion 04/29/2014  . Cervical stenosis of spinal canal 11/12/2008   Lyndel Safe Amybeth Sieg PT, DPT, GCS  Ulis Kaps 09/16/2018, 12:50 PM  [place] MAIN [facility] SERVICES [address] [place], [place], [address] Phone: [phone]   Fax:  [phone]  Name: Michael Garcia MRN: [id number] Date of Birth: 1956-04-03

## 2018-09-17 ENCOUNTER — Ambulatory Visit: Payer: 59

## 2018-09-17 DIAGNOSIS — M6281 Muscle weakness (generalized): Secondary | ICD-10-CM

## 2018-09-17 DIAGNOSIS — G8929 Other chronic pain: Secondary | ICD-10-CM

## 2018-09-17 DIAGNOSIS — M25511 Pain in right shoulder: Secondary | ICD-10-CM

## 2018-09-17 DIAGNOSIS — M25611 Stiffness of right shoulder, not elsewhere classified: Secondary | ICD-10-CM | POA: Diagnosis not present

## 2018-09-17 NOTE — Therapy (Signed)
[place] MAIN [facility] SERVICES [address] [place], [place], [address] Phone: [phone]   Fax:  [phone]  Physical Therapy Treatment  Patient Details  Name: Michael Garcia MRN: [id number] Date of Birth: 1956/04/18 Referring Provider (PT): Dr. Roland Rack   Encounter Date: 09/17/2018  PT End of Session - 09/19/18 0835    Visit Number  5    Number of Visits  9    Date for PT Re-Evaluation  09/24/18    Authorization Type  progress note 3/10; eval 08/26/18    PT Start Time  1696    PT Stop Time  1735    PT Time Calculation (min)  48 min    Activity Tolerance  Patient tolerated treatment well    Behavior During Therapy  [facility] for tasks assessed/performed       Past Medical History:  Diagnosis Date  . Alcoholism ([place])   . Anemia 11/21/2017  . Anxiety   . Arthritis   . Benign non-nodular prostatic hyperplasia with lower urinary tract symptoms 11/21/2017  . Bilateral arm pain 04/29/2014  . Cervical spondylosis without myelopathy 04/29/2014  . Cervical stenosis of spinal canal 11/12/2008  . Chronic headaches   . Chronic myofascial pain 11/21/2017  . Constipation 11/21/2017  . Degenerative disc disease, cervical 04/29/2014  . Depression   . Erectile disorder, acquired, generalized, severe 11/21/2017  . GAD (generalized anxiety disorder) 03/29/2016  . Gastro-esophageal reflux disease with esophagitis 11/21/2017  . Glaucoma 11/21/2017  . Incomplete right bundle branch block 10/18/2016  . Insomnia, persistent 03/29/2016  . Lichen planus, penis 11/21/2017  . Lipid disorder 11/21/2017  . Lumbar radicular pain 11/21/2017  . Nondependent alcohol abuse, in remission 03/29/2016  . Nonintractable migraine, unspecified migraine type 11/21/2017  . Peyronie disease 11/12/2016  . Status post cervical spinal fusion 04/29/2014  . Thyroid nodule 11/21/2017    Past Surgical History:  Procedure Laterality Date  . CERVICAL SPINE SURGERY  2010   c6-c7 fusion  . HERNIA  REPAIR      There were no vitals filed for this visit.  Subjective Assessment - 09/19/18 0833    Subjective  Pt reports that he is doing well today. Reports continued improvement in acute R shoulder pain but is still sore today from TDN during last visit. He denies resting pain at this time. He is still having a migraine. No specific questions or concerns currently.    Pertinent History  Patient is a 62 yo male with chief complaint of R shoulder pain. Patient reports he has had right shoulder pain for many years but does not recall any specific cause or injury. Pt does state he had a fall onto his right outstretched hand approximately 15 years ago; did break his elbow and had a minor RC tear that the doctors did not believe it was worth operating. Pt states he feels as though his shoulder pain has gotten worse in the past few years. Pt reports increased pain with sleeping, carrying heavy objects, reaching over head, and reaching behind his back. PMH significant for C6-7 ACDF for right cervical radiculopathy which he states resolved his symptoms as well as OA and GERD. CNS tumor at T11-T12, lamoplasty T12-L1 this summer.     Limitations  House hold activities    How long can you sit comfortably?  NA    How long can you stand comfortably?  NA    How long can you walk comfortably?  NA    Diagnostic tests  X-ray normal    Patient Stated Goals  "I'd love to play tennis, but that's probably not going to happen. I'd love to get it stronger and find a way to decrease referred migraines"    Currently in Pain?  No/denies              TREATMENT   Manual Therapy R shoulder PROM flexion, abduction, ER, and IR with end range holds and gentle distraction/oscillation to minimize muscle guarding; R shoulder grade II-III AP mobs at neutral 30s/bout x 3 bouts; R shoulder grade II-III inferior mobs at 90 degrees abduction 30s/bout x 3 bouts; Therapeutic neuroscience education performed with patient  today during manual techniques regarding chronic pain treatment and encouraged pt to download the Recognise shoulder app to his phone and start to utilize.   Ther-ex  Supine R shoulder flexion with 2# dumbbell 2 x 10; Supine R shoulder serratus punch with manual resistance 2 x 10; Supine R shoulder rhythmic stabilization at 90 flexion 30s x 2; L sidelying R shoulder abduction with 2# dumbbell 2 x 10; L sidelying R shoulder ER with gentle manual resistance 2 x 10, attempted with dumbbell initially but mildly painful Prone R shoulder extension to neutral with 2# dumbell x 10; Prone R shoulder horizontal abduction unweighted x 10 with cues for scap retraction; Prone R shoulder upper trap flexion (Y's) unweighted x 10 with cues for scap retraction;   Pt educated throughout session about proper posture and technique with exercises. Improved exercise technique, movement at target joints, use of target muscles after min to mod verbal, visual, tactile cues.   Pt reports mild soreness in R shoulder today from TDN during last session so today focused on strengthening as pt denies resting pain upon arrival. Pt demonstrates ability to perform pain-free strengthening with the exception of some mild pain with weighted sidelying ER. Therapeutic neuroscience education performed again with patient today during manual techniques regarding chronic pain treatment and encouraged pt to download the Recognise shoulder app to his phone and start to utilize. Will continue to progress to active strengthening as tolerated.Pt will benefit from PT services to address deficits in strengthand painin order to return to full function at homeand work with less pain.                    PT Short Term Goals - 08/27/18 0815      PT SHORT TERM GOAL #1   Title  Patient will be independent in home exercise program to improve strength/mobility for better functional independence with ADLs.    Baseline   08/27/18: given HEP    Time  2    Period  Weeks    Status  New    Target Date  09/10/18        PT Long Term Goals - 08/27/18 0818      PT LONG TERM GOAL #1   Title  Patient will increase R shoulder abduction, ER, and IR to 4+/5 as to improve functional strength for overhead ADLs and playing tennis.    Baseline  08/27/18: ABD 4/5, IR 4/5, ER 4-/5    Time  4    Period  Weeks    Status  New    Target Date  09/24/18      PT LONG TERM GOAL #2   Title  Patient will decrease Quick DASH score by 10% to indicate increased functional ability related to shoulder pain and mobility.    Baseline  08/27/18: 25%  Time  4    Period  Weeks    Status  New    Target Date  09/24/18      PT LONG TERM GOAL #3   Title  Pt will report no pain greater than 3/10 for improved functional ability and recreational ability to play tennis.    Baseline  08/27/18: 5/10    Time  4    Period  Weeks    Status  New    Target Date  09/24/18            Plan - 09/19/18 0836    Clinical Impression Statement  Pt reports mild soreness in R shoulder today from TDN during last session so today focused on strengthening as pt denies resting pain upon arrival. Pt demonstrates ability to perform pain-free strengthening with the exception of some mild pain with weighted sidelying ER. Therapeutic neuroscience education performed again with patient today during manual techniques regarding chronic pain treatment and encouraged pt to download the Recognise shoulder app to his phone and start to utilize. Will continue to progress to active strengthening as tolerated.Pt will benefit from PT services to address deficits in strengthand painin order to return to full function at homeand work with less pain.    Rehab Potential  Fair    Clinical Impairments Affecting Rehab Potential  (+) age, motivated, active, no structural damage (-) chronicity of problem, migraines    PT Frequency  2x / week    PT Duration  4 weeks     PT Treatment/Interventions  Cryotherapy;Electrical Stimulation;Moist Heat;Ultrasound;Therapeutic activities;Therapeutic exercise;Patient/family education;Manual techniques;Dry needling    PT Next Visit Plan  GH mobilizations, soft tissue work, repeat TDN if helpful, TNE    PT Home Exercise Plan  cervical retractions, scapular retractions    Consulted and Agree with Plan of Care  Patient       Patient will benefit from skilled therapeutic intervention in order to improve the following deficits and impairments:  Decreased strength, Hypomobility, Increased muscle spasms, Pain, Postural dysfunction  Visit Diagnosis: Stiffness of right shoulder, not elsewhere classified  Chronic right shoulder pain  Muscle weakness (generalized)     Problem List Patient Active Problem List   Diagnosis Date Noted  . Intradural mass ([place]) 04/22/2018  . Anemia 11/21/2017  . Benign non-nodular prostatic hyperplasia with lower urinary tract symptoms 11/21/2017  . Erectile dysfunction due to arterial insufficiency 11/21/2017  . Gastro-esophageal reflux disease with esophagitis 11/21/2017  . Lichen planus 18/84/1660  . Lipid disorder 11/21/2017  . Lumbar radicular pain 11/21/2017  . Migraine headache 11/21/2017  . Thyroid nodule 11/21/2017  . Glaucoma 11/21/2017  . Chronic myofascial pain 11/21/2017  . Peyronie's disease 11/12/2016  . Incomplete right bundle branch block 10/18/2016  . GAD (generalized anxiety disorder) 03/29/2016  . Insomnia, persistent 03/29/2016  . MDD (major depressive disorder), recurrent episode, moderate ([place]) 03/29/2016  . Nondependent alcohol abuse, in remission 03/29/2016  . Cervical spondylosis without myelopathy 04/29/2014  . Degenerative disc disease, cervical 04/29/2014  . Status post cervical spinal fusion 04/29/2014  . Cervical stenosis of spinal canal 11/12/2008   Lyndel Safe Sargent Mankey PT, DPT, GCS  Derisha Funderburke 09/19/2018, 8:44 AM  [place] MAIN [facility] SERVICES [address] [place], [place], [address] Phone: [phone]   Fax:  [phone]  Name: Michael Garcia MRN: [id number] Date of Birth: 1956-03-25

## 2018-09-26 ENCOUNTER — Other Ambulatory Visit: Payer: Self-pay | Admitting: Family Medicine

## 2018-09-30 ENCOUNTER — Encounter: Payer: Self-pay | Admitting: Family Medicine

## 2018-09-30 ENCOUNTER — Ambulatory Visit: Payer: 59 | Admitting: Physical Therapy

## 2018-09-30 ENCOUNTER — Ambulatory Visit (INDEPENDENT_AMBULATORY_CARE_PROVIDER_SITE_OTHER): Payer: 59 | Admitting: Family Medicine

## 2018-09-30 VITALS — BP 110/62 | HR 75 | Temp 98.0°F | Ht 76.0 in | Wt 221.4 lb

## 2018-09-30 DIAGNOSIS — N486 Induration penis plastica: Secondary | ICD-10-CM | POA: Diagnosis not present

## 2018-09-30 DIAGNOSIS — M6281 Muscle weakness (generalized): Secondary | ICD-10-CM

## 2018-09-30 DIAGNOSIS — N401 Enlarged prostate with lower urinary tract symptoms: Secondary | ICD-10-CM

## 2018-09-30 DIAGNOSIS — M7918 Myalgia, other site: Secondary | ICD-10-CM

## 2018-09-30 DIAGNOSIS — M25511 Pain in right shoulder: Secondary | ICD-10-CM

## 2018-09-30 DIAGNOSIS — M25611 Stiffness of right shoulder, not elsewhere classified: Secondary | ICD-10-CM

## 2018-09-30 DIAGNOSIS — G8929 Other chronic pain: Secondary | ICD-10-CM

## 2018-09-30 MED ORDER — METHOCARBAMOL 750 MG PO TABS: 750.0000 mg | ORAL_TABLET | Freq: Three times a day (TID) | ORAL | 3 refills | Status: DC

## 2018-09-30 NOTE — Progress Notes (Signed)
Michael Garcia is a 62 y.o. male is here for follow up.  History of Present Illness:   [name], CMA acting as scribe for Dr. Briscoe Deutscher.   HPI: Patient in office for medication refill. His PMH includes GERD, depression, migraines, and back pain. PSH includes C6/C7 fusion, left knee pain, left and right inguinal hernia repairs, and benign CNS tumor removal at T11-L1. He is a former smoker 1.5PPD x 20 years.   He was not happy with [facility] so has started seeing Dr. Odis Luster with [facility] on 09/11/18. Surgery recommended. He will wait until insurance covers.   Back pain improved. Still with paresthesias. Taking Robaxin scheduled. Walking 10,000 steps daily. No issues with steps. PT for shoulder.   There are no preventive care reminders to display for this patient. Depression screen [facility] 2/9 09/30/2018 05/05/2018 11/21/2017  Decreased Interest 1 1 0  Down, Depressed, Hopeless '1 1 1  ' PHQ - 2 Score '2 2 1  ' Altered sleeping 2 0 3  Tired, decreased energy 1 1 0  Change in appetite 2 1 0  Feeling bad or failure about yourself  0 1 0  Trouble concentrating '1 1 1  ' Moving slowly or fidgety/restless 0 0 0  Suicidal thoughts 0 0 0  PHQ-9 Score '8 6 5  ' Difficult doing work/chores Not difficult at all - -   PMHx, SurgHx, SocialHx, FamHx, Medications, and Allergies were reviewed in the Visit Navigator and updated as appropriate.   Patient Active Problem List   Diagnosis Date Noted  . Intradural mass ([place]) 04/22/2018  . Anemia 11/21/2017  . Benign non-nodular prostatic hyperplasia with lower urinary tract symptoms 11/21/2017  . Erectile dysfunction due to arterial insufficiency 11/21/2017  . Gastro-esophageal reflux disease with esophagitis 11/21/2017  . Lichen planus 32/35/5732  . Lipid disorder 11/21/2017  . Lumbar radicular pain 11/21/2017  . Migraine headache 11/21/2017  . Thyroid nodule 11/21/2017  . Glaucoma 11/21/2017  . Chronic myofascial pain 11/21/2017  . Peyronie's  disease 11/12/2016  . Incomplete right bundle branch block 10/18/2016  . GAD (generalized anxiety disorder) 03/29/2016  . Insomnia, persistent 03/29/2016  . MDD (major depressive disorder), recurrent episode, moderate ([place]) 03/29/2016  . Nondependent alcohol abuse, in remission 03/29/2016  . Cervical spondylosis without myelopathy 04/29/2014  . Degenerative disc disease, cervical 04/29/2014  . Status post cervical spinal fusion 04/29/2014  . Cervical stenosis of spinal canal 11/12/2008   Social History   Tobacco Use  . Smoking status: Former Smoker    Types: Cigarettes    Last attempt to quit: 1994    Years since quitting: 25.8  . Smokeless tobacco: Never Used  Substance Use Topics  . Alcohol use: No    Frequency: Never  . Drug use: No   Current Medications and Allergies:   .  alfuzosin (UROXATRAL) 10 MG 24 hr tablet, TAKE 1 TABLET BY MOUTH DAILY, Disp: 30 tablet, Rfl: 2 .  ALPRAZolam (XANAX) 1 MG tablet, , Disp: , Rfl: 2 .  buPROPion (WELLBUTRIN SR) 150 MG 12 hr tablet, Take 150 mg by mouth 2 (two) times daily. , Disp: , Rfl: 0 .  cetirizine (ZYRTEC) 10 MG tablet, Take 1 tablet by mouth daily., Disp: , Rfl:  .  Cyanocobalamin (B-12 COMPLIANCE INJECTION) 1000 MCG/ML KIT, Inject 1,000 mcg as directed every 7 (seven) days. , Disp: , Rfl:  .  cyclobenzaprine (FLEXERIL) 10 MG tablet, Take 1 tablet by mouth as needed., Disp: , Rfl:  .  esomeprazole (NEXIUM) 40 MG  capsule, Take 1 capsule (40 mg total) by mouth daily., Disp: 90 capsule, Rfl: 2 .  FIBER PO, Take by mouth., Disp: , Rfl:  .  finasteride (PROSCAR) 5 MG tablet, Take 1 tablet (5 mg total) by mouth daily., Disp: , Rfl:  .  fluticasone (FLONASE) 50 MCG/ACT nasal spray, Place 2 sprays into both nostrils daily., Disp: 16 g, Rfl: 5 .  gabapentin (NEURONTIN) 300 MG capsule, One capsule twice during the day and 2 at night, Disp: 120 capsule, Rfl: 3 .  Galcanezumab-gnlm (EMGALITY) 120 MG/ML SOAJ, Inject 120 mg into the skin as  needed., Disp: 1 mL, Rfl: 3 .  lamoTRIgine (LAMICTAL) 200 MG tablet, TK 1 T PO D, Disp: 90 tablet, Rfl: 3 .  lidocaine (LIDODERM) 5 %, USE AS DIRECTED ONCE DAILY EXTERNALLY, Disp: 30 patch, Rfl: 2 .  Lidocaine 5 % CREA, , Disp: , Rfl:  .  Melatonin 3 MG TABS, , Disp: , Rfl:  .  methocarbamol (ROBAXIN) 750 MG tablet, TAKE 1 TABLET BY MOUTH THREE TIMES DAILY, Disp: 90 tablet, Rfl: 0 .  MULTIPLE VITAMIN PO, Take by mouth., Disp: , Rfl:  .  ondansetron (ZOFRAN-ODT) 4 MG disintegrating tablet, , Disp: , Rfl: 0 .  rizatriptan (MAXALT) 10 MG tablet, Take 1 tablet (10 mg total) by mouth as needed for migraine., Disp: 10 tablet, Rfl: 1 .  sildenafil (VIAGRA) 100 MG tablet, , Disp: , Rfl:  .  tacrolimus (PROTOPIC) 0.1 % ointment, APPLY TO AFFECTED AREAS UTD, Disp: , Rfl:  .  traZODone (DESYREL) 100 MG tablet, Take 1 tablet (100 mg total) by mouth at bedtime as needed for sleep., Disp: 30 tablet, Rfl: 2   Allergies  Allergen Reactions  . Bacitracin Itching and Rash  . Iodine Itching and Rash    IV Iodine   . Sulfamethoxazole-Trimethoprim Rash   Review of Systems   Pertinent items are noted in the HPI. Otherwise, ROS is negative.  Vitals:   Vitals:   09/30/18 0841  BP: 110/62  Pulse: 75  Temp: 98 F (36.7 C)  TempSrc: Oral  SpO2: 96%  Weight: 221 lb 6.4 oz (100.4 kg)  Height: '6\' 4"'  (1.93 m)     Body mass index is 26.95 kg/m.  Physical Exam:   Physical Exam  Constitutional: He is oriented to person, place, and time. He appears well-developed and well-nourished. No distress.  HENT:  Head: Normocephalic and atraumatic.  Right Ear: External ear normal.  Left Ear: External ear normal.  Nose: Nose normal.  Eyes: Conjunctivae are normal.  Neck: Normal range of motion.  Cardiovascular: Normal rate and regular rhythm.  Pulmonary/Chest: Effort normal. No respiratory distress.  Abdominal: Soft. Bowel sounds are normal. There is no tenderness.  Musculoskeletal: Normal range of motion.  He exhibits no edema or deformity.  Neurological: He is alert and oriented to person, place, and time.  Skin: Skin is warm. He is not diaphoretic.  Psychiatric: He has a normal mood and affect. His behavior is normal.   Results for orders placed or performed in visit on 05/16/18  Iron, TIBC and Ferritin Panel  Result Value Ref Range   Iron 124   CBC and differential  Result Value Ref Range   Hemoglobin 14.7 13.5 - 17.5   HCT 43 41 - 53   Platelets 220 150 - 399   WBC 4.9   Basic metabolic panel  Result Value Ref Range   Glucose 94    BUN 10 4 - 21  Creatinine 1.0 0.6 - 1.3   Potassium 3.8 3.4 - 5.3   Sodium 142 137 - 147  Lipid panel  Result Value Ref Range   Triglycerides 82 40 - 160   Cholesterol 190 0 - 200   HDL 66 35 - 70   LDL Cholesterol 107   Hepatic function panel  Result Value Ref Range   ALT 12 10 - 40   AST 13 (A) 14 - 40   Bilirubin, Total 0.5   Vitamin B12  Result Value Ref Range   Vitamin B-12 695   TSH  Result Value Ref Range   TSH 2.71 0.41 - 5.90  Hemoglobin A1c  Result Value Ref Range   Hemoglobin A1C 5.0   PSA  Result Value Ref Range   PSA 1.2   Iron, TIBC and Ferritin Panel  Result Value Ref Range   Iron 138   CBC and differential  Result Value Ref Range   Hemoglobin 14.9 13.5 - 17.5   HCT 44 41 - 53   Neutrophils Absolute 3,053    Platelets 246 150 - 399   WBC 4.9   VITAMIN D 25 Hydroxy (Vit-D Deficiency, Fractures)  Result Value Ref Range   Vit D, 25-Hydroxy 32   Basic metabolic panel  Result Value Ref Range   Glucose 102    BUN 8 4 - 21   Creatinine 1.0 0.6 - 1.3   Potassium 4.2 3.4 - 5.3   Sodium 140 137 - 147  Lipid panel  Result Value Ref Range   Triglycerides 74 40 - 160   Cholesterol 182 0 - 200   LDL Cholesterol 104   Hepatic function panel  Result Value Ref Range   Alkaline Phosphatase 56 25 - 125   ALT 13 10 - 40   AST 14 14 - 40   Bilirubin, Total 0.6   Vitamin B12  Result Value Ref Range   Vitamin B-12  416   TSH  Result Value Ref Range   TSH 1.97 0.41 - 5.90   Assessment and Plan:   Braylen was seen today for follow-up.  Diagnoses and all orders for this visit:  Chronic myofascial pain Comments: Refill Robaxin today. Red flags reviewed.  Orders: -     methocarbamol (ROBAXIN) 750 MG tablet; Take 1 tablet (750 mg total) by mouth 3 (three) times daily.  Peyronie's disease Comments: Patient plans on surgery in the future.    . Reviewed expectations re: course of current medical issues. . Discussed self-management of symptoms. . Outlined signs and symptoms indicating need for more acute intervention. . Patient verbalized understanding and all questions were answered. Marland Kitchen Health Maintenance issues including appropriate healthy diet, exercise, and smoking avoidance were discussed with patient. . See orders for this visit as documented in the electronic medical record. . Patient received an After Visit Summary.  CMA served as Education administrator during this visit. History, Physical, and Plan performed by medical provider. The above documentation has been reviewed and is accurate and complete. Briscoe Deutscher, D.O.  Briscoe Deutscher, DO , [facility] 09/30/2018

## 2018-10-01 NOTE — Therapy (Signed)
[place] MAIN [facility] SERVICES [address] [place], [place], [address] Phone: [phone]   Fax:  [phone]  Physical Therapy Treatment  Patient Details  Name: Michael Garcia MRN: [id number] Date of Birth: 1956-02-01 Referring Provider (PT): Dr. Roland Rack   Encounter Date: 09/30/2018  PT End of Session - 09/30/18 2021    Visit Number  6    Number of Visits  9    Date for PT Re-Evaluation  09/24/18    Authorization Type  progress note 4/10; eval 08/26/18    PT Start Time  1515    PT Stop Time  1600    PT Time Calculation (min)  45 min    Activity Tolerance  Patient tolerated treatment well    Behavior During Therapy  [facility] for tasks assessed/performed       Past Medical History:  Diagnosis Date  . Alcoholism ([place])   . Anemia 11/21/2017  . Anxiety   . Arthritis   . Benign non-nodular prostatic hyperplasia with lower urinary tract symptoms 11/21/2017  . Bilateral arm pain 04/29/2014  . Cervical spondylosis without myelopathy 04/29/2014  . Cervical stenosis of spinal canal 11/12/2008  . Chronic headaches   . Chronic myofascial pain 11/21/2017  . Constipation 11/21/2017  . Degenerative disc disease, cervical 04/29/2014  . Depression   . Erectile disorder, acquired, generalized, severe 11/21/2017  . GAD (generalized anxiety disorder) 03/29/2016  . Gastro-esophageal reflux disease with esophagitis 11/21/2017  . Glaucoma 11/21/2017  . Incomplete right bundle branch block 10/18/2016  . Insomnia, persistent 03/29/2016  . Lichen planus, penis 11/21/2017  . Lipid disorder 11/21/2017  . Lumbar radicular pain 11/21/2017  . Nondependent alcohol abuse, in remission 03/29/2016  . Nonintractable migraine, unspecified migraine type 11/21/2017  . Peyronie disease 11/12/2016  . Status post cervical spinal fusion 04/29/2014  . Thyroid nodule 11/21/2017    Past Surgical History:  Procedure Laterality Date  . CERVICAL SPINE SURGERY  2010   c6-c7 fusion  .  HERNIA REPAIR      There were no vitals filed for this visit.  Subjective Assessment - 09/30/18 1520    Subjective  Patient states that R shoulder is sore in the front and he continues to have some muscle cramping along the medial scapular border. He feels the manual therapy and TDN are helping. HEP compliance is not perfect, but he accomplishes it 2x/week.     Pertinent History  Patient is a 62 yo male with chief complaint of R shoulder pain. Patient reports he has had right shoulder pain for many years but does not recall any specific cause or injury. Pt does state he had a fall onto his right outstretched hand approximately 15 years ago; did break his elbow and had a minor RC tear that the doctors did not believe it was worth operating. Pt states he feels as though his shoulder pain has gotten worse in the past few years. Pt reports increased pain with sleeping, carrying heavy objects, reaching over head, and reaching behind his back. PMH significant for C6-7 ACDF for right cervical radiculopathy which he states resolved his symptoms as well as OA and GERD. CNS tumor at T11-T12, lamoplasty T12-L1 this summer.     Limitations  House hold activities    How long can you sit comfortably?  NA    How long can you stand comfortably?  NA    How long can you walk comfortably?  NA    Diagnostic tests  X-ray normal  Patient Stated Goals  "I'd love to play tennis, but that's probably not going to happen. I'd love to get it stronger and find a way to decrease referred migraines"    Currently in Pain?  Yes    Pain Score  4     Pain Location  Shoulder    Pain Orientation  Right    Pain Descriptors / Indicators  Aching    Pain Type  Chronic pain    Pain Frequency  Intermittent    Multiple Pain Sites  No      TREATMENT  Manual Therapy R shoulder PROM flexion, abduction, ER, and IR with end range holds and gentle distraction/oscillation to minimize muscle guarding; R shoulder grade II-III AP mobs at  60 degrees abduction and ER as tolerated 30s/bout x 3 bouts; R shoulder grade II-III inferior mobs at 90 degrees abduction 30s/bout x 3 bouts; R Scapular PNF D1, passive x15 reps, manual resistance x10 reps  Ther-ex  Supine R shoulder flexion with 3# dumbbell 1 x 10 w/ verbal cues to retract and depress scapula Seated R shoulder flexion with 3# dumbbell 1 x 10 w/ verbal cues to retract and depress scapula Seated R shoulder serratus punch 2 x 10 with visual feedback to ensure decreased shoulder elevation; Supine R shoulder rhythmic stabilization at 90 flexion 30s x 2; Chin tucks x10  Pt educated throughout session regarding posture and technique with exercises. Improved exercise technique, movement at target joints, use of target muscles after min verbal, visual, tactile cues. Patient educated on benefits of increasing variety of positions throughout the day in order to open the chest and increase posterior muscle activation.   PT Short Term Goals - 09/30/18 2023      PT SHORT TERM GOAL #1   Title  Patient will be independent in home exercise program to improve strength/mobility for better functional independence with ADLs.    Baseline  08/27/18: given HEP; 09/30/18: 2x/week     Time  2    Period  Weeks    Status  On-going    Target Date  10/15/18        PT Long Term Goals - 09/30/18 2023      PT LONG TERM GOAL #1   Title  Patient will increase R shoulder abduction, ER, and IR to 4+/5 as to improve functional strength for overhead ADLs and playing tennis.    Baseline  08/27/18: ABD 4/5, IR 4/5, ER 4-/5; 09/30/2018: ABD 4/5, IR 4/5, ER 4/5    Time  4    Period  Weeks    Status  On-going    Target Date  10/29/18      PT LONG TERM GOAL #2   Title  Patient will decrease Quick DASH score by 10% to indicate increased functional ability related to shoulder pain and mobility.    Baseline  08/27/18: 25%    Time  4    Period  Weeks    Status  On-going    Target Date  10/29/18       PT LONG TERM GOAL #3   Title  Pt will report no pain greater than 3/10 for improved functional ability and recreational ability to play tennis.    Baseline  08/27/18: 5/10; 09/30/18: 4/10    Time  4    Period  Weeks    Status  On-going    Target Date  10/29/18            Plan - 09/30/18  2028    Clinical Impression Statement  Patient continues to report pain (4/10) in the R shoulder but is amenable to therapy. Patient reports benefit from manual interventions and demonstrates improved AROM after manual therapy. Patient continues to have increased pain with gravity eliminated abduction from ~80-110 degrees; however, patient has decreased pain with aBduction when performing scapular depression and retraction prior to initiating movement. Patient will continue to benefit from skilled therapeutic intervention to address deficits in strength, pain, and ROM in order to improve overall QOL.    Rehab Potential  Fair    Clinical Impairments Affecting Rehab Potential  (+) age, motivated, active, no structural damage (-) chronicity of problem, migraines    PT Frequency  2x / week    PT Duration  4 weeks    PT Treatment/Interventions  Cryotherapy;Electrical Stimulation;Moist Heat;Ultrasound;Therapeutic activities;Therapeutic exercise;Patient/family education;Manual techniques;Dry needling    PT Next Visit Plan  GH mobilizations, soft tissue work, repeat TDN if helpful, TNE, scapular PNF    PT Home Exercise Plan  cervical retractions, scapular retractions    Consulted and Agree with Plan of Care  Patient       Patient will benefit from skilled therapeutic intervention in order to improve the following deficits and impairments:  Decreased strength, Hypomobility, Increased muscle spasms, Pain, Postural dysfunction  Visit Diagnosis: Stiffness of right shoulder, not elsewhere classified  Chronic right shoulder pain  Muscle weakness (generalized)     Problem List Patient Active Problem List    Diagnosis Date Noted  . Intradural mass ([place]) 04/22/2018  . Anemia 11/21/2017  . Benign non-nodular prostatic hyperplasia with lower urinary tract symptoms 11/21/2017  . Erectile dysfunction due to arterial insufficiency 11/21/2017  . Gastro-esophageal reflux disease with esophagitis 11/21/2017  . Lichen planus 21/22/4825  . Lipid disorder 11/21/2017  . Lumbar radicular pain 11/21/2017  . Migraine headache 11/21/2017  . Thyroid nodule 11/21/2017  . Glaucoma 11/21/2017  . Chronic myofascial pain 11/21/2017  . Peyronie's disease 11/12/2016  . Incomplete right bundle branch block 10/18/2016  . GAD (generalized anxiety disorder) 03/29/2016  . Insomnia, persistent 03/29/2016  . MDD (major depressive disorder), recurrent episode, moderate ([place]) 03/29/2016  . Nondependent alcohol abuse, in remission 03/29/2016  . Cervical spondylosis without myelopathy 04/29/2014  . Degenerative disc disease, cervical 04/29/2014  . Status post cervical spinal fusion 04/29/2014  . Cervical stenosis of spinal canal 11/12/2008   Myles Gip PT, DPT [phone] 10/01/2018, 8:33 PM  [place] MAIN [facility] SERVICES [address] [place], [place], [address] Phone: [phone]   Fax:  [phone]  Name: Fareed Fung MRN: [id number] Date of Birth: February 04, 1956

## 2018-10-02 ENCOUNTER — Ambulatory Visit: Payer: 59 | Admitting: Physical Therapy

## 2018-10-02 DIAGNOSIS — M25511 Pain in right shoulder: Secondary | ICD-10-CM | POA: Diagnosis not present

## 2018-10-02 DIAGNOSIS — M25611 Stiffness of right shoulder, not elsewhere classified: Secondary | ICD-10-CM | POA: Diagnosis not present

## 2018-10-02 DIAGNOSIS — G8929 Other chronic pain: Secondary | ICD-10-CM | POA: Diagnosis not present

## 2018-10-02 DIAGNOSIS — M6281 Muscle weakness (generalized): Secondary | ICD-10-CM | POA: Diagnosis not present

## 2018-10-02 NOTE — Therapy (Signed)
[place] MAIN [facility] SERVICES [address] [place], [place], [address] Phone: [phone]   Fax:  [phone]  Physical Therapy Treatment  Patient Details  Name: Michael Garcia MRN: [id number] Date of Birth: October 03, 1956 Referring Provider (PT): Dr. Roland Rack   Encounter Date: 10/02/2018  PT End of Session - 10/02/18 1502    Visit Number  7    Number of Visits  14    Date for PT Re-Evaluation  10/29/18    Authorization Type  progress note 4/10; eval 08/26/18    PT Start Time  1510    PT Stop Time  1555    PT Time Calculation (min)  45 min    Activity Tolerance  Patient tolerated treatment well    Behavior During Therapy  [facility] for tasks assessed/performed       Past Medical History:  Diagnosis Date  . Alcoholism ([place])   . Anemia 11/21/2017  . Anxiety   . Arthritis   . Benign non-nodular prostatic hyperplasia with lower urinary tract symptoms 11/21/2017  . Bilateral arm pain 04/29/2014  . Cervical spondylosis without myelopathy 04/29/2014  . Cervical stenosis of spinal canal 11/12/2008  . Chronic headaches   . Chronic myofascial pain 11/21/2017  . Constipation 11/21/2017  . Degenerative disc disease, cervical 04/29/2014  . Depression   . Erectile disorder, acquired, generalized, severe 11/21/2017  . GAD (generalized anxiety disorder) 03/29/2016  . Gastro-esophageal reflux disease with esophagitis 11/21/2017  . Glaucoma 11/21/2017  . Incomplete right bundle branch block 10/18/2016  . Insomnia, persistent 03/29/2016  . Lichen planus, penis 11/21/2017  . Lipid disorder 11/21/2017  . Lumbar radicular pain 11/21/2017  . Nondependent alcohol abuse, in remission 03/29/2016  . Nonintractable migraine, unspecified migraine type 11/21/2017  . Peyronie disease 11/12/2016  . Status post cervical spinal fusion 04/29/2014  . Thyroid nodule 11/21/2017    Past Surgical History:  Procedure Laterality Date  . CERVICAL SPINE SURGERY  2010   c6-c7 fusion  .  HERNIA REPAIR      There were no vitals filed for this visit.  Subjective Assessment - 10/02/18 1511    Subjective  Patient states that R shoulder is still sore 4/10. Patient notes no significant changes since the last session. Patient has no questions or concerns at this time.     Pertinent History  Patient is a 62 yo male with chief complaint of R shoulder pain. Patient reports he has had right shoulder pain for many years but does not recall any specific cause or injury. Pt does state he had a fall onto his right outstretched hand approximately 15 years ago; did break his elbow and had a minor RC tear that the doctors did not believe it was worth operating. Pt states he feels as though his shoulder pain has gotten worse in the past few years. Pt reports increased pain with sleeping, carrying heavy objects, reaching over head, and reaching behind his back. PMH significant for C6-7 ACDF for right cervical radiculopathy which he states resolved his symptoms as well as OA and GERD. CNS tumor at T11-T12, lamoplasty T12-L1 this summer.     Limitations  House hold activities    How long can you sit comfortably?  NA    How long can you stand comfortably?  NA    How long can you walk comfortably?  NA    Diagnostic tests  X-ray normal    Patient Stated Goals  "I'd love to play tennis, but that's probably  not going to happen. I'd love to get it stronger and find a way to decrease referred migraines"    Currently in Pain?  Yes    Pain Score  4     Pain Location  Shoulder    Pain Orientation  Right    Pain Descriptors / Indicators  Aching    Pain Type  Chronic pain    Pain Onset  More than a month ago    Pain Frequency  Intermittent       Manual Therapy R Scapular PNF D1, passive 2x10 reps, manual resistance on retraction and depression x15 reps. Noted lack of coordination at the scapula with end reps achieving greater fluidity.  Neuromuscular Re-education Postural re-education in sitting with  mirror feedback 5 x 66min Postural re-education in standing with verbal feedback x 5 min w/ R UE AROM Seated Scapular retraction with ER 10 x 5 sec hold with tactile feedback for scapular position CKC (cat/cow) scapular pro/retraction on table 5 x 5 sec hold retraction CKC wall push-up prep (scapular pro/retraction) 10 x 5 sec hold  Patient educated on strategies to incorporate more body awareness and postural awareness during the day. Patient educated on benefits of creating more contact points with his body and his standing/sitting surface in order to assist in building body awareness. Patient articulated understanding and returned demonstration.     PT Short Term Goals - 09/30/18 2023      PT SHORT TERM GOAL #1   Title  Patient will be independent in home exercise program to improve strength/mobility for better functional independence with ADLs.    Baseline  08/27/18: given HEP; 09/30/18: 2x/week     Time  2    Period  Weeks    Status  On-going    Target Date  10/15/18        PT Long Term Goals - 09/30/18 2023      PT LONG TERM GOAL #1   Title  Patient will increase R shoulder abduction, ER, and IR to 4+/5 as to improve functional strength for overhead ADLs and playing tennis.    Baseline  08/27/18: ABD 4/5, IR 4/5, ER 4-/5; 09/30/2018: ABD 4/5, IR 4/5, ER 4/5    Time  4    Period  Weeks    Status  On-going    Target Date  10/29/18      PT LONG TERM GOAL #2   Title  Patient will decrease Quick DASH score by 10% to indicate increased functional ability related to shoulder pain and mobility.    Baseline  08/27/18: 25%    Time  4    Period  Weeks    Status  On-going    Target Date  10/29/18      PT LONG TERM GOAL #3   Title  Pt will report no pain greater than 3/10 for improved functional ability and recreational ability to play tennis.    Baseline  08/27/18: 5/10; 09/30/18: 4/10    Time  4    Period  Weeks    Status  On-going    Target Date  10/29/18             Plan - 10/02/18 1648    Clinical Impression Statement  Patient presents to clinic with increased soreness from last session, but is amenable to therapy. Patient received postural re-education in sitting and standing postures with visual cues and demonstrates increased fatigue in both positions when trying to maintain depressed shoulders and scapula  retracted to a neutral position. Patient benefits from scpaular PNF with manual cues as well as CKC scapular positioning. At the end of the session, the patient reported reduced "pinching" sensation at the subacromial region and decreased muscle soreness at supra- and infraspinatus regions. Patient was encouraged to continue performing scapular positioning and postural awareness activities throughout his day as he thinks of it. Patient will continue to benefit from skilled therapeutic intervention to address deficits in strength, pain, and posture in order to return to PLOF.    Rehab Potential  Fair    Clinical Impairments Affecting Rehab Potential  (+) age, motivated, active, no structural damage (-) chronicity of problem, migraines    PT Frequency  2x / week    PT Duration  4 weeks    PT Treatment/Interventions  Cryotherapy;Electrical Stimulation;Moist Heat;Ultrasound;Therapeutic activities;Therapeutic exercise;Patient/family education;Manual techniques;Dry needling    PT Next Visit Plan  GH mobilizations, soft tissue work, repeat TDN if helpful, TNE, scapular PNF    PT Home Exercise Plan  cervical retractions, scapular retractions    Consulted and Agree with Plan of Care  Patient       Patient will benefit from skilled therapeutic intervention in order to improve the following deficits and impairments:  Decreased strength, Hypomobility, Increased muscle spasms, Pain, Postural dysfunction  Visit Diagnosis: Stiffness of right shoulder, not elsewhere classified  Chronic right shoulder pain  Muscle weakness (generalized)     Problem  List Patient Active Problem List   Diagnosis Date Noted  . Intradural mass ([place]) 04/22/2018  . Anemia 11/21/2017  . Benign non-nodular prostatic hyperplasia with lower urinary tract symptoms 11/21/2017  . Erectile dysfunction due to arterial insufficiency 11/21/2017  . Gastro-esophageal reflux disease with esophagitis 11/21/2017  . Lichen planus 09/40/7680  . Lipid disorder 11/21/2017  . Lumbar radicular pain 11/21/2017  . Migraine headache 11/21/2017  . Thyroid nodule 11/21/2017  . Glaucoma 11/21/2017  . Chronic myofascial pain 11/21/2017  . Peyronie's disease 11/12/2016  . Incomplete right bundle branch block 10/18/2016  . GAD (generalized anxiety disorder) 03/29/2016  . Insomnia, persistent 03/29/2016  . MDD (major depressive disorder), recurrent episode, moderate ([place]) 03/29/2016  . Nondependent alcohol abuse, in remission 03/29/2016  . Cervical spondylosis without myelopathy 04/29/2014  . Degenerative disc disease, cervical 04/29/2014  . Status post cervical spinal fusion 04/29/2014  . Cervical stenosis of spinal canal 11/12/2008   Myles Gip PT, DPT [phone] 10/02/2018, 4:59 PM  [place] MAIN [facility] SERVICES [address] [place], [place], [address] Phone: [phone]   Fax:  [phone]  Name: Michael Garcia MRN: [id number] Date of Birth: [birth year]

## 2018-10-07 ENCOUNTER — Ambulatory Visit: Payer: 59

## 2018-10-07 DIAGNOSIS — M25611 Stiffness of right shoulder, not elsewhere classified: Secondary | ICD-10-CM | POA: Diagnosis not present

## 2018-10-07 DIAGNOSIS — G8929 Other chronic pain: Secondary | ICD-10-CM

## 2018-10-07 DIAGNOSIS — M6281 Muscle weakness (generalized): Secondary | ICD-10-CM | POA: Diagnosis not present

## 2018-10-07 DIAGNOSIS — M25511 Pain in right shoulder: Secondary | ICD-10-CM

## 2018-10-07 NOTE — Therapy (Signed)
[place] MAIN [facility] SERVICES [address] [place], [place], [address] Phone: [phone]   Fax:  [phone]  Physical Therapy Treatment  Patient Details  Name: Michael Garcia MRN: [id number] Date of Birth: 01-10-1956 Referring Provider (PT): Dr. Roland Rack   Encounter Date: 10/07/2018  PT End of Session - 10/07/18 1522    Visit Number  8    Number of Visits  14    Date for PT Re-Evaluation  10/29/18    Authorization Type  progress note 5/10; eval 08/26/18    PT Start Time  1516    PT Stop Time  1600    PT Time Calculation (min)  44 min    Activity Tolerance  Patient tolerated treatment well    Behavior During Therapy  [facility] for tasks assessed/performed       Past Medical History:  Diagnosis Date  . Alcoholism ([place])   . Anemia 11/21/2017  . Anxiety   . Arthritis   . Benign non-nodular prostatic hyperplasia with lower urinary tract symptoms 11/21/2017  . Bilateral arm pain 04/29/2014  . Cervical spondylosis without myelopathy 04/29/2014  . Cervical stenosis of spinal canal 11/12/2008  . Chronic headaches   . Chronic myofascial pain 11/21/2017  . Constipation 11/21/2017  . Degenerative disc disease, cervical 04/29/2014  . Depression   . Erectile disorder, acquired, generalized, severe 11/21/2017  . GAD (generalized anxiety disorder) 03/29/2016  . Gastro-esophageal reflux disease with esophagitis 11/21/2017  . Glaucoma 11/21/2017  . Incomplete right bundle branch block 10/18/2016  . Insomnia, persistent 03/29/2016  . Lichen planus, penis 11/21/2017  . Lipid disorder 11/21/2017  . Lumbar radicular pain 11/21/2017  . Nondependent alcohol abuse, in remission 03/29/2016  . Nonintractable migraine, unspecified migraine type 11/21/2017  . Peyronie disease 11/12/2016  . Status post cervical spinal fusion 04/29/2014  . Thyroid nodule 11/21/2017    Past Surgical History:  Procedure Laterality Date  . CERVICAL SPINE SURGERY  2010   c6-c7 fusion  .  HERNIA REPAIR      There were no vitals filed for this visit.  Subjective Assessment - 10/07/18 1515    Subjective  Patient states that R shoulder is still sore at a 2/10. He believes that physical therapy has been helpful. Patient has no questions or concerns at this time.     Pertinent History  Patient is a 62 yo male with chief complaint of R shoulder pain. Patient reports he has had right shoulder pain for many years but does not recall any specific cause or injury. Pt does state he had a fall onto his right outstretched hand approximately 15 years ago; did break his elbow and had a minor RC tear that the doctors did not believe it was worth operating. Pt states he feels as though his shoulder pain has gotten worse in the past few years. Pt reports increased pain with sleeping, carrying heavy objects, reaching over head, and reaching behind his back. PMH significant for C6-7 ACDF for right cervical radiculopathy which he states resolved his symptoms as well as OA and GERD. CNS tumor at T11-T12, lamoplasty T12-L1 this summer.     Limitations  House hold activities    How long can you sit comfortably?  NA    How long can you stand comfortably?  NA    How long can you walk comfortably?  NA    Diagnostic tests  X-ray normal    Patient Stated Goals  "I'd love to play tennis, but that's  probably not going to happen. I'd love to get it stronger and find a way to decrease referred migraines"    Currently in Pain?  Yes    Pain Score  2     Pain Location  Shoulder    Pain Orientation  Right    Pain Descriptors / Indicators  Aching    Pain Type  Chronic pain    Pain Onset  More than a month ago        TREATMENT   Manual Therapy Therapeutic neuroscience education performed with patient today regarding chronic pain treatment techniques, laterality training, visualization, mirror training, and graded exercise. Pt states that he has been performing laterality training. Will need to discuss and  implement further TNE such as visualization techniques and mirror therapy in future sessions.  L sidelying R shoulder mobilizations with focus on general relaxation as well as upward rotation; R shouldergrade II-III AP mobilizations at neutral, 30s/bout x 2 bouts; R shouldergrade II-III AP/inferior mobilizations at available end range flexion 30s/bout x 2 bouts; R shoulder grade II-III inferior mobilizations at 90 abduction, 30s/bout x 2 bouts, no pain; R shouldergrade II-III medial to lateral mobilizations at 90 flexion, 30s/bout x 2 bouts; R shoulder MWM with inferior glide into flexion x 5 and then into abduction x 5, no pain during and notable reduction in pain following;   Trigger Point Dry Needling (TDN), unbilled Education performed with patient regarding potential benefit of TDN. Reviewed precautions and risks with patient. Pt provided verbal consent to treatment. TDN performed to with2,0.30x 50single needle placements to R upper trapwith local twitch response (LTR) during both placement.Also performed 2 placements in R lateral deltoid.Pistoning technique utilized for all placements. Improved pain-free motion following intervention.   Ther-ex  Supine R shoulder serratus punch with gentle manual resistance x 10;   Pt educated throughout session about proper posture and technique with exercises. Improved exercise technique, movement at target joints, use of target muscles after min to mod verbal, visual, tactile cues.   Pt continues to experience decreased pain with R shoulderAROM following TDN and manual techniques.Therapeutic neuroscience education performed with patient today regarding chronic pain treatment techniques, laterality training, visualization, mirror training, and graded exercise again. Will need to discuss and implement further TNE such as visualization techniques and mirror therapy in future sessions. Willcontinue toassesresponse to TDN at next session  to determine effectiveness. Will continue to progress to active strengthening.Pt will benefit from PT services to address deficits in strengthand painin order to return to full function at homeand work with less pain.                    PT Short Term Goals - 09/30/18 2023      PT SHORT TERM GOAL #1   Title  Patient will be independent in home exercise program to improve strength/mobility for better functional independence with ADLs.    Baseline  08/27/18: given HEP; 09/30/18: 2x/week     Time  2    Period  Weeks    Status  On-going    Target Date  10/15/18        PT Long Term Goals - 09/30/18 2023      PT LONG TERM GOAL #1   Title  Patient will increase R shoulder abduction, ER, and IR to 4+/5 as to improve functional strength for overhead ADLs and playing tennis.    Baseline  08/27/18: ABD 4/5, IR 4/5, ER 4-/5; 09/30/2018: ABD 4/5, IR 4/5, ER 4/5  Time  4    Period  Weeks    Status  On-going    Target Date  10/29/18      PT LONG TERM GOAL #2   Title  Patient will decrease Quick DASH score by 10% to indicate increased functional ability related to shoulder pain and mobility.    Baseline  08/27/18: 25%    Time  4    Period  Weeks    Status  On-going    Target Date  10/29/18      PT LONG TERM GOAL #3   Title  Pt will report no pain greater than 3/10 for improved functional ability and recreational ability to play tennis.    Baseline  08/27/18: 5/10; 09/30/18: 4/10    Time  4    Period  Weeks    Status  On-going    Target Date  10/29/18            Plan - 10/07/18 1522    Clinical Impression Statement  Pt continues to experience decreased pain with R shoulderAROM following TDN and manual techniques.Therapeutic neuroscience education performed with patient today regarding chronic pain treatment techniques, laterality training, visualization, mirror training, and graded exercise again. Will need to discuss and implement further TNE such as  visualization techniques and mirror therapy in future sessions. Willcontinue toassesresponse to TDN at next session to determine effectiveness. Will continue to progress to active strengthening.Pt will benefit from PT services to address deficits in strengthand painin order to return to full function at homeand work with less pain.    Rehab Potential  Fair    Clinical Impairments Affecting Rehab Potential  (+) age, motivated, active, no structural damage (-) chronicity of problem, migraines    PT Frequency  2x / week    PT Duration  4 weeks    PT Treatment/Interventions  Cryotherapy;Electrical Stimulation;Moist Heat;Ultrasound;Therapeutic activities;Therapeutic exercise;Patient/family education;Manual techniques;Dry needling    PT Next Visit Plan  GH mobilizations, soft tissue work, repeat TDN if helpful, TNE, scapular PNF    PT Home Exercise Plan  cervical retractions, scapular retractions    Consulted and Agree with Plan of Care  Patient       Patient will benefit from skilled therapeutic intervention in order to improve the following deficits and impairments:  Decreased strength, Hypomobility, Increased muscle spasms, Pain, Postural dysfunction  Visit Diagnosis: Stiffness of right shoulder, not elsewhere classified  Chronic right shoulder pain  Muscle weakness (generalized)     Problem List Patient Active Problem List   Diagnosis Date Noted  . Intradural mass ([place]) 04/22/2018  . Anemia 11/21/2017  . Benign non-nodular prostatic hyperplasia with lower urinary tract symptoms 11/21/2017  . Erectile dysfunction due to arterial insufficiency 11/21/2017  . Gastro-esophageal reflux disease with esophagitis 11/21/2017  . Lichen planus 65/99/3570  . Lipid disorder 11/21/2017  . Lumbar radicular pain 11/21/2017  . Migraine headache 11/21/2017  . Thyroid nodule 11/21/2017  . Glaucoma 11/21/2017  . Chronic myofascial pain 11/21/2017  . Peyronie's disease 11/12/2016  .  Incomplete right bundle branch block 10/18/2016  . GAD (generalized anxiety disorder) 03/29/2016  . Insomnia, persistent 03/29/2016  . MDD (major depressive disorder), recurrent episode, moderate ([place]) 03/29/2016  . Nondependent alcohol abuse, in remission 03/29/2016  . Cervical spondylosis without myelopathy 04/29/2014  . Degenerative disc disease, cervical 04/29/2014  . Status post cervical spinal fusion 04/29/2014  . Cervical stenosis of spinal canal 11/12/2008   Lyndel Safe Deigo Alonso PT, DPT, GCS  Leib Elahi 10/07/2018, 6:31 PM  [name] MAIN [facility] SERVICES [address] [place], [place], [address] Phone: [phone]   Fax:  [phone]  Name: Jerard Bays MRN: [id number] Date of Birth: 1956/11/01

## 2018-10-13 ENCOUNTER — Ambulatory Visit: Payer: 59 | Attending: Surgery

## 2018-10-13 DIAGNOSIS — M25511 Pain in right shoulder: Secondary | ICD-10-CM | POA: Diagnosis not present

## 2018-10-13 DIAGNOSIS — M6281 Muscle weakness (generalized): Secondary | ICD-10-CM | POA: Insufficient documentation

## 2018-10-13 DIAGNOSIS — G8929 Other chronic pain: Secondary | ICD-10-CM | POA: Insufficient documentation

## 2018-10-13 DIAGNOSIS — M25611 Stiffness of right shoulder, not elsewhere classified: Secondary | ICD-10-CM | POA: Insufficient documentation

## 2018-10-13 NOTE — Therapy (Signed)
[place] MAIN [facility] SERVICES [address] [place], [place], [address] Phone: [phone]   Fax:  [phone]  Physical Therapy Treatment  Patient Details  Name: Khallid Pasillas MRN: [id number] Date of Birth: December 07, 1955 Referring Provider (PT): Dr. Roland Rack   Encounter Date: 10/13/2018  PT End of Session - 10/15/18 1247    Visit Number  9    Number of Visits  14    Date for PT Re-Evaluation  10/29/18    Authorization Type  progress note 6/10; eval 08/26/18    PT Start Time  6226    PT Stop Time  1600    PT Time Calculation (min)  44 min    Activity Tolerance  Patient tolerated treatment well    Behavior During Therapy  [facility] for tasks assessed/performed       Past Medical History:  Diagnosis Date  . Alcoholism ([place])   . Anemia 11/21/2017  . Anxiety   . Arthritis   . Benign non-nodular prostatic hyperplasia with lower urinary tract symptoms 11/21/2017  . Bilateral arm pain 04/29/2014  . Cervical spondylosis without myelopathy 04/29/2014  . Cervical stenosis of spinal canal 11/12/2008  . Chronic headaches   . Chronic myofascial pain 11/21/2017  . Constipation 11/21/2017  . Degenerative disc disease, cervical 04/29/2014  . Depression   . Erectile disorder, acquired, generalized, severe 11/21/2017  . GAD (generalized anxiety disorder) 03/29/2016  . Gastro-esophageal reflux disease with esophagitis 11/21/2017  . Glaucoma 11/21/2017  . Incomplete right bundle branch block 10/18/2016  . Insomnia, persistent 03/29/2016  . Lichen planus, penis 11/21/2017  . Lipid disorder 11/21/2017  . Lumbar radicular pain 11/21/2017  . Nondependent alcohol abuse, in remission 03/29/2016  . Nonintractable migraine, unspecified migraine type 11/21/2017  . Peyronie disease 11/12/2016  . Status post cervical spinal fusion 04/29/2014  . Thyroid nodule 11/21/2017    Past Surgical History:  Procedure Laterality Date  . CERVICAL SPINE SURGERY  2010   c6-c7 fusion  .  HERNIA REPAIR      There were no vitals filed for this visit.  Subjective Assessment - 10/15/18 1247    Subjective  No pain at rest upon arrival. He believes that physical therapy has been helpful. Patient has no questions or concerns at this time. Has follow-up appointment with Dr. Roland Rack on Friday and believes that he will be done with physical therapy.     Pertinent History  Patient is a 62 yo male with chief complaint of R shoulder pain. Patient reports he has had right shoulder pain for many years but does not recall any specific cause or injury. Pt does state he had a fall onto his right outstretched hand approximately 15 years ago; did break his elbow and had a minor RC tear that the doctors did not believe it was worth operating. Pt states he feels as though his shoulder pain has gotten worse in the past few years. Pt reports increased pain with sleeping, carrying heavy objects, reaching over head, and reaching behind his back. PMH significant for C6-7 ACDF for right cervical radiculopathy which he states resolved his symptoms as well as OA and GERD. CNS tumor at T11-T12, lamoplasty T12-L1 this summer.     Limitations  House hold activities    How long can you sit comfortably?  NA    How long can you stand comfortably?  NA    How long can you walk comfortably?  NA    Diagnostic tests  X-ray normal  Patient Stated Goals  "I'd love to play tennis, but that's probably not going to happen. I'd love to get it stronger and find a way to decrease referred migraines"    Currently in Pain?  No/denies          TREATMENT   Manual Therapy R shouldergrade II-III AP/inferior mobilizations at available end range flexion 30s/bout x 2 bouts; R shoulder grade II-III inferior mobilizations at available end range abduction, 30s/bout x 2 bouts, mild pain; R shouldergrade II-III medial to lateral mobilizations at 90 flexion, 30s/bout x 2 bouts; T3-T7 CPA grade II-III, 30s/bout x 2 bouts; Active  scaption with upward rotation mobilization x 10, decrease in pain noted;   Ther-ex  Prone low rows 2# 2 x 10, cues for scap retraction; Prone "T'"s 2# 2 x 10; Prone "Y"s 2 x 10; Standing "W"s 2 x 10 with cues for scap retraction; Push-up plus on inclined mat table 2 x 10;   Pt educated throughout session about proper posture and technique with exercises. Improved exercise technique, movement at target joints, use of target muscles after min to mod verbal, visual, tactile cues.    Will continue to progress to active strengthening/motor control as this appears to work better for long term gains for patient compared to passive modalities/manual therapy. Pt making progress with therapy and notices marked decrease in pain with scapular assist overhead adding weight to the diagnosis of SAIS. Pt states that he will likely discharge at the next visit as his insurance costs will start over at the beginning of the year. He sess Dr. Roland Rack on Friday. He reports at least 25% improvement in shoulder pain since starting therapy. It would be ideal to continue therapy to see if additional gains can be achieved however pt reports feeling ready to end his therapy sessions.Pt will benefit from PT services to address deficits in strengthand painin order to return to full function at homeand work with less pain.                        PT Short Term Goals - 09/30/18 2023      PT SHORT TERM GOAL #1   Title  Patient will be independent in home exercise program to improve strength/mobility for better functional independence with ADLs.    Baseline  08/27/18: given HEP; 09/30/18: 2x/week     Time  2    Period  Weeks    Status  On-going    Target Date  10/15/18        PT Long Term Goals - 09/30/18 2023      PT LONG TERM GOAL #1   Title  Patient will increase R shoulder abduction, ER, and IR to 4+/5 as to improve functional strength for overhead ADLs and playing tennis.     Baseline  08/27/18: ABD 4/5, IR 4/5, ER 4-/5; 09/30/2018: ABD 4/5, IR 4/5, ER 4/5    Time  4    Period  Weeks    Status  On-going    Target Date  10/29/18      PT LONG TERM GOAL #2   Title  Patient will decrease Quick DASH score by 10% to indicate increased functional ability related to shoulder pain and mobility.    Baseline  08/27/18: 25%    Time  4    Period  Weeks    Status  On-going    Target Date  10/29/18      PT LONG TERM  GOAL #3   Title  Pt will report no pain greater than 3/10 for improved functional ability and recreational ability to play tennis.    Baseline  08/27/18: 5/10; 09/30/18: 4/10    Time  4    Period  Weeks    Status  On-going    Target Date  10/29/18              Patient will benefit from skilled therapeutic intervention in order to improve the following deficits and impairments:  Decreased strength, Hypomobility, Increased muscle spasms, Pain, Postural dysfunction  Visit Diagnosis: Stiffness of right shoulder, not elsewhere classified  Chronic right shoulder pain  Muscle weakness (generalized)     Problem List Patient Active Problem List   Diagnosis Date Noted  . Intradural mass ([place]) 04/22/2018  . Anemia 11/21/2017  . Benign non-nodular prostatic hyperplasia with lower urinary tract symptoms 11/21/2017  . Erectile dysfunction due to arterial insufficiency 11/21/2017  . Gastro-esophageal reflux disease with esophagitis 11/21/2017  . Lichen planus 76/19/5093  . Lipid disorder 11/21/2017  . Lumbar radicular pain 11/21/2017  . Migraine headache 11/21/2017  . Thyroid nodule 11/21/2017  . Glaucoma 11/21/2017  . Chronic myofascial pain 11/21/2017  . Peyronie's disease 11/12/2016  . Incomplete right bundle branch block 10/18/2016  . GAD (generalized anxiety disorder) 03/29/2016  . Insomnia, persistent 03/29/2016  . MDD (major depressive disorder), recurrent episode, moderate ([place]) 03/29/2016  . Nondependent alcohol abuse, in remission  03/29/2016  . Cervical spondylosis without myelopathy 04/29/2014  . Degenerative disc disease, cervical 04/29/2014  . Status post cervical spinal fusion 04/29/2014  . Cervical stenosis of spinal canal 11/12/2008   Phillips Grout PT, DPT, GCS  Kong Packett 10/15/2018, 12:50 PM  [place] MAIN [facility] SERVICES [address] [place], [place], [address] Phone: [phone]   Fax:  [phone]  Name: Nilay Mangrum MRN: [id number] Date of Birth: Jan 06, 1956

## 2018-10-14 ENCOUNTER — Ambulatory Visit: Payer: 59 | Admitting: Psychology

## 2018-10-14 DIAGNOSIS — R21 Rash and other nonspecific skin eruption: Secondary | ICD-10-CM | POA: Diagnosis not present

## 2018-10-14 DIAGNOSIS — F332 Major depressive disorder, recurrent severe without psychotic features: Secondary | ICD-10-CM | POA: Diagnosis not present

## 2018-10-16 ENCOUNTER — Ambulatory Visit: Payer: 59 | Admitting: Physical Therapy

## 2018-10-16 ENCOUNTER — Encounter: Payer: Self-pay | Admitting: Family Medicine

## 2018-10-16 DIAGNOSIS — M6281 Muscle weakness (generalized): Secondary | ICD-10-CM

## 2018-10-16 DIAGNOSIS — F331 Major depressive disorder, recurrent, moderate: Secondary | ICD-10-CM | POA: Diagnosis not present

## 2018-10-16 DIAGNOSIS — M25611 Stiffness of right shoulder, not elsewhere classified: Secondary | ICD-10-CM | POA: Diagnosis not present

## 2018-10-16 DIAGNOSIS — G8929 Other chronic pain: Secondary | ICD-10-CM | POA: Diagnosis not present

## 2018-10-16 DIAGNOSIS — F411 Generalized anxiety disorder: Secondary | ICD-10-CM | POA: Diagnosis not present

## 2018-10-16 DIAGNOSIS — M25511 Pain in right shoulder: Secondary | ICD-10-CM

## 2018-10-16 DIAGNOSIS — F5105 Insomnia due to other mental disorder: Secondary | ICD-10-CM | POA: Diagnosis not present

## 2018-10-16 NOTE — Patient Instructions (Signed)
Access Code: [email or web address]  URL: [email or web address]  Date: 10/16/2018  Prepared by: Myles Gip   Exercises  Wall Push Up with Plus  Prone Shoulder Extension - Single Arm with Dumbbell  Prone Single Arm Shoulder Y with Dumbbell  Prone Scapular Retraction in Abduction  Prone Shoulder Horizontal Abduction with Thumbs Up

## 2018-10-17 ENCOUNTER — Other Ambulatory Visit: Payer: Self-pay

## 2018-10-17 DIAGNOSIS — M7581 Other shoulder lesions, right shoulder: Secondary | ICD-10-CM | POA: Diagnosis not present

## 2018-10-17 MED ORDER — GALCANEZUMAB-GNLM 120 MG/ML ~~LOC~~ SOAJ: 120.0000 mg | SUBCUTANEOUS | 3 refills | Status: DC | PRN

## 2018-10-17 NOTE — Therapy (Addendum)
[place] MAIN [facility] SERVICES [address] [place], [place], [address] Phone: [phone]   Fax:  [phone]  Physical Therapy Treatment/Discharge Summary  Patient Details  Name: Michael Garcia MRN: [id number] Date of Birth: 03-22-1956 Referring Provider (PT): Dr. Roland Rack   Encounter Date: 10/16/2018  PT End of Session - 10/16/18 1526    Visit Number  10    Number of Visits  14    Date for PT Re-Evaluation  10/29/18    Authorization Type  progress note 6/10; eval 08/26/18    PT Start Time  1520    PT Stop Time  1600    PT Time Calculation (min)  40 min    Activity Tolerance  Patient tolerated treatment well    Behavior During Therapy  [facility] for tasks assessed/performed       Past Medical History:  Diagnosis Date  . Alcoholism ([place])   . Anemia 11/21/2017  . Anxiety   . Arthritis   . Benign non-nodular prostatic hyperplasia with lower urinary tract symptoms 11/21/2017  . Bilateral arm pain 04/29/2014  . Cervical spondylosis without myelopathy 04/29/2014  . Cervical stenosis of spinal canal 11/12/2008  . Chronic headaches   . Chronic myofascial pain 11/21/2017  . Constipation 11/21/2017  . Degenerative disc disease, cervical 04/29/2014  . Depression   . Erectile disorder, acquired, generalized, severe 11/21/2017  . GAD (generalized anxiety disorder) 03/29/2016  . Gastro-esophageal reflux disease with esophagitis 11/21/2017  . Glaucoma 11/21/2017  . Incomplete right bundle branch block 10/18/2016  . Insomnia, persistent 03/29/2016  . Lichen planus, penis 11/21/2017  . Lipid disorder 11/21/2017  . Lumbar radicular pain 11/21/2017  . Nondependent alcohol abuse, in remission 03/29/2016  . Nonintractable migraine, unspecified migraine type 11/21/2017  . Peyronie disease 11/12/2016  . Status post cervical spinal fusion 04/29/2014  . Thyroid nodule 11/21/2017    Past Surgical History:  Procedure Laterality Date  . CERVICAL SPINE SURGERY  2010    c6-c7 fusion  . HERNIA REPAIR      There were no vitals filed for this visit.  Subjective Assessment - 10/16/18 1521    Subjective  Patient states that he feels his overall motion has improved even though he still has soreness. Patient feels he understands the necessary exercises to self-manage at this time.    Pertinent History  Patient is a 62 yo male with chief complaint of R shoulder pain. Patient reports he has had right shoulder pain for many years but does not recall any specific cause or injury. Pt does state he had a fall onto his right outstretched hand approximately 15 years ago; did break his elbow and had a minor RC tear that the doctors did not believe it was worth operating. Pt states he feels as though his shoulder pain has gotten worse in the past few years. Pt reports increased pain with sleeping, carrying heavy objects, reaching over head, and reaching behind his back. PMH significant for C6-7 ACDF for right cervical radiculopathy which he states resolved his symptoms as well as OA and GERD. CNS tumor at T11-T12, lamoplasty T12-L1 this summer.     Limitations  House hold activities    How long can you sit comfortably?  NA    How long can you stand comfortably?  NA    How long can you walk comfortably?  NA    Diagnostic tests  X-ray normal    Patient Stated Goals  "I'd love to play tennis, but that's  probably not going to happen. I'd love to get it stronger and find a way to decrease referred migraines"    Currently in Pain?  Yes    Pain Score  2     Pain Location  Shoulder    Pain Orientation  Right    Pain Descriptors / Indicators  Sore    Pain Type  Chronic pain    Pain Onset  More than a month ago    Pain Frequency  Intermittent    Multiple Pain Sites  No       TREATMENT  Therapeutic Exercise: Goals reassessment and education on self-maintenance program.   HEP: Exercises   Wall Push Up with Plus   Prone Shoulder Extension - Single Arm with Dumbbell   Prone  Single Arm Shoulder Y with Dumbbell   Prone Scapular Retraction in Abduction   Prone Shoulder Horizontal Abduction with Thumbs Up     PT Short Term Goals - 10/16/18 1527      PT SHORT TERM GOAL #1   Title  Patient will be independent in home exercise program to improve strength/mobility for better functional independence with ADLs.    Baseline  08/27/18: given HEP; 09/30/18: 2x/week; 10/15/2018: patient reports independence    Time  2    Period  Weeks    Status  Achieved    Target Date  10/15/18        PT Long Term Goals - 10/16/18 1527      PT LONG TERM GOAL #1   Title  Patient will increase R shoulder abduction, ER, and IR to 4+/5 as to improve functional strength for overhead ADLs and playing tennis.    Baseline  08/27/18: ABD 4/5, IR 4/5, ER 4-/5; 09/30/2018: ABD 4/5, IR 4/5, ER 4/5; 10/16/2018: ABD 5/5, IR 5/5, ER 5/5    Time  4    Period  Weeks    Status  Achieved    Target Date  10/29/18      PT LONG TERM GOAL #2   Title  Patient will decrease Quick DASH score by 10% to indicate increased functional ability related to shoulder pain and mobility.    Baseline  08/27/18: 25%; 10/16/2018: 18%    Time  4    Period  Weeks    Status  Not Met    Target Date  10/29/18      PT LONG TERM GOAL #3   Title  Pt will report no pain greater than 3/10 for improved functional ability and recreational ability to play tennis.    Baseline  08/27/18: 5/10; 09/30/18: 4/10; 10/16/2018: 5/10    Time  4    Period  Weeks    Status  Not Met   Target Date  10/29/18            Plan - 10/16/18 1013    Clinical Impression Statement  Patient presents to clinic with improved overall function and a desire to discharge from physical therapy. Patient has made notable gains in RUE function with a decrease in Quick DASH score of 7% and improvement in strength to 5/5 on RUE. Deficits in pain free motion persist, however patient feels he has made maximum gains at this time. Patient demonstrated  independence with HEP and has no questions regarding his self-management. At this time, patient is appropriate to discharge to a self-maintenance program.    Rehab Potential  Fair    Clinical Impairments Affecting Rehab Potential  (+) age, motivated, active, no  structural damage (-) chronicity of problem, migraines    PT Frequency  2x / week    PT Duration  4 weeks    PT Treatment/Interventions  Cryotherapy;Electrical Stimulation;Moist Heat;Ultrasound;Therapeutic activities;Therapeutic exercise;Patient/family education;Manual techniques;Dry needling    PT Next Visit Plan  Outcome measures, goals, recert vs discharge (likely discharge per patient preference), GH mobilizations, soft tissue work, repeat TDN if helpful, TNE, scapular PNF    PT Home Exercise Plan  cervical retractions, scapular retractions    Consulted and Agree with Plan of Care  Patient       Patient will benefit from skilled therapeutic intervention in order to improve the following deficits and impairments:  Decreased strength, Hypomobility, Increased muscle spasms, Pain, Postural dysfunction  Visit Diagnosis: Stiffness of right shoulder, not elsewhere classified  Chronic right shoulder pain  Muscle weakness (generalized)     Problem List Patient Active Problem List   Diagnosis Date Noted  . Intradural mass ([place]) 04/22/2018  . Anemia 11/21/2017  . Benign non-nodular prostatic hyperplasia with lower urinary tract symptoms 11/21/2017  . Erectile dysfunction due to arterial insufficiency 11/21/2017  . Gastro-esophageal reflux disease with esophagitis 11/21/2017  . Lichen planus 62/94/7654  . Lipid disorder 11/21/2017  . Lumbar radicular pain 11/21/2017  . Migraine headache 11/21/2017  . Thyroid nodule 11/21/2017  . Glaucoma 11/21/2017  . Chronic myofascial pain 11/21/2017  . Peyronie's disease 11/12/2016  . Incomplete right bundle branch block 10/18/2016  . GAD (generalized anxiety disorder) 03/29/2016  .  Insomnia, persistent 03/29/2016  . MDD (major depressive disorder), recurrent episode, moderate ([place]) 03/29/2016  . Nondependent alcohol abuse, in remission 03/29/2016  . Cervical spondylosis without myelopathy 04/29/2014  . Degenerative disc disease, cervical 04/29/2014  . Status post cervical spinal fusion 04/29/2014  . Cervical stenosis of spinal canal 11/12/2008   Myles Gip PT, DPT [phone] 10/17/2018, 10:18 AM  [place] MAIN [facility] SERVICES [place], [place], [address] Phone: [phone]   Fax:  [phone]  Name: Michael Garcia MRN: [id number] Date of Birth: 1956-03-16

## 2018-10-17 NOTE — Therapy (Deleted)
[place] MAIN [facility] SERVICES [address] [place], [place], [address] Phone: [phone]   Fax:  [phone]  October 17, 2018   [email or web address] @  Physical Therapy Discharge Summary  Patient: Michael Garcia  MRN: [id number]  Date of Birth: Sep 20, 1956   Diagnosis: Stiffness of right shoulder, not elsewhere classified  Chronic right shoulder pain  Muscle weakness (generalized) Referring Provider (PT): Dr. Roland Rack   The above patient had been seen in Physical Therapy *** times of *** treatments scheduled with *** no shows and *** cancellations.  The treatment consisted of *** The patient is: {improved/worse/unchanged:[id number]}  Subjective: ***  Discharge Findings: ***  Functional Status at Discharge: ***  {JJHER:[id number]}  Plan - 10/16/18 1013    Clinical Impression Statement  Patient presents    Rehab Potential  Fair    Clinical Impairments Affecting Rehab Potential  (+) age, motivated, active, no structural damage (-) chronicity of problem, migraines    PT Frequency  2x / week    PT Duration  4 weeks    PT Treatment/Interventions  Cryotherapy;Electrical Stimulation;Moist Heat;Ultrasound;Therapeutic activities;Therapeutic exercise;Patient/family education;Manual techniques;Dry needling    PT Next Visit Plan  Outcome measures, goals, recert vs discharge (likely discharge per patient preference), GH mobilizations, soft tissue work, repeat TDN if helpful, TNE, scapular PNF    PT Home Exercise Plan  cervical retractions, scapular retractions    Consulted and Agree with Plan of Care  Patient       Sincerely,  Myles Gip PT, DPT [phone]  CC [email or web address] @  [place] [address] [place], [place], [address] Phone: [phone]   Fax:  [phone]  Patient: [name]  MRN: [id number]  Date of Birth: [birth year]

## 2018-10-21 ENCOUNTER — Other Ambulatory Visit: Payer: Self-pay

## 2018-10-22 DIAGNOSIS — S61011A Laceration without foreign body of right thumb without damage to nail, initial encounter: Secondary | ICD-10-CM | POA: Diagnosis not present

## 2018-10-23 DIAGNOSIS — F332 Major depressive disorder, recurrent severe without psychotic features: Secondary | ICD-10-CM | POA: Diagnosis not present

## 2018-10-27 DIAGNOSIS — S61011D Laceration without foreign body of right thumb without damage to nail, subsequent encounter: Secondary | ICD-10-CM | POA: Diagnosis not present

## 2018-10-27 DIAGNOSIS — H524 Presbyopia: Secondary | ICD-10-CM | POA: Diagnosis not present

## 2018-10-28 ENCOUNTER — Other Ambulatory Visit: Payer: Self-pay

## 2018-10-28 ENCOUNTER — Encounter: Payer: Self-pay | Admitting: Family Medicine

## 2018-10-28 MED ORDER — RIZATRIPTAN BENZOATE 10 MG PO TABS: 10.0000 mg | ORAL_TABLET | ORAL | 1 refills | Status: DC | PRN

## 2018-10-30 ENCOUNTER — Telehealth: Payer: Self-pay

## 2018-10-30 DIAGNOSIS — F332 Major depressive disorder, recurrent severe without psychotic features: Secondary | ICD-10-CM | POA: Diagnosis not present

## 2018-10-30 NOTE — Telephone Encounter (Signed)
Michael Garcia (Michael Garcia)  Rx #: [id number]  Emgality 120MG /ML auto-injectors (migraine)  Form MedImpact ePA Form  Created  3 days ago  Sent to Plan  3 minutes ago  Plan Response  2 minutes ago  Submit Clinical Questions  less than a minute ago  Determination  Wait for Determination Please wait for MedImpact to return a determination.

## 2018-11-06 NOTE — Telephone Encounter (Signed)
Fax received for approval.  10-27-18 to 11/26/2018 Approved for up to 41ml per 30 days.

## 2018-11-07 DIAGNOSIS — F5105 Insomnia due to other mental disorder: Secondary | ICD-10-CM | POA: Diagnosis not present

## 2018-11-07 DIAGNOSIS — F411 Generalized anxiety disorder: Secondary | ICD-10-CM | POA: Diagnosis not present

## 2018-11-07 DIAGNOSIS — F331 Major depressive disorder, recurrent, moderate: Secondary | ICD-10-CM | POA: Diagnosis not present

## 2018-11-07 DIAGNOSIS — S99922A Unspecified injury of left foot, initial encounter: Secondary | ICD-10-CM | POA: Diagnosis not present

## 2018-11-07 DIAGNOSIS — S93602A Unspecified sprain of left foot, initial encounter: Secondary | ICD-10-CM | POA: Diagnosis not present

## 2018-11-10 ENCOUNTER — Other Ambulatory Visit: Payer: Self-pay | Admitting: Family Medicine

## 2018-11-26 DIAGNOSIS — F331 Major depressive disorder, recurrent, moderate: Secondary | ICD-10-CM | POA: Diagnosis not present

## 2018-11-26 DIAGNOSIS — F411 Generalized anxiety disorder: Secondary | ICD-10-CM | POA: Diagnosis not present

## 2018-11-26 DIAGNOSIS — F5105 Insomnia due to other mental disorder: Secondary | ICD-10-CM | POA: Diagnosis not present

## 2018-12-08 ENCOUNTER — Other Ambulatory Visit: Payer: Self-pay | Admitting: Family Medicine

## 2018-12-09 DIAGNOSIS — R21 Rash and other nonspecific skin eruption: Secondary | ICD-10-CM | POA: Diagnosis not present

## 2018-12-12 DIAGNOSIS — F331 Major depressive disorder, recurrent, moderate: Secondary | ICD-10-CM | POA: Diagnosis not present

## 2018-12-12 DIAGNOSIS — F5105 Insomnia due to other mental disorder: Secondary | ICD-10-CM | POA: Diagnosis not present

## 2018-12-12 DIAGNOSIS — F411 Generalized anxiety disorder: Secondary | ICD-10-CM | POA: Diagnosis not present

## 2018-12-15 ENCOUNTER — Other Ambulatory Visit: Payer: Self-pay | Admitting: Family Medicine

## 2018-12-20 ENCOUNTER — Encounter: Payer: Self-pay | Admitting: Family Medicine

## 2019-01-16 ENCOUNTER — Other Ambulatory Visit: Payer: Self-pay | Admitting: Family Medicine

## 2019-01-25 ENCOUNTER — Other Ambulatory Visit: Payer: Self-pay

## 2019-01-25 ENCOUNTER — Other Ambulatory Visit: Payer: Self-pay | Admitting: Family Medicine

## 2019-01-26 MED ORDER — ALFUZOSIN HCL ER 10 MG PO TB24: 10.0000 mg | ORAL_TABLET | Freq: Every day | ORAL | 0 refills | Status: DC

## 2019-02-06 ENCOUNTER — Encounter: Payer: Self-pay | Admitting: Family Medicine

## 2019-02-07 ENCOUNTER — Other Ambulatory Visit: Payer: Self-pay

## 2019-02-07 MED ORDER — RIZATRIPTAN BENZOATE 10 MG PO TABS: 10.0000 mg | ORAL_TABLET | ORAL | 1 refills | Status: DC | PRN

## 2019-02-19 ENCOUNTER — Ambulatory Visit (INDEPENDENT_AMBULATORY_CARE_PROVIDER_SITE_OTHER): Payer: 59 | Admitting: Family Medicine

## 2019-02-19 ENCOUNTER — Encounter: Payer: Self-pay | Admitting: Family Medicine

## 2019-02-19 ENCOUNTER — Ambulatory Visit: Payer: Self-pay

## 2019-02-19 VITALS — BP 138/84 | HR 87 | Temp 98.4°F | Ht 76.0 in | Wt 237.0 lb

## 2019-02-19 DIAGNOSIS — R05 Cough: Secondary | ICD-10-CM | POA: Diagnosis not present

## 2019-02-19 DIAGNOSIS — R6889 Other general symptoms and signs: Secondary | ICD-10-CM

## 2019-02-19 DIAGNOSIS — R52 Pain, unspecified: Secondary | ICD-10-CM | POA: Diagnosis not present

## 2019-02-19 DIAGNOSIS — R059 Cough, unspecified: Secondary | ICD-10-CM

## 2019-02-19 DIAGNOSIS — R5383 Other fatigue: Secondary | ICD-10-CM

## 2019-02-19 DIAGNOSIS — Z20822 Contact with and (suspected) exposure to covid-19: Secondary | ICD-10-CM

## 2019-02-19 NOTE — Patient Instructions (Signed)
Video visit

## 2019-02-19 NOTE — Progress Notes (Signed)
Phone [phone]   Subjective:  Virtual visit via Video note. Chief complaint: Chief Complaint  Patient presents with  . Cough   This visit type was conducted due to national recommendations for restrictions regarding the COVID-19 Pandemic (e.g. social distancing).  This format is felt to be most appropriate for this patient at this time balancing risks to patient and risks to population by having him in for in person visit.  All issues noted in this document were discussed and addressed.  No physical exam was performed (except for noted visual exam or audio findings with Telehealth visits).  The patient has consented to conduct a Telehealth visit and understands insurance will be billed.   Our team/I connected with Michael Garcia on 02/19/19 at  4:00 PM EDT by a video enabled telemedicine application ([email or web address]) and verified that I am speaking with the correct person using two identifiers.  Location patient: Home[facility] Location provider: [facility], office Persons participating in the virtual visit:  patient  Our team/I discussed the limitations of evaluation and management by telemedicine and the availability of in person appointments. In light of current covid-19 pandemic, patient also understands that we are trying to protect them by minimizing in office contact if at all possible.  The patient expressed consent for telemedicine visit and agreed to proceed. Patient understands insurance will be billed.   ROS- has not had fever. Had GI upset last night. Feels fatigued. Has cough.    Past Medical History-  Patient Active Problem List   Diagnosis Date Noted  . Intradural mass ([place]) 04/22/2018  . Anemia 11/21/2017  . Benign non-nodular prostatic hyperplasia with lower urinary tract symptoms 11/21/2017  . Erectile dysfunction due to arterial insufficiency 11/21/2017  . Gastro-esophageal reflux disease with esophagitis 11/21/2017  . Lichen planus 29/52/8413  . Lipid disorder 11/21/2017  .  Lumbar radicular pain 11/21/2017  . Migraine headache 11/21/2017  . Thyroid nodule 11/21/2017  . Glaucoma 11/21/2017  . Chronic myofascial pain 11/21/2017  . Peyronie's disease 11/12/2016  . Incomplete right bundle branch block 10/18/2016  . GAD (generalized anxiety disorder) 03/29/2016  . Insomnia, persistent 03/29/2016  . MDD (major depressive disorder), recurrent episode, moderate ([place]) 03/29/2016  . Nondependent alcohol abuse, in remission 03/29/2016  . Cervical spondylosis without myelopathy 04/29/2014  . Degenerative disc disease, cervical 04/29/2014  . Status post cervical spinal fusion 04/29/2014  . Cervical stenosis of spinal canal 11/12/2008   Medications- reviewed and updated Current Outpatient Medications  Medication Sig Dispense Refill  . alfuzosin (UROXATRAL) 10 MG 24 hr tablet Take 1 tablet (10 mg total) by mouth daily. 90 tablet 0  . ALPRAZolam (XANAX) 1 MG tablet   2  . cetirizine (ZYRTEC) 10 MG tablet Take 1 tablet by mouth daily.    Marland Kitchen esomeprazole (NEXIUM) 40 MG capsule TAKE 1 CAPSULE BY MOUTH DAILY 90 capsule 2  . FIBER PO Take by mouth.    . fluticasone (FLONASE) 50 MCG/ACT nasal spray Place 2 sprays into both nostrils daily. 16 g 5  . gabapentin (NEURONTIN) 300 MG capsule TAKE 1 CAPSULE BY MOUTH TWICE DURING THE DAY AND 2 CAPSULES AT NIGHT 120 capsule 3  . Galcanezumab-gnlm (EMGALITY) 120 MG/ML SOAJ Inject 120 mg into the skin as needed. 1 mL 3  . lamoTRIgine (LAMICTAL) 150 MG tablet Take 150 mg by mouth 2 (two) times daily.    Marland Kitchen lidocaine (LIDODERM) 5 % USE AS DIRECTED ONCE DAILY EXTERNALLY 30 patch 2  . Lidocaine 5 % CREA     .  Melatonin 3 MG TABS     . methocarbamol (ROBAXIN) 750 MG tablet Take 1 tablet (750 mg total) by mouth 3 (three) times daily. 90 tablet 3  . MULTIPLE VITAMIN PO Take by mouth.    . rizatriptan (MAXALT) 10 MG tablet Take 1 tablet (10 mg total) by mouth as needed for migraine. 10 tablet 1  . sildenafil (VIAGRA) 100 MG tablet     .  finasteride (PROSCAR) 5 MG tablet Take 1 tablet (5 mg total) by mouth daily. (Patient not taking: Reported on 02/19/2019)    . tacrolimus (PROTOPIC) 0.1 % ointment APPLY TO AFFECTED AREAS UTD    . traZODone (DESYREL) 100 MG tablet Take 1 tablet (100 mg total) by mouth at bedtime as needed for sleep. 30 tablet 2  . TRINTELLIX 10 MG TABS tablet TK 1 T PO D WITH BRE     No current facility-administered medications for this visit.      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 4 in facility have tested positive. Inmates are getting segregated. Only with PPE in last week  2nd day of illness today. Last night- Felt fatigued, threw up, had body aches last night. He was not allowed into the facility today. Does not have fever.  Feeling better today but feels really tired. Has been coughing some- thought it could be allergies.   He was not allowed to go into facility due to symptoms. He drove back to [place]- hasn't moved down to [place].  A/P: Cough, body aches, fatigue with potential covid 19 exposure in high risk area ([place] in facility that has confirmed cases). I am strongly concerned for covid 19. Advised 14 days self quarantine and that patient  In addition stay out of work for 3 days once symptom free.   In light of age and multiple medical conditions- I am also going to send Dr. Juleen China a note- Patient was open to idea of video visit check in 1 week from now - in particular to discuss if he should remain at home for more prolonged period due to age and multiple health conditions.   Future Appointments  Date Time  Provider [place]  02/26/2019  9:00 AM Kathrynn Ducking, MD [facility] None   Lab/Order associations: Cough  Body aches  Fatigue, unspecified type  Suspected Covid-19 Virus Infection  Return precautions advised.  Garret Reddish, MD

## 2019-02-19 NOTE — Telephone Encounter (Signed)
Returned call to patient. Left VM to return call to office. 

## 2019-02-19 NOTE — Telephone Encounter (Signed)
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 He states that detention center is set up in pods and many pods are on lock down because of illness suspected COVID-19. He states that he has been in these pods  last was yesterday. He states tht he usually spends about 15 minutes in these areas and that the conversations are up close a lot of the time. He was sent home today because of nausea vomiting, body aches, fatigue. He denies fever. His employer states that he will need a note to return to work. Per protocol call was transferred to [facility] at [facility] office. Care advice was read to patient. Pt verbalized understanding. Reason for Disposition . [1] Mild body aches, chills, diarrhea, headache, runny nose, or sore throat AND [2] within 14 days of COVID-Exposure  Answer Assessment - Initial Assessment Questions 1. CLOSE CONTACT: "Who is the person with the confirmed or suspected COVID-19 infection that you were exposed to?"     Suspected lock down spots 2. PLACE of CONTACT: "Where were you when you were exposed to COVID-19?" (e.g., home, school, medical waiting room; which city?)     work 3. TYPE of CONTACT: "How much contact was there?" (e.g., sitting next to, live in same house, work in same office, same building)     Sitting next to them 4. DURATION of CONTACT: "How long were you in contact with the COVID-19 patient?" (e.g., a few seconds, passed by person, a few minutes, live with the patient)     15 minutes per visit. 32 pods 5 sick pods 5. DATE of CONTACT: "When did you have contact with a COVID-19 patient?" (e.g., how many days ago)    yesterday 6. TRAVEL: "Have you traveled out of the country recently?" If so, "When and where?"     * Also ask about out-of-state travel, since the CDC has identified some high risk cities for community spread in the [place].     * Note: Travel becomes less relevant if there is widespread community transmission where the patient  lives.    [place] works 7. COMMUNITY SPREAD: "Are there lots of cases or COVID-19 (community spread) where you live?" (See public health department website, if unsure)   * MAJOR community spread: high number of cases; numbers of cases are increasing; many people hospitalized.   * MINOR community spread: low number of cases; not increasing; few or no people hospitalized    Major 8. SYMPTOMS: "Do you have any symptoms?" (e.g., fever, cough, breathing difficulty)    Nausea vomiting minor phlegm 9. PREGNANCY OR POSTPARTUM: "Is there any chance you are pregnant?" "When was your last menstrual period?" "Did you deliver in the last 2 weeks?"  N/A 10. HIGH RISK: "Do you have any heart or lung problems? Do you have a weak immune system?" (e.g., CHF, COPD, asthma, HIV positive, chemotherapy, renal failure, diabetes mellitus, sickle cell anemia)      no  Protocols used: CORONAVIRUS (COVID-19) EXPOSURE-A-AH

## 2019-02-19 NOTE — Telephone Encounter (Signed)
Michael Garcia, schedule virtual visit to discuss further with a provider.

## 2019-02-19 NOTE — Telephone Encounter (Signed)
Called patient has app made today with hunter at 58

## 2019-02-20 ENCOUNTER — Telehealth: Payer: Self-pay | Admitting: Neurology

## 2019-02-20 NOTE — Telephone Encounter (Signed)
Pt called and has consented to a VIRTUAL VISIT and for the insurance to be billed as such.

## 2019-02-23 ENCOUNTER — Encounter: Payer: Self-pay | Admitting: Family Medicine

## 2019-02-23 NOTE — Telephone Encounter (Signed)
Due to current COVID 19 pandemic, our office is severely reducing in office visits for at least the next 2 weeks, in order to minimize the risk to our patients and healthcare providers.   I contacted the pt and he consented to virtual video visit for 02/26/19 at 9 am.   Pt understands that although there may be some limitations with this type of visit, we will take all precautions to reduce any security or privacy concerns.  Pt understands that this will be treated like an in office visit and we will file with pt's insurance, and there may be a patient responsible charge related to this service.  Pt's email is [email or web address]. Pt understands that the cis[employer] webex software must be downloaded and operational on the device pt plans to use for the visit.   Pre charting for 02/26/19 visit has been completed.

## 2019-02-23 NOTE — Telephone Encounter (Signed)
Can you update pt insurance information  Thanks!

## 2019-02-23 NOTE — Telephone Encounter (Signed)
Please see message . Thank you .

## 2019-02-24 ENCOUNTER — Encounter: Payer: Self-pay | Admitting: Family Medicine

## 2019-02-26 ENCOUNTER — Other Ambulatory Visit: Payer: Self-pay | Admitting: Neurology

## 2019-02-26 ENCOUNTER — Other Ambulatory Visit: Payer: Self-pay

## 2019-02-26 ENCOUNTER — Encounter: Payer: Self-pay | Admitting: Neurology

## 2019-02-26 ENCOUNTER — Ambulatory Visit (INDEPENDENT_AMBULATORY_CARE_PROVIDER_SITE_OTHER): Payer: 59 | Admitting: Neurology

## 2019-02-26 DIAGNOSIS — G43C1 Periodic headache syndromes in child or adult, intractable: Secondary | ICD-10-CM | POA: Diagnosis not present

## 2019-02-26 DIAGNOSIS — M5416 Radiculopathy, lumbar region: Secondary | ICD-10-CM

## 2019-02-26 MED ORDER — ERENUMAB-AOOE 140 MG/ML ~~LOC~~ SOAJ: 140.0000 mg | SUBCUTANEOUS | 5 refills | Status: DC

## 2019-02-26 MED ORDER — GABAPENTIN 600 MG PO TABS: 600.0000 mg | ORAL_TABLET | Freq: Three times a day (TID) | ORAL | 3 refills | Status: DC

## 2019-02-26 MED ORDER — GALCANEZUMAB-GNLM 120 MG/ML ~~LOC~~ SOAJ: 120.0000 mg | SUBCUTANEOUS | 5 refills | Status: DC

## 2019-02-26 MED ORDER — DIVALPROEX SODIUM 500 MG PO DR TAB: DELAYED_RELEASE_TABLET | ORAL | 3 refills | Status: DC

## 2019-02-26 NOTE — Progress Notes (Signed)
     Virtual Visit via Video Note  I connected with Michael Garcia on 02/26/19 at  9:00 AM EDT by a video enabled telemedicine application and verified that I am speaking with the correct person using two identifiers.   I discussed the limitations of evaluation and management by telemedicine and the availability of in person appointments. The patient expressed understanding and agreed to proceed.  History of Present Illness: Michael Garcia is a 63 year old right-handed white male with a history of migraine headaches that occur in the left occipital distribution, and are associated with discomfort in the left shoulder and neck.  The patient is having some back and left leg discomfort as well following a resection of a schwannoma affecting the conus medullaris.  The patient has been on Emgality, he finds that it has reduced his headache frequency by at least 50% but he is still having at least 8 headache days a month.  He is taking Maxalt with some benefit.  With the headaches now, the headaches are less severe but the arm associated with more blurred vision and photophobia.  He is unable to work during the headache.  The patient has been on gabapentin for his back problem, he stopped the medication suddenly and began having significant leg cramps.  He is back on this medication taking 300 mg twice during the day and 600 mg at night.  He does have some ongoing problems with insomnia, he is on trazodone but only on 100 mg at night.  He is followed through psychiatry.  His depression is not fully controlled.  He works as a Clinical biochemist for [employer].  The patient does report some mild memory problems as well.  In the past, Topamax, trazodone, and gabapentin have not been effective for treating his headaches.  The increase in gabapentin recently did not help.   Observations/Objective: WebEx evaluation reveals that the patient is alert and cooperative.  Facial symmetry is present.  Good extraocular movements are  seen, full range of movement the cervical spine is noted.  Speech is well enunciated, not aphasic or dysarthric.  Assessment and Plan: 1.  Intractable migraine headache  2.  History of schwannoma resection, conus medullaris  3.  Residual low back pain, left leg pain  We will go up on the gabapentin taking 600 mg 3 times daily.  The patient will be placed on Depakote for his headache and hopefully to help with depression taking 500 mg daily for 3 weeks and then go to 500 mg twice daily.  The patient is on Lamictal as well, the Depakote increase will need to occur slowly.  The patient will be given a prescription for the 600 mg tablets of gabapentin.  He may try going up on his trazodone for sleep 250 mg at night.  He will follow-up here in about 4 months.  Follow Up Instructions: 83-month follow-up with me.   I discussed the assessment and treatment plan with the patient. The patient was provided an opportunity to ask questions and all were answered. The patient agreed with the plan and demonstrated an understanding of the instructions.   The patient was advised to call back or seek an in-person evaluation if the symptoms worsen or if the condition fails to improve as anticipated.  I provided 30 minutes of non-face-to-face time during this encounter.   Kathrynn Ducking, MD

## 2019-02-27 ENCOUNTER — Encounter: Payer: Self-pay | Admitting: Family Medicine

## 2019-02-27 ENCOUNTER — Other Ambulatory Visit: Payer: Self-pay

## 2019-02-27 ENCOUNTER — Ambulatory Visit (INDEPENDENT_AMBULATORY_CARE_PROVIDER_SITE_OTHER): Payer: 59 | Admitting: Family Medicine

## 2019-02-27 VITALS — BP 122/77 | HR 66 | Temp 97.5°F | Ht 76.0 in | Wt 226.0 lb

## 2019-02-27 DIAGNOSIS — R6889 Other general symptoms and signs: Secondary | ICD-10-CM

## 2019-02-27 DIAGNOSIS — Z9189 Other specified personal risk factors, not elsewhere classified: Secondary | ICD-10-CM | POA: Diagnosis not present

## 2019-02-27 DIAGNOSIS — R509 Fever, unspecified: Secondary | ICD-10-CM

## 2019-02-27 DIAGNOSIS — Z20822 Contact with and (suspected) exposure to covid-19: Secondary | ICD-10-CM

## 2019-02-27 DIAGNOSIS — Z7189 Other specified counseling: Secondary | ICD-10-CM | POA: Diagnosis not present

## 2019-02-27 DIAGNOSIS — Z20828 Contact with and (suspected) exposure to other viral communicable diseases: Secondary | ICD-10-CM | POA: Diagnosis not present

## 2019-02-27 NOTE — Telephone Encounter (Signed)
Patient has app today

## 2019-02-27 NOTE — Progress Notes (Addendum)
Virtual Visit via Video   I connected with Michael Garcia by a video enabled telemedicine application and verified that I am speaking with the correct person using two identifiers. Location patient: Home Location provider: [facility], Office Persons participating in the virtual visit: Digby Groeneveld, Briscoe Deutscher, DO   I discussed the limitations of evaluation and management by telemedicine and the availability of in person appointments. The patient expressed understanding and agreed to proceed.  Subjective:   HPI: Patient is following up from visit with Dr. Yong Channel. He was feeling better until last night. His temperature did get up to 99.8. He also had muscle aches and a headache. He is still having a lot of fatigue. 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 Returned home when he started having symptoms.  His assistant and multiple coworkers have tested positive for Covid-19. His wife and daughter have both started having symptoms recently.   Reviewed all precautions and expectations with prevention of Covid-19. Stressed importance of staying at home and limit activity that may cause exertion.     Review of Systems  Constitutional: Positive for chills, fever and malaise/fatigue. Negative for weight loss.  Respiratory: Positive for cough and shortness of breath. Negative for wheezing.   Cardiovascular: Negative for chest pain, palpitations and leg swelling.  Gastrointestinal: Negative for abdominal pain, constipation, diarrhea, nausea and vomiting.  Genitourinary: Negative for dysuria and urgency.  Musculoskeletal: Positive for myalgias. Negative for joint pain.  Skin: Negative for rash.  Neurological: Positive for headaches. Negative for dizziness.  Psychiatric/Behavioral: Negative for depression, substance abuse and suicidal ideas. The patient is not nervous/anxious.    Patient Active Problem List   Diagnosis Date Noted  . Intradural mass ([place])  04/22/2018  . Anemia 11/21/2017  . Benign non-nodular prostatic hyperplasia with lower urinary tract symptoms 11/21/2017  . Erectile dysfunction due to arterial insufficiency 11/21/2017  . Gastro-esophageal reflux disease with esophagitis 11/21/2017  . Lichen planus 63/11/6008  . Lipid disorder 11/21/2017  . Lumbar radicular pain 11/21/2017  . Migraine headache 11/21/2017  . Thyroid nodule 11/21/2017  . Glaucoma 11/21/2017  . Chronic myofascial pain 11/21/2017  . Peyronie's disease 11/12/2016  . Incomplete right bundle branch block 10/18/2016  . GAD (generalized anxiety disorder) 03/29/2016  . Insomnia, persistent 03/29/2016  . MDD (major depressive disorder), recurrent episode, moderate ([place]) 03/29/2016  . Nondependent alcohol abuse, in remission 03/29/2016  . Cervical spondylosis without myelopathy 04/29/2014  . Degenerative disc disease, cervical 04/29/2014  . Status post cervical spinal fusion 04/29/2014  . Cervical stenosis of spinal canal 11/12/2008    Social History   Tobacco Use  . Smoking status: Former Smoker    Types: Cigarettes    Last attempt to quit: 1994    Years since quitting: 26.3  . Smokeless tobacco: Never Used  Substance Use Topics  . Alcohol use: No    Frequency: Never   Current Outpatient Medications:  .  alfuzosin (UROXATRAL) 10 MG 24 hr tablet, Take 1 tablet (10 mg total) by mouth daily., Disp: 90 tablet, Rfl: 0 .  ALPRAZolam (XANAX) 1 MG tablet, , Disp: , Rfl: 2 .  cetirizine (ZYRTEC) 10 MG tablet, Take 1 tablet by mouth daily., Disp: , Rfl:  .  divalproex (DEPAKOTE) 500 MG DR tablet, 1 tablet daily for 3 weeks and then take 1 tablet twice daily, Disp: 60 tablet, Rfl: 3 .  Erenumab-aooe (AIMOVIG) 140 MG/ML SOAJ, Inject 140 mg into the skin every 30 (  thirty) days., Disp: 1 pen, Rfl: 5 .  esomeprazole (NEXIUM) 40 MG capsule, TAKE 1 CAPSULE BY MOUTH DAILY, Disp: 90 capsule, Rfl: 2 .  FIBER PO, Take by mouth., Disp: , Rfl:  .  finasteride (PROSCAR) 5  MG tablet, Take 1 tablet (5 mg total) by mouth daily., Disp: , Rfl:  .  fluticasone (FLONASE) 50 MCG/ACT nasal spray, Place 2 sprays into both nostrils daily., Disp: 16 g, Rfl: 5 .  gabapentin (NEURONTIN) 600 MG tablet, Take 1 tablet (600 mg total) by mouth 3 (three) times daily., Disp: 270 tablet, Rfl: 3 .  lamoTRIgine (LAMICTAL) 150 MG tablet, Take 150 mg by mouth 2 (two) times daily., Disp: , Rfl:  .  lidocaine (LIDODERM) 5 %, USE AS DIRECTED ONCE DAILY EXTERNALLY, Disp: 30 patch, Rfl: 2 .  Lidocaine 5 % CREA, , Disp: , Rfl:  .  Melatonin 3 MG TABS, , Disp: , Rfl:  .  methocarbamol (ROBAXIN) 750 MG tablet, Take 1 tablet (750 mg total) by mouth 3 (three) times daily., Disp: 90 tablet, Rfl: 3 .  MULTIPLE VITAMIN PO, Take by mouth., Disp: , Rfl:  .  rizatriptan (MAXALT) 10 MG tablet, Take 1 tablet (10 mg total) by mouth as needed for migraine., Disp: 10 tablet, Rfl: 1 .  sildenafil (VIAGRA) 100 MG tablet, , Disp: , Rfl:  .  tacrolimus (PROTOPIC) 0.1 % ointment, APPLY TO AFFECTED AREAS UTD, Disp: , Rfl:  .  traZODone (DESYREL) 100 MG tablet, Take 1 tablet (100 mg total) by mouth at bedtime as needed for sleep., Disp: 30 tablet, Rfl: 2 .  TRINTELLIX 10 MG TABS tablet, TK 1 T PO D WITH BRE, Disp: , Rfl:   Allergies  Allergen Reactions  . Bacitracin Itching and Rash  . Iodine Itching and Rash    IV Iodine   . Sulfamethoxazole-Trimethoprim Rash    Objective:   VITALS: Per patient if applicable, see vitals. GENERAL: Alert and in no acute distress. HEENT: Atraumatic, conjunctiva clear, no obvious abnormalities on inspection of external nose and ears. NECK: Normal movements of the head and neck. CARDIOPULMONARY: No increased WOB. Speaking in clear sentences. Cough, dry. MS: Moves all visible extremities without noticeable abnormality. PSYCH: Pleasant and cooperative, well-groomed. Speech normal rate and rhythm. Affect is appropriate. Insight and judgement are appropriate. Attention is  focused, linear, and appropriate.  NEURO: CN grossly intact. Oriented as arrived to appointment on time with no prompting. Moves both UE equally.  SKIN: No obvious lesions, wounds, erythema, or cyanosis noted on face or hands.  Assessment and Plan:   Elihu was seen today for follow-up.  Diagnoses and all orders for this visit:  Suspected Covid-19 Virus Infection Comments: Stable. Reviewed precautions and red flags. Put plan in place for change in symptoms.   Educated About Covid-19 Virus Infection Comments: See AVS.  At risk for spreading communicable disease  Exposure to Covid-19 Virus  Fever and chills    . Reviewed expectations re: course of current medical issues. . Discussed self-management of symptoms. . Outlined signs and symptoms indicating need for more acute intervention. . Patient verbalized understanding and all questions were answered. Marland Kitchen Health Maintenance issues including appropriate healthy diet, exercise, and smoking avoidance were discussed with patient. . See orders for this visit as documented in the electronic medical record.  Briscoe Deutscher, DO

## 2019-02-28 ENCOUNTER — Encounter: Payer: Self-pay | Admitting: Family Medicine

## 2019-03-05 ENCOUNTER — Encounter: Payer: Self-pay | Admitting: Family Medicine

## 2019-03-06 NOTE — Telephone Encounter (Signed)
PA initiated via [email or web address].

## 2019-03-09 MED ORDER — DEXLANSOPRAZOLE 30 MG PO CPDR: 30.0000 mg | DELAYED_RELEASE_CAPSULE | Freq: Every day | ORAL | 6 refills | Status: DC

## 2019-03-09 NOTE — Telephone Encounter (Signed)
Please send script

## 2019-03-09 NOTE — Telephone Encounter (Signed)
Please advise 

## 2019-03-09 NOTE — Addendum Note (Signed)
Addended by: Briscoe Deutscher R on: 03/09/2019 03:39 PM   Modules accepted: Orders

## 2019-03-10 NOTE — Telephone Encounter (Signed)
Michael Garcia ([name])   This request has received a Favorable outcome. Please note any additional information provided by [facility] at the bottom of your screen. You will also receive a faxed copy of the determination.

## 2019-03-22 ENCOUNTER — Encounter: Payer: Self-pay | Admitting: Family Medicine

## 2019-03-23 NOTE — Telephone Encounter (Signed)
Called pt and schedule MyChart video visit with Dr. Juleen China for this coming Wednesday to discuss concerns in more detail.

## 2019-03-24 NOTE — Progress Notes (Signed)
Virtual Visit via Video   Due to the COVID-19 pandemic, this visit was completed with telemedicine (audio/video) technology to reduce patient and provider exposure as well as to preserve personal protective equipment.   I connected with [name] by a video enabled telemedicine application and verified that I am speaking with the correct person using two identifiers. Location patient: Home Location provider: [facility], Office Persons participating in the virtual visit: Turon Kilmer, Briscoe Deutscher, DO   I discussed the limitations of evaluation and management by telemedicine and the availability of in person appointments. The patient expressed understanding and agreed to proceed.  Care Team   Patient Care Team: Briscoe Deutscher, DO as PCP - General (Family Medicine) Magnus Sinning, MD as Consulting Physician (Physical Medicine and Rehabilitation) Alexis Frock, MD as Consulting Physician (Urology) Chauncey Mann, MD as Referring Physician (Psychiatry) Chauncey Mann, MD as Referring Physician (Psychiatry)  Subjective:   HPI:   Recent myChart message: Can I get a Covid test after you've had it. We still have 67 reported staff members with Covid. God knows how many actually have the disease. We have 14 detainees off record at the [facility]. I left my job Friday but have not resigned. I need a path to unemployment. My mental health is a mess. I am working with Dr. Luberta Robertson out of the [place], behavioral health system. I'm at [facility] 20 mg and 150x2 Lamictal. I have reached out to them. What do I do?   1. Gastro-esophageal reflux disease with esophagitis Has tried several medications in the past including Tums, Maalos, Gaviscon, Mylanta, Ranitidine, Famotidine, Omperaole, Cimedtide, and Lansoprazole. Was recently prescribed Dexilant, got little relief with this. Has gotten relief with Esomeprazole in the past. Prior Auth for Esomeprazole has been  obtained/approved.   2. GAD (generalized anxiety disorder) Patient has had increased anxiety and depression due to work issues.   3. MDD (major depressive disorder), recurrent episode, moderate ([facility]) Increased issues with work.   Review of Systems  Constitutional: Negative for chills and fever.  HENT: Negative for hearing loss and tinnitus.   Eyes: Negative for blurred vision and double vision.  Respiratory: Negative for cough and hemoptysis.   Cardiovascular: Negative for chest pain and palpitations.  Gastrointestinal: Negative for heartburn and nausea.  Genitourinary: Negative for dysuria and urgency.  Musculoskeletal: Positive for joint pain and myalgias. Negative for neck pain.  Skin: Negative for rash.  Neurological: Negative for dizziness and headaches.  Endo/Heme/Allergies: Does not bruise/bleed easily.  Psychiatric/Behavioral: Positive for depression. Negative for suicidal ideas. The patient is nervous/anxious.     Patient Active Problem List   Diagnosis Date Noted  . Other ejaculatory dysfunction 09/11/2018  . Stricture of anterior urethra in male 09/11/2018  . Rotator cuff tendinitis, right 08/25/2018  . Intradural mass ([place]) 04/22/2018  . Anemia 11/21/2017  . Benign non-nodular prostatic hyperplasia with lower urinary tract symptoms 11/21/2017  . Erectile dysfunction due to arterial insufficiency 11/21/2017  . Gastro-esophageal reflux disease with esophagitis 11/21/2017  . Lichen planus 47/42/5956  . Lipid disorder 11/21/2017  . Lumbar radicular pain 11/21/2017  . Migraine headache 11/21/2017  . Thyroid nodule 11/21/2017  . Glaucoma 11/21/2017  . Chronic myofascial pain 11/21/2017  . Peyronie's disease 11/12/2016  . Incomplete right bundle branch block 10/18/2016  . GAD (generalized anxiety disorder) 03/29/2016  . Insomnia, persistent 03/29/2016  . MDD (major depressive disorder), recurrent episode, moderate ([place]) 03/29/2016  . Nondependent alcohol abuse, in  remission 03/29/2016  .  Cervical spondylosis without myelopathy 04/29/2014  . Degenerative disc disease, cervical 04/29/2014  . Status post cervical spinal fusion 04/29/2014  . Cervical stenosis of spinal canal 11/12/2008    Social History   Tobacco Use  . Smoking status: Former Smoker    Types: Cigarettes    Last attempt to quit: 1994    Years since quitting: 26.4  . Smokeless tobacco: Never Used  Substance Use Topics  . Alcohol use: No    Frequency: Never   Current Outpatient Medications:  .  alfuzosin (UROXATRAL) 10 MG 24 hr tablet, Take 1 tablet (10 mg total) by mouth daily., Disp: 90 tablet, Rfl: 0 .  ALPRAZolam (XANAX) 1 MG tablet, , Disp: , Rfl: 2 .  cetirizine (ZYRTEC) 10 MG tablet, Take 1 tablet by mouth daily., Disp: , Rfl:  .  divalproex (DEPAKOTE) 500 MG DR tablet, 1 tablet daily for 3 weeks and then take 1 tablet twice daily, Disp: 60 tablet, Rfl: 3 .  Erenumab-aooe (AIMOVIG) 140 MG/ML SOAJ, Inject 140 mg into the skin every 30 (thirty) days., Disp: 1 pen, Rfl: 5 .  esomeprazole (NEXIUM) 40 MG capsule, TAKE 1 CAPSULE BY MOUTH DAILY, Disp: 90 capsule, Rfl: 2 .  FIBER PO, Take by mouth., Disp: , Rfl:  .  finasteride (PROSCAR) 5 MG tablet, Take 1 tablet (5 mg total) by mouth daily., Disp: , Rfl:  .  fluticasone (FLONASE) 50 MCG/ACT nasal spray, Place 2 sprays into both nostrils daily., Disp: 16 g, Rfl: 5 .  gabapentin (NEURONTIN) 600 MG tablet, Take 1 tablet (600 mg total) by mouth 3 (three) times daily., Disp: 270 tablet, Rfl: 3 .  lamoTRIgine (LAMICTAL) 150 MG tablet, Take 150 mg by mouth 2 (two) times daily., Disp: , Rfl:  .  lidocaine (LIDODERM) 5 %, USE AS DIRECTED ONCE DAILY EXTERNALLY, Disp: 30 patch, Rfl: 2 .  Lidocaine 5 % CREA, , Disp: , Rfl:  .  Melatonin 3 MG TABS, , Disp: , Rfl:  .  methocarbamol (ROBAXIN) 750 MG tablet, Take 1 tablet (750 mg total) by mouth 3 (three) times daily., Disp: 90 tablet, Rfl: 3 .  MULTIPLE VITAMIN PO, Take by mouth., Disp: , Rfl:    .  rizatriptan (MAXALT) 10 MG tablet, Take 1 tablet (10 mg total) by mouth as needed for migraine., Disp: 10 tablet, Rfl: 1 .  sildenafil (VIAGRA) 100 MG tablet, , Disp: , Rfl:  .  tacrolimus (PROTOPIC) 0.1 % ointment, APPLY TO AFFECTED AREAS UTD, Disp: , Rfl:  .  traZODone (DESYREL) 100 MG tablet, Take 1 tablet (100 mg total) by mouth at bedtime as needed for sleep., Disp: 30 tablet, Rfl: 2 .  TRINTELLIX 10 MG TABS tablet, TK 1 T PO D WITH BRE, Disp: , Rfl:   Allergies  Allergen Reactions  . Bacitracin Itching and Rash  . Iodine Itching and Rash    IV Iodine   . Sulfamethoxazole-Trimethoprim Rash    Objective:   VITALS: Per patient if applicable, see vitals. GENERAL: Alert and in no acute distress. Tearful at times.  CARDIOPULMONARY: No increased WOB. Speaking in clear sentences.  PSYCH: Pleasant and cooperative. Speech normal rate and rhythm. Affect is appropriate. Insight and judgement are appropriate. Attention is focused, linear, and appropriate.  NEURO: Oriented as arrived to appointment on time with no prompting.   Depression screen [facility] 2/9 09/30/2018 05/05/2018 11/21/2017  Decreased Interest 1 1 0  Down, Depressed, Hopeless 1 1 1   PHQ - 2 Score 2 2 1  Altered sleeping 2 0 3  Tired, decreased energy 1 1 0  Change in appetite 2 1 0  Feeling bad or failure about yourself  0 1 0  Trouble concentrating 1 1 1   Moving slowly or fidgety/restless 0 0 0  Suicidal thoughts 0 0 0  PHQ-9 Score 8 6 5   Difficult doing work/chores Not difficult at all - -     Assessment and Plan:   Lesly was seen today for follow-up.  Diagnoses and all orders for this visit:  Gastro-esophageal reflux disease with esophagitis Controlled.  No signs of complications, medication side effects, or red flags.  Continue current regimen.    GAD (generalized anxiety disorder) See letter.  MDD (major depressive disorder), recurrent episode, moderate ([place]) Med changes per Psychiatry.  Marland Kitchen COVID-19  Education: The signs and symptoms of COVID-19 were discussed with the patient and how to seek care for testing if needed. The importance of social distancing was discussed today. . Reviewed expectations re: course of current medical issues. . Discussed self-management of symptoms. . Outlined signs and symptoms indicating need for more acute intervention. . Patient verbalized understanding and all questions were answered. Marland Kitchen Health Maintenance issues including appropriate healthy diet, exercise, and smoking avoidance were discussed with patient. . See orders for this visit as documented in the electronic medical record.  Briscoe Deutscher, DO  Records requested if needed. Time spent: 25 minutes, of which >50% was spent in obtaining information about his symptoms, reviewing his previous labs, evaluations, and treatments, counseling him about his condition (please see the discussed topics above), and developing a plan to further investigate it; he had a number of questions which I addressed.

## 2019-03-25 ENCOUNTER — Encounter: Payer: Self-pay | Admitting: Family Medicine

## 2019-03-25 ENCOUNTER — Telehealth (INDEPENDENT_AMBULATORY_CARE_PROVIDER_SITE_OTHER): Payer: 59 | Admitting: Family Medicine

## 2019-03-25 VITALS — Ht 76.0 in | Wt 226.0 lb

## 2019-03-25 DIAGNOSIS — F331 Major depressive disorder, recurrent, moderate: Secondary | ICD-10-CM | POA: Diagnosis not present

## 2019-03-25 DIAGNOSIS — K2100 Gastro-esophageal reflux disease with esophagitis, without bleeding: Secondary | ICD-10-CM

## 2019-03-25 DIAGNOSIS — F411 Generalized anxiety disorder: Secondary | ICD-10-CM

## 2019-03-25 NOTE — Patient Instructions (Addendum)
The web site for Unemployment [email or web address]  [facility] [place]  Phone [phone] Address [address] [place]  Walk in 8-3 just bring ID  After hours number: [phone] Crisis number: [place] [facility]   If you live in central [place] and have never had contact with the mental health system,  we recommend starting by calling the [facility]:  [facility] [phone])  Michael Garcia is the publicly-funded LME-MCO (Local Management Entity-Managed Care Organization) serving central [place]. This organization has licensed clinicians on-call who can answer any questions you may have about getting treated for mental health concerns, and will help match you with appropriate care that you can afford.   Their call center is available 24 hours   Providers Offering Free Services  Organization Name Services Provided Insurance and Payment Info  [place]   [facility] [address], [place] [address] [phone] . Open Access . Outpatient Therapy . Assertive Scientist, research (medical)  . Medication Management . No Insurance . Sliding fee scale . Accepts Aetna, [place], [place], Medicaid, Medicare, [place] Health Choice, Self Pay, Tri-Care, [facility] of the [place], [place] [address] [phone] . Domestic violence shelter . Consumer credit counseling services . Employee assistance program . Group counseling . Individual & family counseling . Male batterers program . Victim services . Medicaid . Sliding fee scale . No insurance required  [facility]  [address] [place], [place] Garcia [address] [phone] . Trauma Informed Services . Outpatient Counseling . Individual, Couples, and Family Counseling . Peer Support Services (Group and Individual) . Victim Advocacy . Case Management . No insurance required . Sliding Fee . Free or Reduced  Cost . Accept some insurance plans  [facility] [place] [address] [place], [place] [address] [phone] . Wellness and recovery classes . Compeer program . Peer support services . Support groups . Free services  [facility]  Mobile Crisis Phone [phone] . Mobile crisis services . [place] [address] [place] [address] [phone] . Psycho-social Rehabilitation clubhouse . Individual and group therapy . Clinical assessments . Free services  [employer] on Mental Illness ([place]) [place] [phone] . Wellness classes . Support groups . Free services  Medicaid- Sliding Fee Scale- Payment Assistance   Organization Name Services Provided [place] [facility] [place], [place] [address] [phone] Ext: [phone] . Psychotherapy . Life coaching and guidance . Bodywork and movement therapy   . Sliding fee scale . Blue Cross [place], [place], [employer] Employees, Tri-Care  [place]. [place], [place] [address] [phone] Ext: 111  . Psychological evaluation . Individual, marital, and family counseling . Crisis intervention and conflict resolution  . Stress reduction and management . Career counseling . Parenting issues . Psycho-educational groups . Medicaid . Accepts Aetna, [place], [place], [place], [place], [employer], [employer], Self Pay, [employer], [place], [place]. [place], [place] [address] [phone] . Individual therapy . Dialectical behavioral therapy skills group . Wellness workshop . Medicaid . Self pay  [place] [address] [place]. [place], [place] [address] [phone] . 9 bed therapeutic residence for adults who have a primary diagnosis of mental illness and/or emotional disorder  . Medicaid . Self pay  [place] [address] [place], [place] [address] [phone]   . Individual therapy . Sliding fee scale . Medicare . Accepts most insurance plans  [facility] [address] [place], [place] [address] [phone] . Individual Christian counseling for women . Payment assistance available  [place] [address] [place] [address] [place], [place] [address] [phone] . Individual and family therapy . Community support team . Diagnostic assessment . Comprehensive clinical assessment . Medication management . Medicaid  [facility] [employer]. [place], [place] [address] [phone] . Mental evaluations . Individual and family therapy . Medicaid . No insurance required . Sliding fee scale . Accepts most insurance plans  [facility] [place], [place] [address] [phone] . Individual and family counseling . LGBTQ therapy . No insurance required . Some services free . Sliding fee scale . Medicaid . Accepts most insurance plans  Self Pay and Immunologist Name Services Provided [place]. [place], [place] [address] [phone] . Individual and family counseling . Accepts Aetna, [employer], [place], [place], [employer], [place], Self Pay, [employer] Counseling and [employer]  [address] [place], [place] [address] [phone] . Outpatient therapy . Comprehensive clinical assessments . Medication management . Community support team . Psycho-social rehabilitation     . Accepts Aetna, [place], [place], [employer], [place] Health Choice, [place]  [place], [place] [address] [place] [address] [address] [phone] . Individual Therapy . Psychosocial Rehabilitation . Crisis Management . Accepts Blue  Cross [place], [place], [place] Health Choice, Self Pay  [facility]  [address] [place] [place] [place],  [phone] Primary Care . Behavioral Health Consulting . Medication Management . Outpatient Therapy  Substance Abuse Services . Individual Therapy . Substance Abuse Intensive Outpatient (SAIOP) . Medication Assisted Treatment (specifically Suboxone) . Psychological Evaluations . Complex Clinical Needs Residential . Medicaid (for all services) . Accepts Blue Cross [place], [place], [place], [place], Garment/textile technologist (for primary care and medication management)    *Source: https://byrd-solis.org/

## 2019-03-27 ENCOUNTER — Encounter: Payer: Self-pay | Admitting: Family Medicine

## 2019-03-30 ENCOUNTER — Encounter: Payer: Self-pay | Admitting: Family Medicine

## 2019-03-30 NOTE — Telephone Encounter (Signed)
Called and spoke with patient. Generic letter for pt to be out of work x 3 months has been sent to him via Pharmacist, community. He will contact us if any additional information is needed. I also advised no medical information was provided on letter but can be provided if employer sends FMLA forms. Pt verbalized understanding.

## 2019-04-01 ENCOUNTER — Encounter: Payer: Self-pay | Admitting: Family Medicine

## 2019-04-02 ENCOUNTER — Other Ambulatory Visit: Payer: Self-pay

## 2019-04-02 MED ORDER — ESOMEPRAZOLE MAGNESIUM 40 MG PO CPDR: 40.0000 mg | DELAYED_RELEASE_CAPSULE | Freq: Every day | ORAL | 2 refills | Status: DC

## 2019-04-03 ENCOUNTER — Telehealth: Payer: Self-pay

## 2019-04-03 NOTE — Telephone Encounter (Signed)
Forms received.  

## 2019-04-08 NOTE — Telephone Encounter (Signed)
Forms have been completed and faxed to the [facility] and [facility].

## 2019-04-10 ENCOUNTER — Encounter: Payer: Self-pay | Admitting: Family Medicine

## 2019-04-16 ENCOUNTER — Other Ambulatory Visit: Payer: Self-pay

## 2019-04-16 MED ORDER — EMGALITY 120 MG/ML ~~LOC~~ SOAJ: 1.0000 | SUBCUTANEOUS | 4 refills | Status: DC

## 2019-04-20 DIAGNOSIS — Z0279 Encounter for issue of other medical certificate: Secondary | ICD-10-CM

## 2019-04-21 NOTE — Telephone Encounter (Signed)
Sent!

## 2019-04-27 ENCOUNTER — Encounter: Payer: Self-pay | Admitting: Family Medicine

## 2019-04-27 NOTE — Progress Notes (Signed)
Virtual Visit via Video   Due to the COVID-19 pandemic, this visit was completed with telemedicine (audio/video) technology to reduce patient and provider exposure as well as to preserve personal protective equipment.   I connected with Michael Garcia by a video enabled telemedicine application and verified that I am speaking with the correct person using two identifiers. Location patient: Home Location provider: [facility], Office Persons participating in the virtual visit: Jernard Reiber, Briscoe Deutscher, DO [name], CMA acting as scribe for Dr. Briscoe Deutscher.   I discussed the limitations of evaluation and management by telemedicine and the availability of in person appointments. The patient expressed understanding and agreed to proceed.  Care Team   Patient Care Team: Briscoe Deutscher, DO as PCP - General (Family Medicine) Magnus Sinning, MD as Consulting Physician (Physical Medicine and Rehabilitation) Alexis Frock, MD as Consulting Physician (Urology) Chauncey Mann, MD as Referring Physician (Psychiatry) Chauncey Mann, MD as Referring Physician (Psychiatry)  Subjective:   HPI: Patient is doing some better. He is seeing a therapist once a week. He is having some improvement. Will have have an appointment today and would like to talk about changing some of his medications. He feels like he is over medicated and has some gait issues.   Review of Systems  Constitutional: Negative for chills and fever.  HENT: Negative for hearing loss and tinnitus.   Eyes: Negative for blurred vision and double vision.  Respiratory: Negative for cough and hemoptysis.   Cardiovascular: Negative for chest pain, palpitations and leg swelling.  Gastrointestinal: Negative for heartburn and nausea.  Genitourinary: Negative for dysuria and urgency.  Musculoskeletal: Negative for myalgias and neck pain.  Neurological: Negative for dizziness and headaches.  Psychiatric/Behavioral:  Positive for depression. Negative for suicidal ideas.    Patient Active Problem List   Diagnosis Date Noted  . Other ejaculatory dysfunction 09/11/2018  . Stricture of anterior urethra in male 09/11/2018  . Rotator cuff tendinitis, right 08/25/2018  . Intradural mass ([place]) 04/22/2018  . Anemia 11/21/2017  . Benign non-nodular prostatic hyperplasia with lower urinary tract symptoms 11/21/2017  . Erectile dysfunction due to arterial insufficiency 11/21/2017  . Gastro-esophageal reflux disease with esophagitis 11/21/2017  . Lichen planus 38/18/2993  . Lipid disorder 11/21/2017  . Lumbar radicular pain 11/21/2017  . Migraine headache 11/21/2017  . Thyroid nodule 11/21/2017  . Glaucoma 11/21/2017  . Chronic myofascial pain 11/21/2017  . Peyronie's disease 11/12/2016  . Incomplete right bundle branch block 10/18/2016  . GAD (generalized anxiety disorder) 03/29/2016  . Insomnia, persistent 03/29/2016  . MDD (major depressive disorder), recurrent episode, moderate ([place]) 03/29/2016  . Nondependent alcohol abuse, in remission 03/29/2016  . Cervical spondylosis without myelopathy 04/29/2014  . Degenerative disc disease, cervical 04/29/2014  . Status post cervical spinal fusion 04/29/2014  . Cervical stenosis of spinal canal 11/12/2008    Social History   Tobacco Use  . Smoking status: Former Smoker    Types: Cigarettes    Quit date: 1994    Years since quitting: 26.4  . Smokeless tobacco: Never Used  Substance Use Topics  . Alcohol use: No    Frequency: Never    Current Outpatient Medications:  .  alfuzosin (UROXATRAL) 10 MG 24 hr tablet, Take 1 tablet (10 mg total) by mouth daily., Disp: 90 tablet, Rfl: 0 .  ALPRAZolam (XANAX) 1 MG tablet, , Disp: , Rfl: 2 .  cetirizine (ZYRTEC) 10 MG tablet, Take 1 tablet by mouth daily., Disp: , Rfl:  .  divalproex (DEPAKOTE) 500 MG DR tablet, 1 tablet daily for 3 weeks and then take 1 tablet twice daily, Disp: 60 tablet, Rfl: 3 .  EMGALITY  120 MG/ML SOAJ, Inject 1 each into the skin every 30 (thirty) days., Disp: 1 pen, Rfl: 4 .  esomeprazole (NEXIUM) 40 MG capsule, Take 1 capsule (40 mg total) by mouth daily., Disp: 90 capsule, Rfl: 2 .  FIBER PO, Take by mouth., Disp: , Rfl:  .  finasteride (PROSCAR) 5 MG tablet, Take 1 tablet (5 mg total) by mouth daily., Disp: , Rfl:  .  fluticasone (FLONASE) 50 MCG/ACT nasal spray, Place 2 sprays into both nostrils daily., Disp: 16 g, Rfl: 5 .  gabapentin (NEURONTIN) 600 MG tablet, Take 1 tablet (600 mg total) by mouth 3 (three) times daily., Disp: 270 tablet, Rfl: 3 .  lamoTRIgine (LAMICTAL) 150 MG tablet, Take 150 mg by mouth 2 (two) times daily., Disp: , Rfl:  .  lidocaine (LIDODERM) 5 %, USE AS DIRECTED ONCE DAILY EXTERNALLY, Disp: 30 patch, Rfl: 2 .  Melatonin 3 MG TABS, , Disp: , Rfl:  .  methocarbamol (ROBAXIN) 750 MG tablet, Take 1 tablet (750 mg total) by mouth 3 (three) times daily., Disp: 90 tablet, Rfl: 3 .  MULTIPLE VITAMIN PO, Take by mouth., Disp: , Rfl:  .  REXULTI 1 MG TABS, TK 1 T PO HS, Disp: , Rfl:  .  rizatriptan (MAXALT) 10 MG tablet, Take 1 tablet (10 mg total) by mouth as needed for migraine., Disp: 10 tablet, Rfl: 1 .  sildenafil (VIAGRA) 100 MG tablet, , Disp: , Rfl:  .  tacrolimus (PROTOPIC) 0.1 % ointment, APPLY TO AFFECTED AREAS UTD, Disp: , Rfl:  .  TRINTELLIX 20 MG TABS tablet, TK 1 T PO IN THE MORNING, Disp: , Rfl:  .  Lidocaine 5 % CREA, , Disp: , Rfl:  .  traZODone (DESYREL) 100 MG tablet, Take 1 tablet (100 mg total) by mouth at bedtime as needed for sleep., Disp: 30 tablet, Rfl: 2  Allergies  Allergen Reactions  . Bacitracin Itching and Rash  . Iodine Itching and Rash    IV Iodine   . Sulfamethoxazole-Trimethoprim Rash   Objective:   VITALS: Per patient if applicable, see vitals. GENERAL: Alert, appears well and in no acute distress. HEENT: Atraumatic, conjunctiva clear, no obvious abnormalities on inspection of external nose and ears. NECK:  Normal movements of the head and neck. CARDIOPULMONARY: No increased WOB. Speaking in clear sentences. I:E ratio WNL.  MS: Moves all visible extremities without noticeable abnormality. PSYCH: Pleasant and cooperative, well-groomed. Speech normal rate and rhythm. Affect is appropriate. Insight and judgement are appropriate. Attention is focused, linear, and appropriate.  NEURO: CN grossly intact. Oriented as arrived to appointment on time with no prompting. Moves both UE equally.  SKIN: No obvious lesions, wounds, erythema, or cyanosis noted on face or hands.  Depression screen [facility] 2/9 09/30/2018 05/05/2018 11/21/2017  Decreased Interest 1 1 0  Down, Depressed, Hopeless 1 1 1   PHQ - 2 Score 2 2 1   Altered sleeping 2 0 3  Tired, decreased energy 1 1 0  Change in appetite 2 1 0  Feeling bad or failure about yourself  0 1 0  Trouble concentrating 1 1 1   Moving slowly or fidgety/restless 0 0 0  Suicidal thoughts 0 0 0  PHQ-9 Score 8 6 5   Difficult doing work/chores Not difficult at all - -   Assessment and Plan:   Joneen Boers  was seen today for follow-up.  Diagnoses and all orders for this visit:  GAD (generalized anxiety disorder)  MDD (major depressive disorder), recurrent episode, moderate ([place])  Patient will continue with Psychiatry. Will make sure to provide work note stating that patient is improving and okay to work.   Marland Kitchen COVID-19 Education: The signs and symptoms of COVID-19 were discussed with the patient and how to seek care for testing if needed. The importance of social distancing was discussed today. . Reviewed expectations re: course of current medical issues. . Discussed self-management of symptoms. . Outlined signs and symptoms indicating need for more acute intervention. . Patient verbalized understanding and all questions were answered. Marland Kitchen Health Maintenance issues including appropriate healthy diet, exercise, and smoking avoidance were discussed with patient. . See orders  for this visit as documented in the electronic medical record.  Briscoe Deutscher, DO  Records requested if needed. Time spent: 25 minutes, of which >50% was spent in obtaining information about his symptoms, reviewing his previous labs, evaluations, and treatments, counseling him about his condition (please see the discussed topics above), and developing a plan to further investigate it; he had a number of questions which I addressed.

## 2019-04-28 ENCOUNTER — Ambulatory Visit (INDEPENDENT_AMBULATORY_CARE_PROVIDER_SITE_OTHER): Payer: 59 | Admitting: Family Medicine

## 2019-04-28 ENCOUNTER — Ambulatory Visit (INDEPENDENT_AMBULATORY_CARE_PROVIDER_SITE_OTHER): Payer: 59 | Admitting: Neurology

## 2019-04-28 ENCOUNTER — Other Ambulatory Visit: Payer: Self-pay

## 2019-04-28 ENCOUNTER — Encounter: Payer: Self-pay | Admitting: Neurology

## 2019-04-28 VITALS — Ht 76.0 in | Wt 226.0 lb

## 2019-04-28 DIAGNOSIS — M5416 Radiculopathy, lumbar region: Secondary | ICD-10-CM | POA: Diagnosis not present

## 2019-04-28 DIAGNOSIS — F411 Generalized anxiety disorder: Secondary | ICD-10-CM

## 2019-04-28 DIAGNOSIS — G43C1 Periodic headache syndromes in child or adult, intractable: Secondary | ICD-10-CM

## 2019-04-28 DIAGNOSIS — F331 Major depressive disorder, recurrent, moderate: Secondary | ICD-10-CM | POA: Diagnosis not present

## 2019-04-28 NOTE — Progress Notes (Signed)
     Virtual Visit via Telephone Note  I connected with Michael Garcia on 04/28/19 at  2:30 PM EDT by telephone and verified that I am speaking with the correct person using two identifiers.  Location: Patient: The patient is at home. Provider: Physician in office.   I discussed the limitations, risks, security and privacy concerns of performing an evaluation and management service by telephone and the availability of in person appointments. I also discussed with the patient that there may be a patient responsible charge related to this service. The patient expressed understanding and agreed to proceed.   History of Present Illness: Michael Garcia is a 63 year old right-handed white male with a history of intractable migraine headaches, he has pain in the left occipital distribution with discomfort in the left shoulder and neck.  He has some back pain and left leg discomfort following resection of a schwannoma affecting the conus medullaris.  He currently is on Emgality, he has had a 50% benefit with his headache.  He is on Lamictal as a mood stabilizer, and he was placed on Depakote as recommended through his psychologist.  The patient has worked up to 500 mg twice daily, he has noted significant benefit with his headaches, he has only had 2 headaches in the last month.  He however has developed a significant tremor of both upper extremities.  He has also noted some problems with gait instability, he has recently increased his gabapentin to 600 mg 3 times daily.  He believes that this has also helped his back pain and left leg pain significantly.  He unfortunately is not able to hike well because of the tremor, he has had occasional falls on the gabapentin.   Observations/Objective: The telephone evaluation reveals that the patient is alert and cooperative, he is a normal speech pattern, he is answering questions appropriately.  No dysarthria or aphasia is noted.  Assessment and Plan: 1.   Migraine headaches, left occipital  2.  Status post schwannoma resection, conus medullaris  3.  Chronic low back pain, left leg pain  4.  History of depression  The patient has developed significant tremors on the Depakote, we will reduce the dose to 500 mg at night, if his tremors continue, we will further reduce the medication.  If his ability to ambulate has not improved, we may have to reduce the gabapentin dose, the patient will contact me in about 2 weeks.  Fortunately, the Depakote and gabapentin have significant improved the headaches and the back and leg pain respectively.  Follow Up Instructions: Follow-up for his next scheduled visit in October 2020.   I discussed the assessment and treatment plan with the patient. The patient was provided an opportunity to ask questions and all were answered. The patient agreed with the plan and demonstrated an understanding of the instructions.   The patient was advised to call back or seek an in-person evaluation if the symptoms worsen or if the condition fails to improve as anticipated.  I provided 15 minutes of non-face-to-face time during this encounter.   Kathrynn Ducking, MD

## 2019-04-28 NOTE — Telephone Encounter (Signed)
[place] will refax another form to be completed

## 2019-05-05 ENCOUNTER — Encounter: Payer: Self-pay | Admitting: Family Medicine

## 2019-05-19 ENCOUNTER — Encounter: Payer: Self-pay | Admitting: Family Medicine

## 2019-05-20 ENCOUNTER — Encounter: Payer: Self-pay | Admitting: Family Medicine

## 2019-05-22 ENCOUNTER — Other Ambulatory Visit: Payer: Self-pay | Admitting: Family Medicine

## 2019-05-23 ENCOUNTER — Other Ambulatory Visit: Payer: Self-pay | Admitting: Family Medicine

## 2019-05-23 ENCOUNTER — Other Ambulatory Visit: Payer: Self-pay | Admitting: Neurology

## 2019-06-01 ENCOUNTER — Other Ambulatory Visit: Payer: Self-pay

## 2019-06-01 MED ORDER — RIZATRIPTAN BENZOATE 10 MG PO TABS: 10.0000 mg | ORAL_TABLET | ORAL | 1 refills | Status: DC | PRN

## 2019-08-06 ENCOUNTER — Telehealth: Payer: Self-pay

## 2019-08-06 NOTE — Telephone Encounter (Signed)
Pt has been scheduled for 08/19/2019 at 12 pm.

## 2019-08-06 NOTE — Telephone Encounter (Signed)
Left vm asking pt to call me back so we could schedule re visit.

## 2019-08-06 NOTE — Telephone Encounter (Signed)
-----   Message from Kathrynn Ducking, MD sent at 08/04/2019  9:04 AM EDT ----- Please call the patient and get a revisit set up sometime in the next several weeks.  Thank you.

## 2019-08-14 ENCOUNTER — Ambulatory Visit: Payer: 59 | Admitting: Neurology

## 2019-08-19 ENCOUNTER — Ambulatory Visit: Payer: Managed Care, Other (non HMO) | Admitting: Neurology

## 2019-09-13 NOTE — Progress Notes (Signed)
PATIENT: Michael Garcia. DOB: Sep 22, 1956  REASON FOR VISIT: follow up HISTORY FROM: patient  HISTORY OF PRESENT ILLNESS: Today 09/14/19  Michael Garcia is a 63 year old male with history of intractable migraine headache, and resection of a schwannoma affecting the conus medullaris.  He has complained of trouble with balance, tremor, and memory loss.  At one time, he was taking Depakote, gabapentin, and Emgality for headaches.  He has since stopped all 3 medications.  The tremor and issues with balance have improved, maybe 60%.  He does report that the tremor is worse at the end of the day, he is not able to do any handwriting by the end of the day.  He reports in the last week he had 2 near falls, but was able to catch himself.  This is pretty typical for him.  He is now complaining of overall muscle weakness, weakness in his arms and shoulders.  He says this is been going on for about 6 weeks.  He reports at the end of the day, he feels fatigued.  He denies any double vision or ptosis.  He is scheduled to have a follow-up in 6 weeks at [facility] with his spine surgeon.  He remains on chronic Lamictal.  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  He has been off Emgality for 2 months, he reports he may have 12-15 headaches a month.  He uses all of   his monthly Maxalt.  He continues to report trouble with memory, but feels this is stable.  He has been trying to get back into the gym, but his overall weakness affects his ability.  Of most concern, is his overall muscle weakness and shoulder weakness for the last several weeks.  The balance and tremor have improved with discontinuation of Depakote and gabapentin.  He was scheduled to see Dr. Jannifer Franklin in October, but he had to cancel his appointment due to not being able to leave work.  He presents today for follow-up unaccompanied.  HISTORY 04/28/2019 Dr. Jannifer Franklin: Michael Garcia is a 63 year old right-handed white male with a history of intractable  migraine headaches, he has pain in the left occipital distribution with discomfort in the left shoulder and neck.  He has some back pain and left leg discomfort following resection of a schwannoma affecting the conus medullaris.  He currently is on Emgality, he has had a 50% benefit with his headache.  He is on Lamictal as a mood stabilizer, and he was placed on Depakote as recommended through his psychologist.  The patient has worked up to 500 mg twice daily, he has noted significant benefit with his headaches, he has only had 2 headaches in the last month.  He however has developed a significant tremor of both upper extremities.  He has also noted some problems with gait instability, he has recently increased his gabapentin to 600 mg 3 times daily.  He believes that this has also helped his back pain and left leg pain significantly.  He unfortunately is not able to hike well because of the tremor, he has had occasional falls on the gabapentin.  REVIEW OF SYSTEMS: Out of a complete 14 system review of symptoms, the patient complains only of the following symptoms, and all other reviewed systems are negative.  Memory loss, headache, weakness, tremors  ALLERGIES: Allergies  Allergen Reactions  . Bacitracin Itching and Rash  . Iodine Itching and Rash    IV Iodine   . Sulfamethoxazole-Trimethoprim Rash  HOME MEDICATIONS: Outpatient Medications Prior to Visit  Medication Sig Dispense Refill  . alfuzosin (UROXATRAL) 10 MG 24 hr tablet TAKE 1 TABLET(10 MG) BY MOUTH DAILY 90 tablet 0  . ALPRAZolam (XANAX) 1 MG tablet Take 1 mg by mouth. Take 1/2 tablet twice daily and 2 tablets at bedtime.  2  . buPROPion (WELLBUTRIN XL) 300 MG 24 hr tablet Take 300 mg by mouth daily.    . cetirizine (ZYRTEC) 10 MG tablet Take 1 tablet by mouth daily.    Marland Kitchen esomeprazole (NEXIUM) 40 MG capsule Take 1 capsule (40 mg total) by mouth daily. 90 capsule 2  . FIBER PO Take by mouth.    . fluticasone (FLONASE) 50 MCG/ACT  nasal spray INSTILL 2 SPRAYS IN EACH NOSTRIL EVERY DAY 16 g 5  . lamoTRIgine (LAMICTAL) 150 MG tablet Take 150 mg by mouth 2 (two) times daily.    Marland Kitchen lidocaine (LIDODERM) 5 % USE AS DIRECTED ONCE DAILY EXTERNALLY 30 patch 2  . Lidocaine 5 % CREA     . MULTIPLE VITAMIN PO Take by mouth.    . rizatriptan (MAXALT) 10 MG tablet Take 1 tablet (10 mg total) by mouth as needed for migraine. 10 tablet 1  . tacrolimus (PROTOPIC) 0.1 % ointment APPLY TO AFFECTED AREAS UTD    . divalproex (DEPAKOTE) 500 MG DR tablet 1 TABLET BY MOUTH TWICE DAILY (Patient not taking: Reported on 09/14/2019) 180 tablet 2  . EMGALITY 120 MG/ML SOAJ Inject 1 each into the skin every 30 (thirty) days. (Patient not taking: Reported on 09/14/2019) 1 pen 4  . finasteride (PROSCAR) 5 MG tablet Take 1 tablet (5 mg total) by mouth daily. (Patient not taking: Reported on 09/14/2019)    . gabapentin (NEURONTIN) 600 MG tablet Take 1 tablet (600 mg total) by mouth 3 (three) times daily. (Patient not taking: Reported on 09/14/2019) 270 tablet 3  . Melatonin 3 MG TABS     . methocarbamol (ROBAXIN) 750 MG tablet Take 1 tablet (750 mg total) by mouth 3 (three) times daily. (Patient not taking: Reported on 09/14/2019) 90 tablet 3  . REXULTI 1 MG TABS TK 1 T PO HS    . sildenafil (VIAGRA) 100 MG tablet     . traZODone (DESYREL) 100 MG tablet Take 1 tablet (100 mg total) by mouth at bedtime as needed for sleep. 30 tablet 2  . TRINTELLIX 20 MG TABS tablet TK 1 T PO IN THE MORNING     No facility-administered medications prior to visit.     PAST MEDICAL HISTORY: Past Medical History:  Diagnosis Date  . Alcoholism ([place])   . Anemia 11/21/2017  . Anxiety   . Arthritis   . Benign non-nodular prostatic hyperplasia with lower urinary tract symptoms 11/21/2017  . Bilateral arm pain 04/29/2014  . Cervical spondylosis without myelopathy 04/29/2014  . Cervical stenosis of spinal canal 11/12/2008  . Chronic headaches   . Chronic myofascial pain 11/21/2017   . Constipation 11/21/2017  . Degenerative disc disease, cervical 04/29/2014  . Depression   . Erectile disorder, acquired, generalized, severe 11/21/2017  . GAD (generalized anxiety disorder) 03/29/2016  . Gastro-esophageal reflux disease with esophagitis 11/21/2017  . Glaucoma 11/21/2017  . Incomplete right bundle branch block 10/18/2016  . Insomnia, persistent 03/29/2016  . Lichen planus, penis 11/21/2017  . Lipid disorder 11/21/2017  . Lumbar radicular pain 11/21/2017  . Nondependent alcohol abuse, in remission 03/29/2016  . Nonintractable migraine, unspecified migraine type 11/21/2017  . Peyronie disease 11/12/2016  .  Status post cervical spinal fusion 04/29/2014  . Thyroid nodule 11/21/2017    PAST SURGICAL HISTORY: Past Surgical History:  Procedure Laterality Date  . CERVICAL SPINE SURGERY  2010   c6-c7 fusion  . HERNIA REPAIR      FAMILY HISTORY: Family History  Problem Relation Age of Onset  . Heart disease Mother   . Irritable bowel syndrome Mother   . Diabetes Sister     SOCIAL HISTORY: Social History   Socioeconomic History  . Marital status: Married    Spouse name: Not on file  . Number of children: Not on file  . Years of education: Not on file  . Highest education level: Not on file  Occupational History  . Occupation: Scientist, physiological: [employer]  . Financial resource strain: Not on file  . Food insecurity    Worry: Not on file    Inability: Not on file  . Transportation needs    Medical: Not on file    Non-medical: Not on file  Tobacco Use  . Smoking status: Former Smoker    Types: Cigarettes    Quit date: 1994    Years since quitting: 26.8  . Smokeless tobacco: Never Used  Substance and Sexual Activity  . Alcohol use: No    Frequency: Never  . Drug use: No  . Sexual activity: Not on file  Lifestyle  . Physical activity    Days per week: Not on file    Minutes per session: Not on file  . Stress: Not on file  Relationships   . Social Herbalist on phone: Not on file    Gets together: Not on file    Attends religious service: Not on file    Active member of club or organization: Not on file    Attends meetings of clubs or organizations: Not on file    Relationship status: Not on file  . Intimate partner violence    Fear of current or ex partner: Not on file    Emotionally abused: Not on file    Physically abused: Not on file    Forced sexual activity: Not on file  Other Topics Concern  . Not on file  Social History Narrative   Patient recently moved to this area with his wife and daughter.     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     Though he has many medical issues, he is a generally healthy appearing gentleman.     He does not exercise regularly but does try to exercise as much as possible.     No high risk behaviors.     He was previously traveling to [facility] for treatments but will be transitioning his care to [place].   Caffeine use:    PHYSICAL EXAM  Vitals:   09/14/19 0824  BP: 121/74  Pulse: 75  Temp: (!) 97.5 F (36.4 C)  TempSrc: Oral  Weight: 235 lb (106.6 kg)  Height: 6\' 4"  (1.93 m)   Body mass index is 28.61 kg/m.  Generalized: Well developed, in no acute distress, well-appearing  Neurological examination  Mentation: Alert oriented to time, place, history taking. Follows all commands speech and language fluent Cranial nerve II-XII: Pupils were equal round reactive to light. Extraocular movements were full, visual field were full on confrontational test. Facial sensation and strength were normal.  Head turning and shoulder shrug were normal and symmetric.  Motor: The motor testing  reveals 5 over 5 strength of all 4 extremities. Good symmetric motor tone is noted throughout. With arms abducted for 1 minute, no giveaway weakness noted. Full abduction of both shoulders, is mildly pain provoking in the left shoulder.  Sensory: Sensory testing is intact to soft touch on  all 4 extremities. No evidence of extinction is noted.  Coordination: Cerebellar testing reveals good finger-nose-finger and heel-to-shin bilaterally.  Mild intention tremor bilaterally with finger-nose-finger. Gait and station: Gait is normal. Tandem gait is moderately unsteady Reflexes: Deep tendon reflexes are symmetric and normal bilaterally.   DIAGNOSTIC DATA (LABS, IMAGING, TESTING) - I reviewed patient records, labs, notes, testing and imaging myself where available.  Lab Results  Component Value Date   WBC 6.3 05/05/2018   HGB 13.7 05/05/2018   HCT 39.2 05/05/2018   MCV 94.0 05/05/2018   PLT 210.0 05/05/2018      Component Value Date/Time   NA 140 05/05/2018 0720   NA 142 04/18/2018   K 4.0 05/05/2018 0720   CL 103 05/05/2018 0720   CO2 30 05/05/2018 0720   GLUCOSE 100 (H) 05/05/2018 0720   BUN 11 05/05/2018 0720   BUN 9 04/18/2018   CREATININE 0.93 05/05/2018 0720   CALCIUM 9.3 05/05/2018 0720   PROT 6.9 05/05/2018 0720   ALBUMIN 4.6 05/05/2018 0720   AST 14 05/05/2018 0720   ALT 14 05/05/2018 0720   ALKPHOS 54 05/05/2018 0720   BILITOT 0.5 05/05/2018 0720   Lab Results  Component Value Date   CHOL 164 05/05/2018   HDL 55.40 05/05/2018   LDLCALC 91 05/05/2018   TRIG 88.0 05/05/2018   CHOLHDL 3 05/05/2018   Lab Results  Component Value Date   HGBA1C 5.3 10/23/2017   Lab Results  Component Value Date   VITAMINB12 277 04/18/2018   Lab Results  Component Value Date   TSH 2.01 05/05/2018      ASSESSMENT AND PLAN 63 y.o. year old male  has a past medical history of Alcoholism ([place]), Anemia (11/21/2017), Anxiety, Arthritis, Benign non-nodular prostatic hyperplasia with lower urinary tract symptoms (11/21/2017), Bilateral arm pain (04/29/2014), Cervical spondylosis without myelopathy (04/29/2014), Cervical stenosis of spinal canal (11/12/2008), Chronic headaches, Chronic myofascial pain (11/21/2017), Constipation (11/21/2017), Degenerative disc disease, cervical  (04/29/2014), Depression, Erectile disorder, acquired, generalized, severe (11/21/2017), GAD (generalized anxiety disorder) (03/29/2016), Gastro-esophageal reflux disease with esophagitis (11/21/2017), Glaucoma (11/21/2017), Incomplete right bundle branch block (10/18/2016), Insomnia, persistent ([id number]), Lichen planus, penis (11/21/2017), Lipid disorder (11/21/2017), Lumbar radicular pain (11/21/2017), Nondependent alcohol abuse, in remission (03/29/2016), Nonintractable migraine, unspecified migraine type (11/21/2017), Peyronie disease (11/12/2016), Status post cervical spinal fusion (04/29/2014), and Thyroid nodule (11/21/2017). here with:  1.  Chronic migraine headache 2.  Tremor 3.  Gait abnormality 4.  Muscle weakness 5.  Schwannoma resection, conus medullaris   Since last seen, he has discontinued Emgality, Depakote, and gabapentin.  His headaches have increased, he is now having 12-15 headaches a month.  He is using all of his Maxalt.  We discussed that CGRP is likely not contributing to his gait or tremor symptoms.  He would like to switch to Aimovig, because he has a coupon card.  He will start Aimovig 140 mg monthly injection, he will continue Maxalt as needed.  His tremor and gait/balance issues have improved nearly 60% with the discontinuation of Depakote and gabapentin.  Today, he is most concerned with his overall muscle weakness, muscle fatigue with exertion, and shoulder weakness/stiffness for the last several weeks.  I will order lab  work for evaluation for such concerns including, CBC, CMP, CK, TSH, CRP, sed rate, acetylcholine (binding, blocking, modulating), along with a Lamictal level.  On exam, he does not have any muscle weakness or giveaway weakness, but subjectively complains of such, especially at the end of the day. He specifically complains of shoulder weakness.  He is scheduled to follow-up with his neurosurgeon at [facility] in a few weeks following Schwannoma resection, conus medullaris.   I would like for him to follow-up in 2-3 months with Dr. Jannifer Franklin.   I spent 25 minutes with the patient. 50% of this time was spent discussing his plan of care.  Butler Denmark, AGNP-C, DNP 09/14/2019, 8:39 AM [facility] [address], [place], [place] [address] [phone]

## 2019-09-14 ENCOUNTER — Encounter: Payer: Self-pay | Admitting: Neurology

## 2019-09-14 ENCOUNTER — Ambulatory Visit (INDEPENDENT_AMBULATORY_CARE_PROVIDER_SITE_OTHER): Payer: 59 | Admitting: Neurology

## 2019-09-14 ENCOUNTER — Other Ambulatory Visit: Payer: Self-pay

## 2019-09-14 VITALS — BP 121/74 | HR 75 | Temp 97.5°F | Ht 76.0 in | Wt 235.0 lb

## 2019-09-14 DIAGNOSIS — R251 Tremor, unspecified: Secondary | ICD-10-CM

## 2019-09-14 DIAGNOSIS — R269 Unspecified abnormalities of gait and mobility: Secondary | ICD-10-CM

## 2019-09-14 DIAGNOSIS — G43C1 Periodic headache syndromes in child or adult, intractable: Secondary | ICD-10-CM

## 2019-09-14 DIAGNOSIS — M6281 Muscle weakness (generalized): Secondary | ICD-10-CM | POA: Diagnosis not present

## 2019-09-14 MED ORDER — RIZATRIPTAN BENZOATE 10 MG PO TABS: 10.0000 mg | ORAL_TABLET | ORAL | 3 refills | Status: DC | PRN

## 2019-09-14 MED ORDER — AIMOVIG 140 MG/ML ~~LOC~~ SOAJ: 140.0000 mg | SUBCUTANEOUS | 5 refills | Status: DC

## 2019-09-14 NOTE — Patient Instructions (Signed)
I will order lab work today. Please keep appointment with Doctor at [facility] for spine follow-up. Start Aimovig for headache, keep Maxalt. Return in 2-3 months with Dr. Jannifer Franklin.

## 2019-09-15 NOTE — Progress Notes (Signed)
I have read the note, and I agree with the clinical assessment and plan.  Charles K Willis   

## 2019-09-21 ENCOUNTER — Telehealth: Payer: Self-pay

## 2019-09-21 NOTE — Telephone Encounter (Signed)
Received a instant approval on Aimovig 140 mg. Key: [place] Case ID: [id number]  Aimovig has been approved through 09-20-2020. I will fax a copy of the approval letter once I receive it. I will update the patient via mychart.

## 2019-09-22 ENCOUNTER — Telehealth: Payer: Self-pay | Admitting: *Deleted

## 2019-09-22 LAB — COMPREHENSIVE METABOLIC PANEL
ALT: 11 IU/L (ref 0–44)
AST: 10 IU/L (ref 0–40)
Albumin/Globulin Ratio: 1.8 (ref 1.2–2.2)
Albumin: 5 g/dL — ABNORMAL HIGH (ref 3.8–4.8)
Alkaline Phosphatase: 78 IU/L (ref 39–117)
BUN/Creatinine Ratio: 7 — ABNORMAL LOW (ref 10–24)
BUN: 8 mg/dL (ref 8–27)
Bilirubin Total: 0.4 mg/dL (ref 0.0–1.2)
CO2: 28 mmol/L (ref 20–29)
Calcium: 9.9 mg/dL (ref 8.6–10.2)
Chloride: 99 mmol/L (ref 96–106)
Creatinine, Ser: 1.11 mg/dL (ref 0.76–1.27)
GFR calc Af Amer: 82 mL/min/{1.73_m2} (ref >59)
GFR calc non Af Amer: 71 mL/min/{1.73_m2} (ref >59)
Globulin, Total: 2.8 g/dL (ref 1.5–4.5)
Glucose: 108 mg/dL — ABNORMAL HIGH (ref 65–99)
Potassium: 4.8 mmol/L (ref 3.5–5.2)
Sodium: 138 mmol/L (ref 134–144)
Total Protein: 7.8 g/dL (ref 6.0–8.5)

## 2019-09-22 LAB — CK: Total CK: 64 U/L (ref 41–331)

## 2019-09-22 LAB — ACETYLCHOLINE RECEPTOR, MODULATING: Acetylcholine Modulat Ab: <12 % (ref 0–20)

## 2019-09-22 LAB — ACETYLCHOLINE RECEPTOR, BLOCKING: Acetylchol Block Ab: 21 % (ref 0–25)

## 2019-09-22 LAB — CBC WITH DIFFERENTIAL/PLATELET
Basophils Absolute: 0.1 10*3/uL (ref 0.0–0.2)
Basos: 1 %
EOS (ABSOLUTE): 0.1 10*3/uL (ref 0.0–0.4)
Eos: 2 %
Hematocrit: 44.2 % (ref 37.5–51.0)
Hemoglobin: 15 g/dL (ref 13.0–17.7)
Immature Grans (Abs): 0 10*3/uL (ref 0.0–0.1)
Immature Granulocytes: 0 %
Lymphocytes Absolute: 1.6 10*3/uL (ref 0.7–3.1)
Lymphs: 26 %
MCH: 31.8 pg (ref 26.6–33.0)
MCHC: 33.9 g/dL (ref 31.5–35.7)
MCV: 94 fL (ref 79–97)
Monocytes Absolute: 0.4 10*3/uL (ref 0.1–0.9)
Monocytes: 7 %
Neutrophils Absolute: 3.7 10*3/uL (ref 1.4–7.0)
Neutrophils: 64 %
Platelets: 234 10*3/uL (ref 150–450)
RBC: 4.71 x10E6/uL (ref 4.14–5.80)
RDW: 12.8 % (ref 11.6–15.4)
WBC: 5.9 10*3/uL (ref 3.4–10.8)

## 2019-09-22 LAB — ACETYLCHOLINE RECEPTOR, BINDING: AChR Binding Ab, Serum: 0.04 nmol/L (ref 0.00–0.24)

## 2019-09-22 LAB — LAMOTRIGINE LEVEL: Lamotrigine Lvl: 5.1 ug/mL (ref 2.0–20.0)

## 2019-09-22 LAB — SEDIMENTATION RATE: Sed Rate: 2 mm/hr (ref 0–30)

## 2019-09-22 LAB — TSH: TSH: 3.05 u[IU]/mL (ref 0.450–4.500)

## 2019-09-22 LAB — C-REACTIVE PROTEIN: CRP: 1 mg/L (ref 0–10)

## 2019-09-22 NOTE — Telephone Encounter (Signed)
Spoke with patient and informed him that the extensive lab work up was unremarkable and within normal limits. He asked if she explained what caused his issues. I reviewed her note and advised him that the labs ruled out some possible causes. He will FU with Dr Jannifer Franklin in Jan. I advised he call for any concerns before his follow up. Patient verbalized understanding, appreciation.

## 2019-11-25 ENCOUNTER — Other Ambulatory Visit: Payer: Self-pay | Admitting: Family Medicine

## 2019-11-25 ENCOUNTER — Other Ambulatory Visit: Payer: Self-pay

## 2019-12-02 ENCOUNTER — Encounter: Payer: Self-pay | Admitting: Neurology

## 2019-12-02 ENCOUNTER — Other Ambulatory Visit: Payer: Self-pay

## 2019-12-02 ENCOUNTER — Ambulatory Visit (INDEPENDENT_AMBULATORY_CARE_PROVIDER_SITE_OTHER): Payer: 59 | Admitting: Neurology

## 2019-12-02 VITALS — BP 128/77 | HR 73 | Temp 97.2°F | Ht 76.0 in | Wt 232.0 lb

## 2019-12-02 DIAGNOSIS — G43C1 Periodic headache syndromes in child or adult, intractable: Secondary | ICD-10-CM | POA: Diagnosis not present

## 2019-12-02 MED ORDER — EMGALITY 120 MG/ML ~~LOC~~ SOAJ: 120.0000 mg | SUBCUTANEOUS | 5 refills | Status: DC

## 2019-12-02 MED ORDER — RIZATRIPTAN BENZOATE 10 MG PO TABS: 10.0000 mg | ORAL_TABLET | ORAL | 5 refills | Status: DC | PRN

## 2019-12-02 NOTE — Progress Notes (Signed)
Reason for visit: Migraine headache  Michael Garcia. is an 64 y.o. male  History of present illness:  Michael Garcia is a 64 year old right-handed white male with a history of migraine headaches.  He also has a history of a schwannoma resection from the conus medullaris, he is followed through [facility] for this.  He has migraine headaches mainly in the left occipital distribution, the combination of Emgality, Depakote, and gabapentin significantly reduced the headache frequency, he was only having about 2 headaches a month on this medication regimen.  However, the patient developed significant gait instability on the gabapentin and severe tremors of the upper extremities on Depakote.  He was forced to come off these medications and subsequently his headaches have worsened.  He believes that [employer] worked better [employer] that he is on currently.  He is now having 8 or 9 headache days a month, he may miss work on occasion because of this.  He is having ongoing problems with left leg discomfort secondary to the schwannoma residual.  He will be having MRI of the lumbar spine in the next month.  He has not had any further falls, his handwriting has gotten much better.  He returns for an evaluation.  Maxalt is effective in controlling the headache if he takes it early.  Past Medical History:  Diagnosis Date  . Alcoholism ([place])   . Anemia 11/21/2017  . Anxiety   . Arthritis   . Benign non-nodular prostatic hyperplasia with lower urinary tract symptoms 11/21/2017  . Bilateral arm pain 04/29/2014  . Cervical spondylosis without myelopathy 04/29/2014  . Cervical stenosis of spinal canal 11/12/2008  . Chronic headaches   . Chronic myofascial pain 11/21/2017  . Constipation 11/21/2017  . Degenerative disc disease, cervical 04/29/2014  . Depression   . Erectile disorder, acquired, generalized, severe 11/21/2017  . GAD (generalized anxiety disorder) 03/29/2016  . Gastro-esophageal reflux disease  with esophagitis 11/21/2017  . Glaucoma 11/21/2017  . Incomplete right bundle branch block 10/18/2016  . Insomnia, persistent 03/29/2016  . Lichen planus, penis 11/21/2017  . Lipid disorder 11/21/2017  . Lumbar radicular pain 11/21/2017  . Nondependent alcohol abuse, in remission 03/29/2016  . Nonintractable migraine, unspecified migraine type 11/21/2017  . Peyronie disease 11/12/2016  . Status post cervical spinal fusion 04/29/2014  . Thyroid nodule 11/21/2017    Past Surgical History:  Procedure Laterality Date  . CERVICAL SPINE SURGERY  2010   c6-c7 fusion  . HERNIA REPAIR      Family History  Problem Relation Age of Onset  . Heart disease Mother   . Irritable bowel syndrome Mother   . Diabetes Sister     Social history:  reports that he quit smoking about 27 years ago. His smoking use included cigarettes. He has never used smokeless tobacco. He reports that he does not drink alcohol or use drugs.    Allergies  Allergen Reactions  . Bacitracin Itching and Rash  . Iodine Itching and Rash    IV Iodine   . Sulfamethoxazole-Trimethoprim Rash    Medications:  Prior to Admission medications   Medication Sig Start Date End Date Taking? Authorizing Provider  alfuzosin (UROXATRAL) 10 MG 24 hr tablet TAKE 1 TABLET(10 MG) BY MOUTH DAILY 05/25/19  Yes Briscoe Deutscher, DO  ALPRAZolam Duanne Moron) 1 MG tablet Take 1 mg by mouth. Take 1/2 tablet twice daily and 2 tablets at bedtime. 05/06/18  Yes [provider]  buPROPion (WELLBUTRIN XL) 300 MG 24 hr tablet  Take 300 mg by mouth daily. 07/22/19  Yes [provider]  cetirizine (ZYRTEC) 10 MG tablet Take 1 tablet by mouth daily.   Yes [provider]  Erenumab-aooe (AIMOVIG) 140 MG/ML SOAJ Inject 140 mg into the skin every 30 (thirty) days. 09/14/19  Yes Suzzanne Cloud, NP  esomeprazole (NEXIUM) 40 MG capsule TAKE 1 CAPSULE(40 MG) BY MOUTH DAILY 11/25/19  Yes Leamon Arnt, MD  FIBER PO Take by mouth.   Yes [provider]  fluticasone (FLONASE) 50 MCG/ACT nasal spray INSTILL 2 SPRAYS IN EACH NOSTRIL EVERY DAY 05/22/19  Yes Briscoe Deutscher, DO  lamoTRIgine (LAMICTAL) 150 MG tablet Take 150 mg by mouth 2 (two) times daily.   Yes [provider]  lidocaine (LIDODERM) 5 % USE AS DIRECTED ONCE DAILY EXTERNALLY 01/15/18  Yes Briscoe Deutscher, DO  Lidocaine 5 % CREA    Yes [provider]  MULTIPLE VITAMIN PO Take by mouth.   Yes [provider]  rizatriptan (MAXALT) 10 MG tablet Take 1 tablet (10 mg total) by mouth as needed for migraine. 09/14/19  Yes Suzzanne Cloud, NP  traZODone (DESYREL) 100 MG tablet Take 1 tablet (100 mg total) by mouth at bedtime as needed for sleep. 01/15/18 02/14/19  Briscoe Deutscher, DO    ROS:  Out of a complete 14 system review of symptoms, the patient complains only of the following symptoms, and all other reviewed systems are negative.  Headache Back pain, left leg pain  Blood pressure 128/77, pulse 73, temperature (!) 97.2 F (36.2 C), height 6\' 4"  (1.93 m), weight 232 lb (105.2 kg).  Physical Exam  General: The patient is alert and cooperative at the time of the examination.  Skin: No significant peripheral edema is noted.   Neurologic Exam  Mental status: The patient is alert and oriented x 3 at the time of the examination. The patient has apparent normal recent and remote memory, with an apparently normal attention span and concentration ability.   Cranial nerves: Facial symmetry is present. Speech is normal, no aphasia or dysarthria is noted. Extraocular movements are full. Visual fields are full.  Motor: The patient has good strength in all 4 extremities.  Sensory examination: Soft touch sensation is symmetric on the face and arms, decreased soft touch sensation on the left leg as compared to the right.  Coordination: The patient has good finger-nose-finger and heel-to-shin bilaterally.  A minimal tremor is noted with finger-nose-finger  bilaterally.  Gait and station: The patient has a normal gait. Tandem gait is normal. Romberg is negative. No drift is seen.  Reflexes: Deep tendon reflexes are symmetric.   Assessment/Plan:  1.  Migraine headache  2.  History of schwannoma, left leg discomfort  The patient was forced to come off of the gabapentin and Depakote which seemed to help the headache.  He has a significant history of depression, beta-blockers cannot be used for migraine.  We may consider addition of verapamil in the future if a switch to Emgality does not elicit significant improvement in his headache frequency.  A prescription was given for Emgality and for Maxalt, he will follow-up here in 6 months.  Jill Alexanders MD 12/02/2019 8:24 AM  [facility] [address] [place], [place] [address]  Phone [phone] Fax [phone]

## 2019-12-07 ENCOUNTER — Telehealth: Payer: Self-pay

## 2019-12-07 NOTE — Telephone Encounter (Signed)
Pending approval for Emgality. Key: [id number]. Rx #: [id number]. [place] Case ID: [id number] completed on CMM. I will update once a decision has been made

## 2019-12-08 NOTE — Telephone Encounter (Signed)
Emgality has been approved through 12-06-2020. Patient will be notified via mychart message.

## 2019-12-21 ENCOUNTER — Other Ambulatory Visit: Payer: Self-pay | Admitting: Family Medicine

## 2019-12-21 NOTE — Telephone Encounter (Signed)
Please review

## 2020-03-03 LAB — HM COLONOSCOPY

## 2020-04-02 ENCOUNTER — Other Ambulatory Visit: Payer: Self-pay | Admitting: Neurology

## 2020-04-10 IMAGING — MR MR LUMBAR SPINE W/O CM
4 of 5 series · 26 of 48 positions shown · non-contrast
Comparison: None.

CLINICAL DATA: Low back pain with bilateral buttock pain. Worse
with sneezing.

EXAM:
MRI LUMBAR SPINE WITHOUT CONTRAST
TECHNIQUE: Multiplanar, multisequence MR imaging of the lumbar spine was
performed. No intravenous contrast was administered.

[Series 3: T2 · sagittal · 4.0mm · 0.81mm/px · 6 of 15 slices shown (1 of 2)]
[im 1/15]
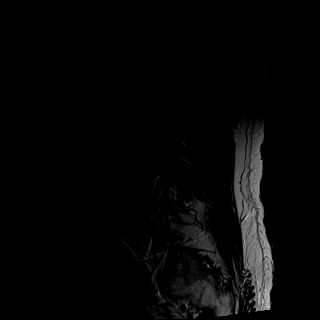
[im 3/15]
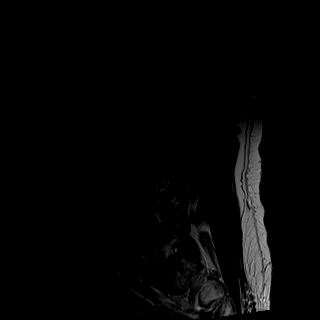
[im 6/15]
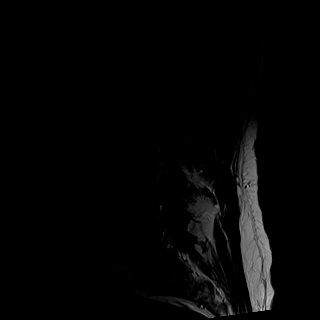
[im 9/15]
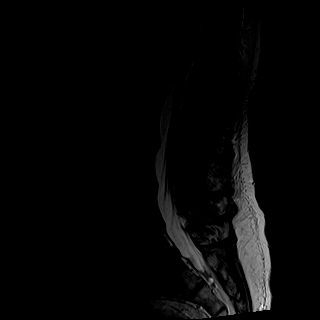
[im 12/15]
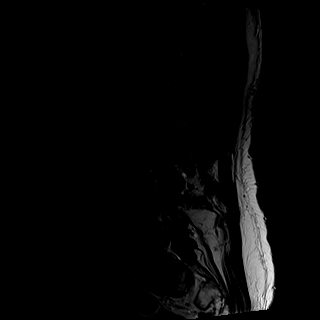
[im 15/15]
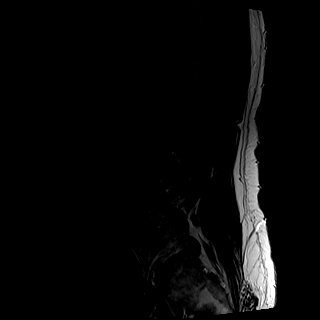

[Series 4: T1 · sagittal · 4.0mm · 0.41mm/px · 5 of 15 slices shown (1 of 2)]
[im 1/15]
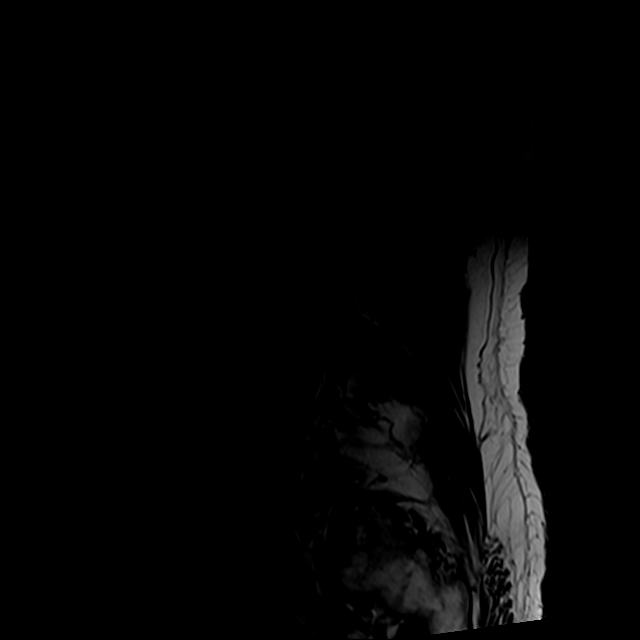
[im 4/15]
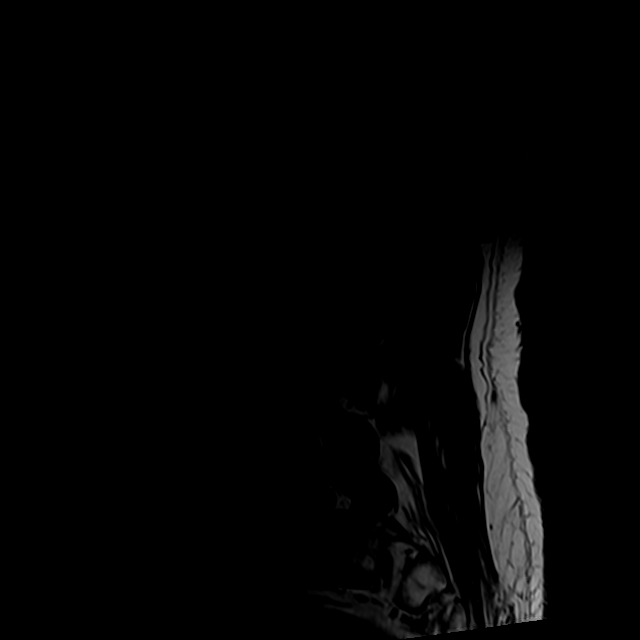
[im 8/15]
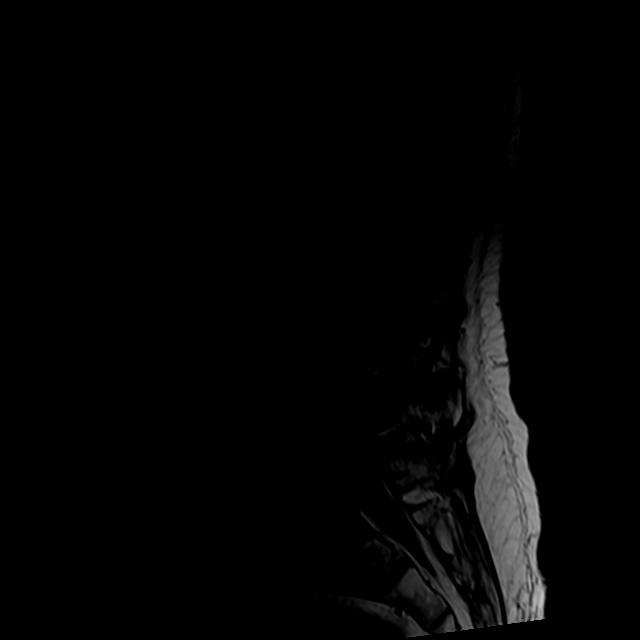
[im 11/15]
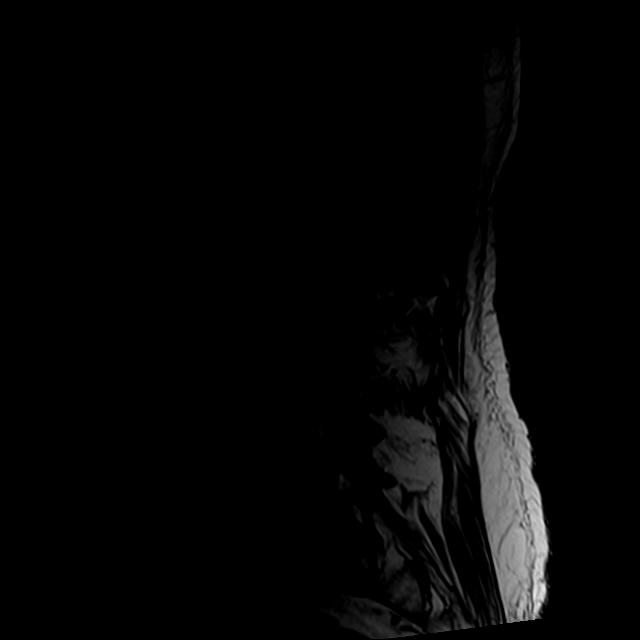
[im 15/15]
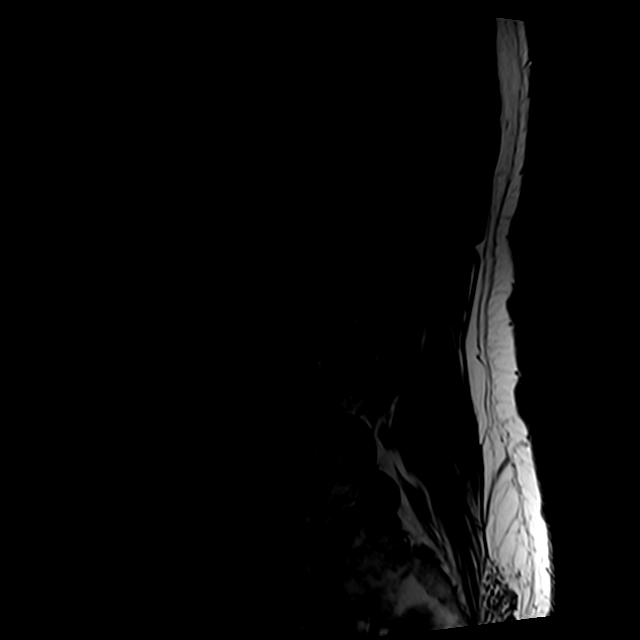

[Series 6: T2 · axial · 4.0mm · 0.78mm/px · z∈[+8,+275]mm · 10 of 49 slices shown (2 of 2)]
[im 4/49]
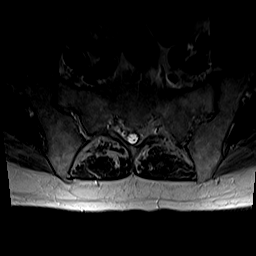
[im 7/49]
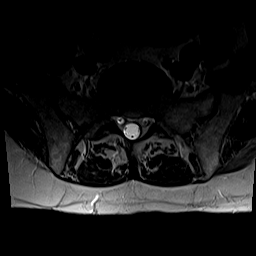
[im 10/49]
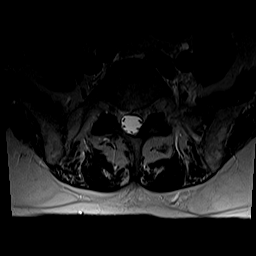
[im 17/49]
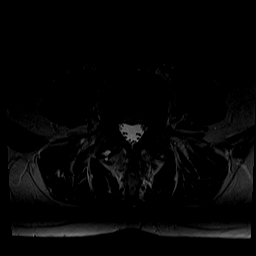
[im 23/49]
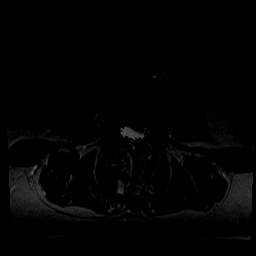
[im 26/49]
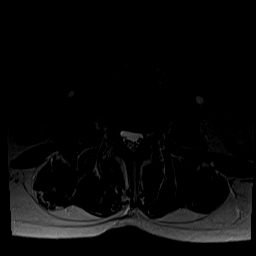
[im 29/49]
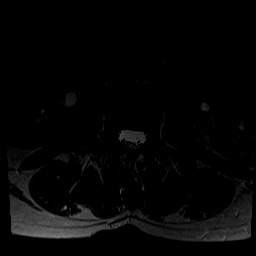
[im 36/49]
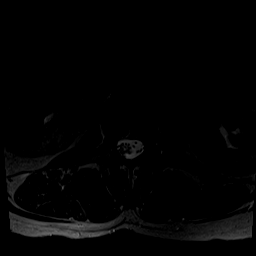
[im 42/49]
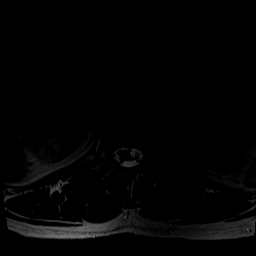
[im 49/49]
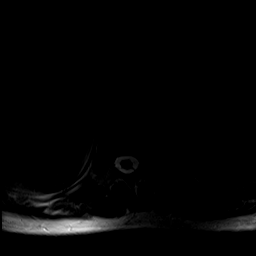

[Series 7: T1 · axial · 4.0mm · 0.39mm/px · z∈[+8,+240]mm · 5 of 49 slices shown (2 of 2)]
[im 4/49]
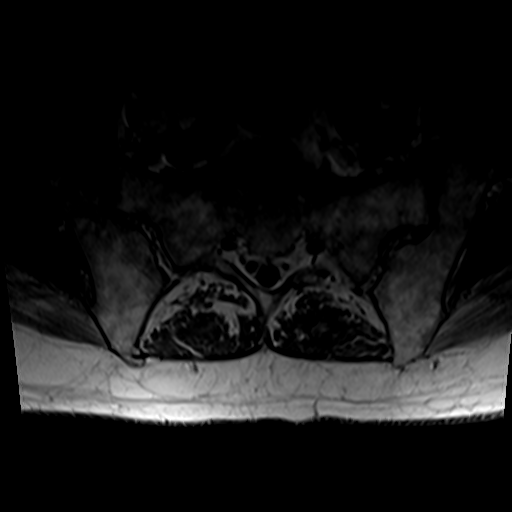
[im 7/49]
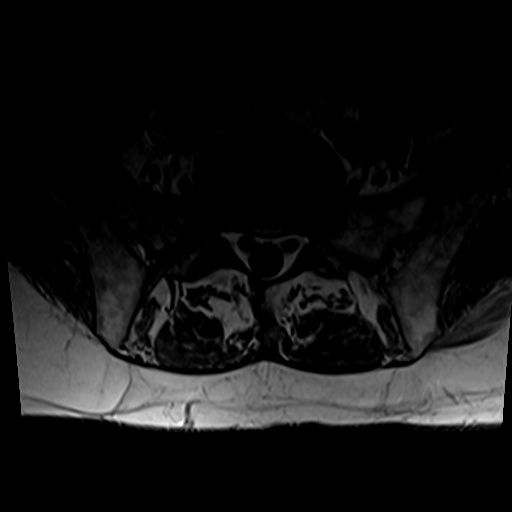
[im 10/49]
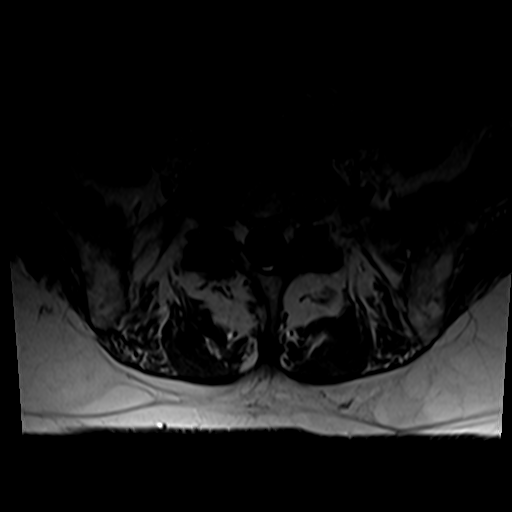
[im 26/49]
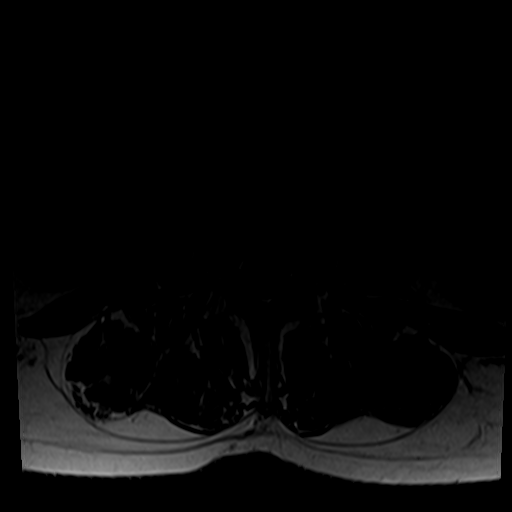
[im 42/49]
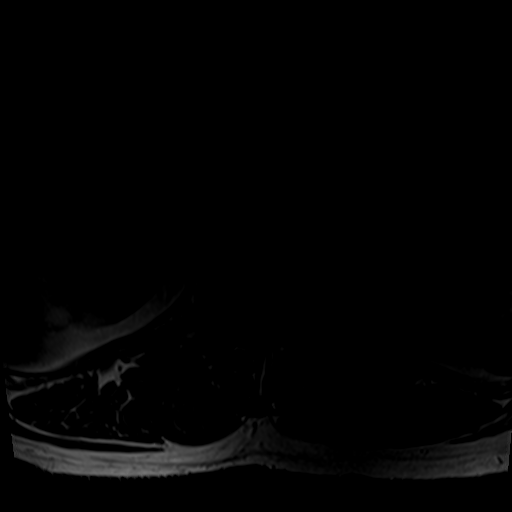

[26 of 48 positions shown; findings below may reference images not displayed]

FINDINGS: Segmentation:  5 lumbar type vertebral bodies.

Alignment:  Normal

Vertebrae:  Normal

Conus medullaris and cauda equina: Conus extends to the T12 level.
There is an intradural mass at the T12-L1 level extending over a
length of 2.4 cm with a transverse diameter of 1.7 cm, filling the
spinal canal and displacing the neural structures anteriorly and
towards the right. This is probably an ependymoma. Differential
diagnosis includes neurofibroma/schwannoma and metastasis.

Paraspinal and other soft tissues: No paraspinal soft tissue lesion
is seen. Tissue in the left upper quadrant assumed to represent the
spleen.

Disc levels:

No disc pathology at T11-12, T12-L1 or L1-2.

L2-3: Left posterolateral disc herniation with slight caudal
migration behind L3. Stenosis of the left lateral recess which could
cause left-sided neural compression.

L3-4: Disc bulge.  No compressive stenosis.

L4-5: Chronic disc degeneration with loss of disc height, endplate
osteophytes and mild bulging of the disc. Mild facet and ligamentous
hypertrophy. Mild stenosis of the lateral recesses but without
likely neural compression.

L5-S1: Mild bulging of the disc. Mild facet degeneration. No
stenosis.
IMPRESSION: 2.4 x 1.7 x 1.7 cm intradural extramedullary mass at the T12-L1
level which is the level of the distal cord/conus/cauda. Neural
structures are compressed anteriorly and towards the right in the
spinal canal. Most likely diagnosis is ependymoma, though schwannoma
and metastatic disease are considered. I would suggest postcontrast
imaging to further characterize this abnormality. One might also
consider screening the rest of the spinal canal.

Left posterolateral disc herniation at L2-3 with caudal migration
behind L3, which could cause neural compression on the left.

Chronic, probably non-compressive degenerative changes L3-4, L4-5
and L5-S1.

## 2020-05-24 ENCOUNTER — Other Ambulatory Visit: Payer: Self-pay | Admitting: Neurology

## 2020-05-24 ENCOUNTER — Other Ambulatory Visit: Payer: Self-pay

## 2020-05-24 MED ORDER — EMGALITY 120 MG/ML ~~LOC~~ SOAJ: 120.0000 mg | SUBCUTANEOUS | 6 refills | Status: DC

## 2020-06-02 ENCOUNTER — Ambulatory Visit: Payer: 59 | Admitting: Adult Health

## 2020-06-02 VITALS — BP 120/70 | HR 88 | Ht 76.0 in | Wt 235.6 lb

## 2020-06-02 DIAGNOSIS — G43C1 Periodic headache syndromes in child or adult, intractable: Secondary | ICD-10-CM

## 2020-06-02 MED ORDER — VERAPAMIL HCL ER 120 MG PO TBCR: 120.0000 mg | EXTENDED_RELEASE_TABLET | Freq: Every day | ORAL | 5 refills | Status: DC

## 2020-06-02 NOTE — Patient Instructions (Addendum)
Your Plan:  Continue Emgality and Maxalt for headaches Start Veramapil CR 120 mg at bedtime If your symptoms worsen or you develop new symptoms please let us know.   Thank you for coming to see [place] at [facility]. I hope we have been able to provide you high quality care today.  You may receive a patient satisfaction survey over the next few weeks. We would appreciate your feedback and comments so that we may continue to improve ourselves and the health of our patients.  Verapamil Sustained-Release Oral Capsules What is this medicine? VERAPAMIL (ver AP a mil) is a calcium channel blocker. It relaxes your blood vessels and decreases the amount of work the heart has to do. It treats high blood pressure. This medicine may be used for other purposes; ask your health care provider or pharmacist if you have questions. COMMON BRAND NAME(S): Verelan, Verelan PM What should I tell my health care provider before I take this medicine? They need to know if you have any of these conditions:  [place] muscular dystrophy  heart disease  irregular heartbeat or rhythm  liver disease  low blood pressure  an unusual or allergic reaction to verapamil, other drugs, foods, dyes or preservatives  pregnant or trying to get pregnant  breast-feeding How should I use this medicine? Take this drug by mouth with water. Take it as directed on the prescription label at the same time every day. Do not cut, crush or chew this drug. Swallow the capsules whole. You may open the capsule and put the contents in 1 teaspoon of applesauce. Swallow the drug and applesauce right away. Do not chew the drug or applesauce. Keep taking it unless your health care provider tells you to stop. Do not take this drug with grapefruit juice. Talk to your health care provider about the use of this drug in children. Special care may be needed. Overdosage: If you think you have taken too much of this medicine contact  a poison control center or emergency room at once. NOTE: This medicine is only for you. Do not share this medicine with others. What if I miss a dose? If you miss a dose, take it as soon as you can. If it is almost time for your next dose, take only that dose. Do not take double or extra doses. What may interact with this medicine? Do not take this medicine with any of the following:  cisapride  disopyramide  dofetilide  grapefruit juice  hawthorn  pimozide  red yeast rice This medicine may also interact with the following medications:  barbiturates such as phenobarbital  cimetidine  cyclosporine  lithium  local anesthetics or general anesthetics  medicines for heart rhythm problems like amiodarone, digoxin, flecainide, procainamide, quinidine  medicines for high blood pressure or heart problems  medicines for seizures like carbamazepine and phenytoin  rifampin, rifabutin or rifapentine  theophylline or aminophylline This list may not describe all possible interactions. Give your health care provider a list of all the medicines, herbs, non-prescription drugs, or dietary supplements you use. Also tell them if you smoke, drink alcohol, or use illegal drugs. Some items may interact with your medicine. What should I watch for while using this medicine? Visit your health care provider for regular checks on your progress. Check your blood pressure as directed. Ask your health care provider what your blood pressure should be. Also, find out when you should contact him or her. Do not treat yourself for coughs, colds, or pain  while you are using this drug without asking your health care provider for advice. Some drugs may increase your blood pressure. You may get drowsy or dizzy. Do not drive, use machinery, or do anything that needs mental alertness until you know how this drug affects you. Do not stand up or sit up quickly, especially if you are an older patient. This reduces  the risk of dizzy or fainting spells. What side effects may I notice from receiving this medicine? Side effects that you should report to your doctor or health care professional as soon as possible:  allergic reactions (skin rash, itching or hives; swelling of the face, lips, or tongue)  heart failure (trouble breathing; fast, irregular heartbeat; sudden weight gain; swelling of the ankles, feet, hands; unusually weak or tired)  heartbeat rhythm changes (trouble breathing; chest pain; dizziness; fast, irregular heartbeat; feeling faint or lightheaded, falls)  low blood pressure (dizziness; feeling faint or lightheaded, falls; unusually weak or tired) Side effects that usually do not require medical attention (report to your doctor or health care professional if they continue or are bothersome):  constipation  facial flushing  headache  nausea  stomach pain  unusual swelling This list may not describe all possible side effects. Call your doctor for medical advice about side effects. You may report side effects to FDA at 1-800-FDA-1088. Where should I keep my medicine? Keep out of the reach of children and pets. Store at room temperature between 20 and 25 degrees C (68 and 77 degrees F). Protect from moisture. Keep the container tightly closed. Avoid exposure to extreme heat. Throw away any unused drug after the expiration date. NOTE: This sheet is a summary. It may not cover all possible information. If you have questions about this medicine, talk to your doctor, pharmacist, or health care provider.  2020 Elsevier/Gold Standard (2019-08-26 12:02:33)

## 2020-06-02 NOTE — Progress Notes (Signed)
PATIENT: Michael Garcia. DOB: April 22, 1956  REASON FOR VISIT: follow up HISTORY FROM: patient  HISTORY OF PRESENT ILLNESS: Today 06/02/20:  Michael Garcia is a 64 year old male with a history of migraine headaches.  He returns today for follow-up.  He is currently on Emgality and Maxalt.  He reports that he has a headache almost every other day.  The severity of his headaches vary.  He states that he can use Maxalt and that usually offer some benefit.  Typically uses at least 9 tablets a month.  He also uses ice and heat therapy.  He also has a compounded cream that he rubs on the back of his neck.  In the past he has been on Depakote and gabapentin.  Both of these medications offer good benefit with his headaches but he began having tremor and gait abnormalities.  He returns today for an evaluation.  HISTORY (copied from Dr. Tobey Grim note)  Michael Garcia is a 64 year old right-handed white male with a history of migraine headaches.  He also has a history of a schwannoma resection from the conus medullaris, he is followed through [facility] for this.  He has migraine headaches mainly in the left occipital distribution, the combination of Emgality, Depakote, and gabapentin significantly reduced the headache frequency, he was only having about 2 headaches a month on this medication regimen.  However, the patient developed significant gait instability on the gabapentin and severe tremors of the upper extremities on Depakote.  He was forced to come off these medications and subsequently his headaches have worsened.  He believes that [employer] worked better [employer] that he is on currently.  He is now having 8 or 9 headache days a month, he may miss work on occasion because of this.  He is having ongoing problems with left leg discomfort secondary to the schwannoma residual.  He will be having MRI of the lumbar spine in the next month.  He has not had any further falls, his handwriting has gotten  much better.  He returns for an evaluation.  Maxalt is effective in controlling the headache if he takes it early.  REVIEW OF SYSTEMS: Out of a complete 14 system review of symptoms, the patient complains only of the following symptoms, and all other reviewed systems are negative.  See HPI  ALLERGIES: Allergies  Allergen Reactions  . Bacitracin Itching and Rash  . Iodine Itching and Rash    IV Iodine   . Sulfamethoxazole-Trimethoprim Rash    HOME MEDICATIONS: Outpatient Medications Prior to Visit  Medication Sig Dispense Refill  . alfuzosin (UROXATRAL) 10 MG 24 hr tablet TAKE 1 TABLET(10 MG) BY MOUTH DAILY 90 tablet 0  . ALPRAZolam (XANAX) 1 MG tablet Take 1 mg by mouth as needed. Take 1/2 tablet twice daily and 2 tablets at bedtime.  2  . buPROPion (WELLBUTRIN XL) 300 MG 24 hr tablet Take 450 mg by mouth daily.     . cetirizine (ZYRTEC) 10 MG tablet Take 1 tablet by mouth daily.    Marland Kitchen esomeprazole (NEXIUM) 40 MG capsule TAKE 1 CAPSULE(40 MG) BY MOUTH DAILY 30 capsule 0  . FIBER PO Take by mouth.    . fluticasone (FLONASE) 50 MCG/ACT nasal spray INSTILL 2 SPRAYS IN EACH NOSTRIL EVERY DAY 16 g 5  . Galcanezumab-gnlm (EMGALITY) 120 MG/ML SOAJ Inject 120 mg into the skin every 30 (thirty) days. 1 pen 6  . lamoTRIgine (LAMICTAL) 150 MG tablet Take 150 mg by mouth 2 (two) times  daily.    . lidocaine (LIDODERM) 5 % USE AS DIRECTED ONCE DAILY EXTERNALLY 30 patch 2  . Lidocaine 5 % CREA     . mirtazapine (REMERON) 7.5 MG tablet Take 7.5 mg by mouth at bedtime.    . MULTIPLE VITAMIN PO Take by mouth.    . QUEtiapine (SEROQUEL) 100 MG tablet Take 150 mg by mouth at bedtime.    . rizatriptan (MAXALT) 10 MG tablet Take 1 tablet (10 mg total) by mouth as needed for migraine. 10 tablet 5  . traZODone (DESYREL) 100 MG tablet Take 1 tablet (100 mg total) by mouth at bedtime as needed for sleep. 30 tablet 2   No facility-administered medications prior to visit.    PAST MEDICAL HISTORY: Past  Medical History:  Diagnosis Date  . Alcoholism ([place])   . Anemia 11/21/2017  . Anxiety   . Arthritis   . Benign non-nodular prostatic hyperplasia with lower urinary tract symptoms 11/21/2017  . Bilateral arm pain 04/29/2014  . Cervical spondylosis without myelopathy 04/29/2014  . Cervical stenosis of spinal canal 11/12/2008  . Chronic headaches   . Chronic myofascial pain 11/21/2017  . Constipation 11/21/2017  . Degenerative disc disease, cervical 04/29/2014  . Depression   . Erectile disorder, acquired, generalized, severe 11/21/2017  . GAD (generalized anxiety disorder) 03/29/2016  . Gastro-esophageal reflux disease with esophagitis 11/21/2017  . Glaucoma 11/21/2017  . Incomplete right bundle branch block 10/18/2016  . Insomnia, persistent 03/29/2016  . Lichen planus, penis 11/21/2017  . Lipid disorder 11/21/2017  . Lumbar radicular pain 11/21/2017  . Nondependent alcohol abuse, in remission 03/29/2016  . Nonintractable migraine, unspecified migraine type 11/21/2017  . Peyronie disease 11/12/2016  . Status post cervical spinal fusion 04/29/2014  . Thyroid nodule 11/21/2017    PAST SURGICAL HISTORY: Past Surgical History:  Procedure Laterality Date  . CERVICAL SPINE SURGERY  2010   c6-c7 fusion  . HERNIA REPAIR      FAMILY HISTORY: Family History  Problem Relation Age of Onset  . Heart disease Mother   . Irritable bowel syndrome Mother   . Diabetes Sister     SOCIAL HISTORY: Social History   Socioeconomic History  . Marital status: Married    Spouse name: Not on file  . Number of children: Not on file  . Years of education: Not on file  . Highest education level: Not on file  Occupational History  . Occupation: Scientist, physiological: [employer]  Tobacco Use  . Smoking status: Former Smoker    Types: Cigarettes    Quit date: 1994    Years since quitting: 27.5  . Smokeless tobacco: Never Used  Vaping Use  . Vaping Use: Never used  Substance and Sexual Activity  .  Alcohol use: No  . Drug use: No  . Sexual activity: Not on file  Other Topics Concern  . Not on file  Social History Narrative   Patient recently moved to this area with his wife and daughter.     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     Though he has many medical issues, he is a generally healthy appearing gentleman.     He does not exercise regularly but does try to exercise as much as possible.     No high risk behaviors.     He was previously traveling to [facility] for treatments but will be transitioning his care to [place].   Caffeine use:    Social  Determinants of Health   Financial Resource Strain:   . Difficulty of Paying Living Expenses:   Food Insecurity:   . Worried About Charity fundraiser in the Last Year:   . Arboriculturist in the Last Year:   Transportation Needs:   . Film/video editor (Medical):   Marland Kitchen Lack of Transportation (Non-Medical):   Physical Activity:   . Days of Exercise per Week:   . Minutes of Exercise per Session:   Stress:   . Feeling of Stress :   Social Connections:   . Frequency of Communication with Friends and Family:   . Frequency of Social Gatherings with Friends and Family:   . Attends Religious Services:   . Active Member of Clubs or Organizations:   . Attends Archivist Meetings:   Marland Kitchen Marital Status:   Intimate Partner Violence:   . Fear of Current or Ex-Partner:   . Emotionally Abused:   Marland Kitchen Physically Abused:   . Sexually Abused:       PHYSICAL EXAM  Vitals:   06/02/20 0825  BP: 120/70  Pulse: 88  Weight: 235 lb 9.6 oz (106.9 kg)  Height: 6\' 4"  (1.93 m)   Body mass index is 28.68 kg/m.  Generalized: Well developed, in no acute distress   Neurological examination  Mentation: Alert oriented to time, place, history taking. Follows all commands speech and language fluent Cranial nerve II-XII: Pupils were equal round reactive to light. Extraocular movements were full, visual field were full on  confrontational test. Facial sensation and strength were normal. Uvula tongue midline. Head turning and shoulder shrug  were normal and symmetric. Motor: The motor testing reveals 5 over 5 strength of all 4 extremities. Good symmetric motor tone is noted throughout.  Sensory: Sensory testing is intact to soft touch on all 4 extremities. No evidence of extinction is noted.  Coordination: Cerebellar testing reveals good finger-nose-finger and heel-to-shin bilaterally.  Gait and station: Gait is normal.  Reflexes: Deep tendon reflexes are symmetric and normal bilaterally.   DIAGNOSTIC DATA (LABS, IMAGING, TESTING) - I reviewed patient records, labs, notes, testing and imaging myself where available.  Lab Results  Component Value Date   WBC 5.9 09/14/2019   HGB 15.0 09/14/2019   HCT 44.2 09/14/2019   MCV 94 09/14/2019   PLT 234 09/14/2019      Component Value Date/Time   NA 138 09/14/2019 0929   K 4.8 09/14/2019 0929   CL 99 09/14/2019 0929   CO2 28 09/14/2019 0929   GLUCOSE 108 (H) 09/14/2019 0929   GLUCOSE 100 (H) 05/05/2018 0720   BUN 8 09/14/2019 0929   CREATININE 1.11 09/14/2019 0929   CALCIUM 9.9 09/14/2019 0929   PROT 7.8 09/14/2019 0929   ALBUMIN 5.0 (H) 09/14/2019 0929   AST 10 09/14/2019 0929   ALT 11 09/14/2019 0929   ALKPHOS 78 09/14/2019 0929   BILITOT 0.4 09/14/2019 0929   GFRNONAA 71 09/14/2019 0929   GFRAA 82 09/14/2019 0929   Lab Results  Component Value Date   CHOL 164 05/05/2018   HDL 55.40 05/05/2018   LDLCALC 91 05/05/2018   TRIG 88.0 05/05/2018   CHOLHDL 3 05/05/2018   Lab Results  Component Value Date   HGBA1C 5.3 10/23/2017   Lab Results  Component Value Date   NTIRWERX54 008 04/18/2018   Lab Results  Component Value Date   TSH 3.050 09/14/2019      ASSESSMENT AND PLAN 64 y.o. year old male  has a past medical history of Alcoholism ([place]), Anemia (11/21/2017), Anxiety, Arthritis, Benign non-nodular prostatic hyperplasia with lower  urinary tract symptoms (11/21/2017), Bilateral arm pain (04/29/2014), Cervical spondylosis without myelopathy (04/29/2014), Cervical stenosis of spinal canal (11/12/2008), Chronic headaches, Chronic myofascial pain (11/21/2017), Constipation (11/21/2017), Degenerative disc disease, cervical (04/29/2014), Depression, Erectile disorder, acquired, generalized, severe (11/21/2017), GAD (generalized anxiety disorder) (03/29/2016), Gastro-esophageal reflux disease with esophagitis (11/21/2017), Glaucoma (11/21/2017), Incomplete right bundle branch block (10/18/2016), Insomnia, persistent (4/91/7915), Lichen planus, penis (11/21/2017), Lipid disorder (11/21/2017), Lumbar radicular pain (11/21/2017), Nondependent alcohol abuse, in remission (03/29/2016), Nonintractable migraine, unspecified migraine type (11/21/2017), Peyronie disease (11/12/2016), Status post cervical spinal fusion (04/29/2014), and Thyroid nodule (11/21/2017). here with :  Migraine headaches   Continue Emgality and Maxalt  Start verapamil controlled release 120 mg at bedtime  Reviewed this medication with the patient and provided him with a handout  Follow-up in 6 months or sooner if needed   I spent 20 minutes of face-to-face and non-face-to-face time with patient.  This included previsit chart review, lab review, study review, order entry, electronic health record documentation, patient education.  Ward Givens, MSN, NP-C 06/02/2020, 8:38 AM [facility] [address], [place], [place] [address] [phone]

## 2020-06-02 NOTE — Progress Notes (Signed)
I have read the note, and I agree with the clinical assessment and plan.  Stephie Xu K Wakeelah Solan   

## 2020-06-16 ENCOUNTER — Telehealth: Payer: Self-pay | Admitting: Adult Health

## 2020-06-16 NOTE — Telephone Encounter (Signed)
I reached out to [facility] after receiving  an error from cover my meds that the pt was not active.  I spoke with [place] and completed a verbal PA for emgality auto injectors 120 mg.   CASE ID [id number] Typical turn around time for PA decision is 5 business days.  Will update with response.

## 2020-06-16 NOTE — Telephone Encounter (Signed)
Michael Garcia @ [name] has called to report they are still waiting to receive the PA for pt's Galcanezumab-gnlm (EMGALITY) 120 MG/ML SOAJ if they can be of any assistance they can be called at [phone]

## 2020-06-27 ENCOUNTER — Other Ambulatory Visit: Payer: Self-pay | Admitting: Neurology

## 2020-06-27 MED ORDER — VERAPAMIL HCL ER 240 MG PO TBCR: 240.0000 mg | EXTENDED_RELEASE_TABLET | Freq: Every day | ORAL | 1 refills | Status: DC

## 2020-06-27 MED ORDER — GABAPENTIN 100 MG PO CAPS: 100.0000 mg | ORAL_CAPSULE | Freq: Three times a day (TID) | ORAL | 3 refills | Status: DC

## 2020-06-27 MED ORDER — AJOVY 225 MG/1.5ML ~~LOC~~ SOAJ: 225.0000 mg | SUBCUTANEOUS | 4 refills | Status: DC

## 2020-06-28 ENCOUNTER — Telehealth: Payer: Self-pay | Admitting: *Deleted

## 2020-06-28 NOTE — Telephone Encounter (Signed)
Submitted PA ajovy on CMM. KeyTruddie Coco - PA Case ID: [id number] - Rx #: [id number]. Waiting on determination from [place].

## 2020-06-28 NOTE — Telephone Encounter (Signed)
JQGBEE:[id number];XJOITG:[id number];Review Type:Prior Auth;Coverage Start Date:06/28/2020;Coverage End Date:12/25/2020;

## 2020-06-28 NOTE — Addendum Note (Signed)
Addended by: Wyvonnia Lora on: 06/28/2020 03:53 PM   Modules accepted: Orders

## 2020-07-08 ENCOUNTER — Emergency Department (HOSPITAL_BASED_OUTPATIENT_CLINIC_OR_DEPARTMENT_OTHER): Payer: Managed Care, Other (non HMO)

## 2020-07-08 ENCOUNTER — Emergency Department (HOSPITAL_BASED_OUTPATIENT_CLINIC_OR_DEPARTMENT_OTHER)
Admission: EM | Admit: 2020-07-08 | Discharge: 2020-07-08 | Disposition: A | Discharge status: Home / Self Care | Payer: Managed Care, Other (non HMO) | Attending: Emergency Medicine | Admitting: Emergency Medicine

## 2020-07-08 ENCOUNTER — Encounter (HOSPITAL_BASED_OUTPATIENT_CLINIC_OR_DEPARTMENT_OTHER): Payer: Self-pay | Admitting: Emergency Medicine

## 2020-07-08 DIAGNOSIS — K219 Gastro-esophageal reflux disease without esophagitis: Secondary | ICD-10-CM | POA: Diagnosis not present

## 2020-07-08 DIAGNOSIS — Z87891 Personal history of nicotine dependence: Secondary | ICD-10-CM | POA: Diagnosis not present

## 2020-07-08 DIAGNOSIS — R1011 Right upper quadrant pain: Secondary | ICD-10-CM | POA: Insufficient documentation

## 2020-07-08 DIAGNOSIS — K567 Ileus, unspecified: Secondary | ICD-10-CM | POA: Diagnosis not present

## 2020-07-08 DIAGNOSIS — R109 Unspecified abdominal pain: Secondary | ICD-10-CM | POA: Diagnosis present

## 2020-07-08 LAB — COMPREHENSIVE METABOLIC PANEL
ALT: 19 U/L (ref 0–44)
AST: 22 U/L (ref 15–41)
Albumin: 5 g/dL (ref 3.5–5.0)
Alkaline Phosphatase: 67 U/L (ref 38–126)
Anion gap: 12 (ref 5–15)
BUN: 17 mg/dL (ref 8–23)
CO2: 27 mmol/L (ref 22–32)
Calcium: 9.6 mg/dL (ref 8.9–10.3)
Chloride: 99 mmol/L (ref 98–111)
Creatinine, Ser: 1.22 mg/dL (ref 0.61–1.24)
GFR calc Af Amer: >60 mL/min (ref >60)
GFR calc non Af Amer: >60 mL/min (ref >60)
Glucose, Bld: 153 mg/dL — ABNORMAL HIGH (ref 70–99)
Potassium: 4.4 mmol/L (ref 3.5–5.1)
Sodium: 138 mmol/L (ref 135–145)
Total Bilirubin: 0.7 mg/dL (ref 0.3–1.2)
Total Protein: 8.6 g/dL — ABNORMAL HIGH (ref 6.5–8.1)

## 2020-07-08 LAB — URINALYSIS, MICROSCOPIC (REFLEX)

## 2020-07-08 LAB — URINALYSIS, ROUTINE W REFLEX MICROSCOPIC
Bilirubin Urine: NEGATIVE
Glucose, UA: NEGATIVE mg/dL
Hgb urine dipstick: NEGATIVE
Ketones, ur: NEGATIVE mg/dL
Nitrite: NEGATIVE
Protein, ur: NEGATIVE mg/dL
Specific Gravity, Urine: >1.03 — ABNORMAL HIGH (ref 1.005–1.030)
pH: 5.5 (ref 5.0–8.0)

## 2020-07-08 LAB — CBC
HCT: 45.8 % (ref 39.0–52.0)
Hemoglobin: 15.4 g/dL (ref 13.0–17.0)
MCH: 32.4 pg (ref 26.0–34.0)
MCHC: 33.6 g/dL (ref 30.0–36.0)
MCV: 96.4 fL (ref 80.0–100.0)
Platelets: 218 10*3/uL (ref 150–400)
RBC: 4.75 MIL/uL (ref 4.22–5.81)
RDW: 11.9 % (ref 11.5–15.5)
WBC: 4.6 10*3/uL (ref 4.0–10.5)
nRBC: 0 % (ref 0.0–0.2)

## 2020-07-08 LAB — LIPASE, BLOOD: Lipase: 21 U/L (ref 11–51)

## 2020-07-08 MED ORDER — ONDANSETRON HCL 4 MG/2ML IJ SOLN
4.0000 mg | Freq: Once | INTRAMUSCULAR | Status: AC
  Administered 2020-07-08: 4 mg via INTRAVENOUS
  Filled 2020-07-08: qty 2

## 2020-07-08 MED ORDER — ONDANSETRON 4 MG PO TBDP: 4.0000 mg | ORAL_TABLET | Freq: Three times a day (TID) | ORAL | 0 refills | Status: DC | PRN

## 2020-07-08 MED ORDER — ONDANSETRON 4 MG PO TBDP: 4.0000 mg | ORAL_TABLET | Freq: Once | ORAL | Status: DC

## 2020-07-08 MED ORDER — DICYCLOMINE HCL 20 MG PO TABS: 20.0000 mg | ORAL_TABLET | Freq: Two times a day (BID) | ORAL | 0 refills | Status: DC

## 2020-07-08 MED ORDER — SODIUM CHLORIDE 0.9 % IV BOLUS
1000.0000 mL | Freq: Once | INTRAVENOUS | Status: AC
  Administered 2020-07-08: 1000 mL via INTRAVENOUS

## 2020-07-08 MED ORDER — HYDROMORPHONE HCL 1 MG/ML IJ SOLN
0.5000 mg | Freq: Once | INTRAMUSCULAR | Status: AC
  Administered 2020-07-08: 0.5 mg via INTRAVENOUS
  Filled 2020-07-08: qty 1

## 2020-07-08 NOTE — Discharge Instructions (Addendum)
You were evaluated in the Emergency Department and after careful evaluation, we did not find any emergent condition requiring admission or further testing in the hospital.  Your exam/testing today is overall reassuring.  Your symptoms seem to be due to a partial obstruction in your intestines or slow transit of digested materials, also known as an ileus.  We are reassured by the rest of your testing today and by the fact that you're feeling better.  Please limit yourself to clear liquids for the next 24 hours.  You can escalate your diet slowly if you're feeling better.  Your condition may resolve on its own with time and resting your bowels.  You can use the Zofran medicine as needed for nausea and you can use the Bentyl medication as needed for discomfort.  Please return to the Emergency Department if you experience any worsening of your condition.   Thank you for allowing [place] to be a part of your care.

## 2020-07-08 NOTE — ED Triage Notes (Addendum)
Rt sided abd pain radiated to flank and back n/v since yesterday , pt arrives EMS has 20 left AC  Got fentyl 100 and 4 of zofran

## 2020-07-08 NOTE — ED Notes (Signed)
Patient transported to CT 

## 2020-07-08 NOTE — ED Provider Notes (Signed)
[facility] Emergency Department Provider Note MRN:  [id number]  Arrival date & time: 07/08/20     Chief Complaint   Abdominal Pain and Vomiting   History of Present Illness   Michael Garcia. is a 64 y.o. year-old male with a history of GERD presenting to the ED with chief complaint of abdominal pain.  Location: Right upper quadrant radiating to the right flank and right back Duration: Almost 24 hours Onset: Sudden Timing: Constant Description: Sharp Severity: Moderate to severe Exacerbating/Alleviating Factors: None Associated Symptoms: Nausea and vomiting, multiple episodes, nonbloody, nonbilious. Pertinent Negatives: Denies fever, no headache, no cough, no chest pain, no shortness of breath, no lower abdominal pain, mild diarrhea last night.   Review of Systems  A complete 10 system review of systems was obtained and all systems are negative except as noted in the HPI and PMH.   Patient's Health History    Past Medical History:  Diagnosis Date  . Alcoholism ([place])   . Anemia 11/21/2017  . Anxiety   . Arthritis   . Benign non-nodular prostatic hyperplasia with lower urinary tract symptoms 11/21/2017  . Bilateral arm pain 04/29/2014  . Cervical spondylosis without myelopathy 04/29/2014  . Cervical stenosis of spinal canal 11/12/2008  . Chronic headaches   . Chronic myofascial pain 11/21/2017  . Constipation 11/21/2017  . Degenerative disc disease, cervical 04/29/2014  . Depression   . Erectile disorder, acquired, generalized, severe 11/21/2017  . GAD (generalized anxiety disorder) 03/29/2016  . Gastro-esophageal reflux disease with esophagitis 11/21/2017  . Glaucoma 11/21/2017  . Incomplete right bundle branch block 10/18/2016  . Insomnia, persistent 03/29/2016  . Lichen planus, penis 11/21/2017  . Lipid disorder 11/21/2017  . Lumbar radicular pain 11/21/2017  . Nondependent alcohol abuse, in remission 03/29/2016  . Nonintractable migraine,  unspecified migraine type 11/21/2017  . Peyronie disease 11/12/2016  . Status post cervical spinal fusion 04/29/2014  . Thyroid nodule 11/21/2017    Past Surgical History:  Procedure Laterality Date  . CERVICAL SPINE SURGERY  2010   c6-c7 fusion  . HERNIA REPAIR      Family History  Problem Relation Age of Onset  . Heart disease Mother   . Irritable bowel syndrome Mother   . Diabetes Sister     Social History   Socioeconomic History  . Marital status: Married    Spouse name: Not on file  . Number of children: Not on file  . Years of education: Not on file  . Highest education level: Not on file  Occupational History  . Occupation: Scientist, physiological: [employer]  Tobacco Use  . Smoking status: Former Smoker    Types: Cigarettes    Quit date: 1994    Years since quitting: 27.6  . Smokeless tobacco: Never Used  Vaping Use  . Vaping Use: Never used  Substance and Sexual Activity  . Alcohol use: No  . Drug use: No  . Sexual activity: Not on file  Other Topics Concern  . Not on file  Social History Narrative   Patient recently moved to this area with his wife and daughter.     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     Though he has many medical issues, he is a generally healthy appearing gentleman.     He does not exercise regularly but does try to exercise as much as possible.     No high risk behaviors.     He  was previously traveling to [facility] for treatments but will be transitioning his care to [place].   Caffeine use:    Social Determinants of Health   Financial Resource Strain:   . Difficulty of Paying Living Expenses: Not on file  Food Insecurity:   . Worried About Charity fundraiser in the Last Year: Not on file  . Ran Out of Food in the Last Year: Not on file  Transportation Needs:   . Lack of Transportation (Medical): Not on file  . Lack of Transportation (Non-Medical): Not on file  Physical Activity:   . Days of Exercise per Week: Not on  file  . Minutes of Exercise per Session: Not on file  Stress:   . Feeling of Stress : Not on file  Social Connections:   . Frequency of Communication with Friends and Family: Not on file  . Frequency of Social Gatherings with Friends and Family: Not on file  . Attends Religious Services: Not on file  . Active Member of Clubs or Organizations: Not on file  . Attends Archivist Meetings: Not on file  . Marital Status: Not on file  Intimate Partner Violence:   . Fear of Current or Ex-Partner: Not on file  . Emotionally Abused: Not on file  . Physically Abused: Not on file  . Sexually Abused: Not on file     Physical Exam   Vitals:   07/08/20 1806 07/08/20 2029  BP: (!) 148/80 119/68  Pulse: (!) 102 (!) 104  Resp: 18 18  Temp: 98.9 F (37.2 C) 99.9 F (37.7 C)  SpO2: 97% 99%    CONSTITUTIONAL: Well-appearing, NAD NEURO:  Alert and oriented x 3, no focal deficits EYES:  eyes equal and reactive ENT/NECK:  no LAD, no JVD CARDIO: Regular rate, well-perfused, normal S1 and S2 PULM:  CTAB no wheezing or rhonchi GI/GU:  normal bowel sounds, non-distended, mild right upper quadrant tenderness to palpation MSK/SPINE:  No gross deformities, no edema SKIN:  no rash, atraumatic PSYCH:  Appropriate speech and behavior  *Additional and/or pertinent findings included in MDM below  Diagnostic and Interventional Summary    EKG Interpretation  Date/Time:    Ventricular Rate:    PR Interval:    QRS Duration:   QT Interval:    QTC Calculation:   R Axis:     Text Interpretation:        Labs Reviewed  COMPREHENSIVE METABOLIC PANEL - Abnormal; Notable for the following components:      Result Value   Glucose, Bld 153 (*)    Total Protein 8.6 (*)    All other components within normal limits  URINALYSIS, ROUTINE W REFLEX MICROSCOPIC - Abnormal; Notable for the following components:   Specific Gravity, Urine >1.030 (*)    Leukocytes,Ua TRACE (*)    All other components  within normal limits  URINALYSIS, MICROSCOPIC (REFLEX) - Abnormal; Notable for the following components:   Bacteria, UA MANY (*)    All other components within normal limits  URINE CULTURE  LIPASE, BLOOD  CBC    CT ABDOMEN PELVIS WO CONTRAST  Final Result      Medications  ondansetron (ZOFRAN) injection 4 mg (4 mg Intravenous Given 07/08/20 1719)  HYDROmorphone (DILAUDID) injection 0.5 mg (0.5 mg Intravenous Given 07/08/20 1808)  sodium chloride 0.9 % bolus 1,000 mL (0 mLs Intravenous Stopped 07/08/20 2020)     Procedures  /  Critical Care Procedures  ED Course and Medical Decision Making  I have reviewed the triage vital signs, the nursing notes, and pertinent available records from the EMR.  Listed above are laboratory and imaging tests that I personally ordered, reviewed, and interpreted and then considered in my medical decision making (see below for details).  Considering cholelithiasis, cholecystitis, kidney stone, appendicitis, diverticulitis, CT pending.     Labs are overall reassuring, CT reveals dilated loops of bowel, possibly a partial bowel obstruction or ileus.  Patient had loose stools last night, no stools today but passing gas.  He is overall feeling much better and would like to be discharged.  Reasonable to return home and limit oral intake to rest the bowels, strict return precautions.  Barth Kirks. Sedonia Small, [place] [email or web address]  Final Clinical Impressions(s) / ED Diagnoses     ICD-10-CM   1. Ileus, unspecified ([place])  K56.7     ED Discharge Orders         Ordered    dicyclomine (BENTYL) 20 MG tablet  2 times daily        07/08/20 2040    ondansetron (ZOFRAN ODT) 4 MG disintegrating tablet  Every 8 hours PRN        07/08/20 2040           Discharge Instructions Discussed with and Provided to Patient:     Discharge Instructions     You were evaluated in the Emergency Department and after  careful evaluation, we did not find any emergent condition requiring admission or further testing in the hospital.  Your exam/testing today is overall reassuring.  Your symptoms seem to be due to a partial obstruction in your intestines or slow transit of digested materials, also known as an ileus.  We are reassured by the rest of your testing today and by the fact that you're feeling better.  Please limit yourself to clear liquids for the next 24 hours.  You can escalate your diet slowly if you're feeling better.  Your condition may resolve on its own with time and resting your bowels.  You can use the Zofran medicine as needed for nausea and you can use the Bentyl medication as needed for discomfort.  Please return to the Emergency Department if you experience any worsening of your condition.   Thank you for allowing [place] to be a part of your care.       Maudie Flakes, MD 07/08/20 2042

## 2020-07-08 NOTE — ED Notes (Signed)
Pt denies and GI/GU symptoms.

## 2020-07-10 LAB — URINE CULTURE: Culture: <10000 — AB

## 2020-07-25 ENCOUNTER — Encounter: Payer: Self-pay | Admitting: Family Medicine

## 2020-07-25 ENCOUNTER — Other Ambulatory Visit: Payer: Self-pay

## 2020-07-25 ENCOUNTER — Ambulatory Visit: Payer: Managed Care, Other (non HMO) | Admitting: Family Medicine

## 2020-07-25 VITALS — BP 114/78 | HR 75 | Temp 97.7°F | Ht 76.0 in | Wt 233.8 lb

## 2020-07-25 DIAGNOSIS — Z23 Encounter for immunization: Secondary | ICD-10-CM

## 2020-07-25 DIAGNOSIS — M47812 Spondylosis without myelopathy or radiculopathy, cervical region: Secondary | ICD-10-CM

## 2020-07-25 DIAGNOSIS — F3181 Bipolar II disorder: Secondary | ICD-10-CM | POA: Insufficient documentation

## 2020-07-25 DIAGNOSIS — G43C1 Periodic headache syndromes in child or adult, intractable: Secondary | ICD-10-CM

## 2020-07-25 DIAGNOSIS — F319 Bipolar disorder, unspecified: Secondary | ICD-10-CM | POA: Diagnosis not present

## 2020-07-25 DIAGNOSIS — Z981 Arthrodesis status: Secondary | ICD-10-CM | POA: Diagnosis not present

## 2020-07-25 DIAGNOSIS — K2900 Acute gastritis without bleeding: Secondary | ICD-10-CM | POA: Diagnosis not present

## 2020-07-25 DIAGNOSIS — E041 Nontoxic single thyroid nodule: Secondary | ICD-10-CM

## 2020-07-25 DIAGNOSIS — M7918 Myalgia, other site: Secondary | ICD-10-CM

## 2020-07-25 DIAGNOSIS — K635 Polyp of colon: Secondary | ICD-10-CM | POA: Insufficient documentation

## 2020-07-25 DIAGNOSIS — G8929 Other chronic pain: Secondary | ICD-10-CM

## 2020-07-25 DIAGNOSIS — D334 Benign neoplasm of spinal cord: Secondary | ICD-10-CM

## 2020-07-25 DIAGNOSIS — N401 Enlarged prostate with lower urinary tract symptoms: Secondary | ICD-10-CM

## 2020-07-25 HISTORY — DX: Polyp of colon: K63.5

## 2020-07-25 HISTORY — DX: Bipolar disorder, unspecified: F31.9

## 2020-07-25 NOTE — Patient Instructions (Signed)
Please return in June 2022 for your annual complete physical; please come fasting.  Today you were given your flu and 1st of 2 Shingrix vaccinations. Please schedule a nurse visit in 6 months to receive your 2nd shingrix vaccine.   Thank you for bringing all of your recent medical records.  It was a pleasure meeting you today! Thank you for choosing [place] to meet your healthcare needs! I truly look forward to working with you. If you have any questions or concerns, please send me a message via Mychart or call the office at [phone].

## 2020-07-25 NOTE — Progress Notes (Signed)
Subjective  CC:  Chief Complaint  Patient presents with  . Transitions Of Care    Michael Garcia previous PCP    HPI: Michael Garcia. is a 64 y.o. male who presents to [facility] at [facility] today to establish care with me as a new patient.  I reviewed medical records including past urology, PCP, gastroenterology, neurology notes, lab work results, imaging test results, procedural results.  Was a prior patient of [facility], and then moved to [place] and saw PCP there, now is living again in [place] but working in [place].  He has multiple specialist still growing out.  Will need gastroenterology here.  He is a Clinical biochemist. He has the following concerns or needs:  64 year old male with multiple medical problems including cervical and lumbar DJD disease, chronic left-sided neuropathy and weakness, bipolar disorder with depression, colon polyps, recent gastritis and history of GERD, chronic migraine headaches on preventive medicine.  Reports all his medical problems are well controlled.  Reviewed each in detail.  He had recent CPE with prior PCP.  Reviewed normal lab work which will be abstracted.  No history of diabetes or hyperlipidemia.  No heart disease.  Has history of nontoxic thyroid nodule status post FNA which showed benign pathology.  History of cervical fusion due to cervical DJD.  He has chronic myofascial pain.  He see psychiatry for bipolar disorder 2.  This is stabilizing.  History of colonic polyp with most recent colonoscopy last month.  On 5-year recall.  No family history of colon cancer.  History of schwannoma of spinal cord status post resection.  He has chronic left-sided neuropathy.  Does have frequent falls because of this.  He sees urology due to history of ADD and Peyronie's disease.  He is status post penile implant.  He enjoys work and commutes 4 days/week.  He is married and has 2 adult children.  He reports that 2 years ago he  came out but he and his wife have chosen to remain together married.  Health maintenance: Due for flu and Shingrix vaccines other screens are up-to-date.   Assessment  1. Bipolar 1 disorder, depressed ([place])   2. Status post cervical spinal fusion   3. Nontoxic thyroid nodule   4. Acute superficial gastritis without hemorrhage   5. Schwannoma of spinal cord ([place])   6. Polyp of colon, unspecified part of colon, unspecified type   7. Chronic myofascial pain   8. Cervical spondylosis without myelopathy   9. Intractable periodic headache syndrome   10. Benign non-nodular prostatic hyperplasia with lower urinary tract symptoms   11. Need for immunization against influenza      Plan   Bipolar 1 disorder improving.  Psychiatry is adjusting medications.  He is currently stable.  Chronic myofascial pain syndrome: Manages mostly behaviorally.  At times will need help with anti-inflammatories or prednisone.  Not currently seeing an orthopedic or neurosurgeon.  Recent acute gastritis on chronic PPIs by endoscopy  Colon polyps by colonoscopy.  Screens are up-to-date.  Chronic headaches, migraines on preventatives per neurology and stable  Urology managing BPH and history of ED.  Shingrix No. 1 and flu vaccine given today.  Follow-up in 6 months for second Shingrix  I spent 30 minutes reviewing old records, reviewing imaging studies and lab results.  I spent 30 minutes with face-to-face visit with patient.   Follow up:  No follow-ups on file. Orders Placed This Encounter  Procedures  . Varicella-zoster vaccine  IM (Shingrix)  . Flu Vaccine QUAD 36+ mos IM   No orders of the defined types were placed in this encounter.    Depression screen [facility] 2/9 09/30/2018 05/05/2018 11/21/2017 11/21/2017  Decreased Interest 1 1 0 0  Down, Depressed, Hopeless 1 1 1 1   PHQ - 2 Score 2 2 1 1   Altered sleeping 2 0 3 -  Tired, decreased energy 1 1 0 -  Change in appetite 2 1 0 -  Feeling bad or  failure about yourself  0 1 0 -  Trouble concentrating 1 1 1  -  Moving slowly or fidgety/restless 0 0 0 -  Suicidal thoughts 0 0 0 -  PHQ-9 Score 8 6 5  -  Difficult doing work/chores Not difficult at all - - -    We updated and reviewed the patient's past history in detail and it is documented below.  Patient Active Problem List   Diagnosis Date Noted  . Bipolar 1 disorder, depressed ([place]) 07/25/2020    Priority: High  . Schwannoma of spinal cord ([place]) 04/22/2018    Priority: High    S/p excision; with residual left weakness and neuropathy   . Migraine headache 11/21/2017    Priority: High  . Chronic myofascial pain 11/21/2017    Priority: High  . Cervical spondylosis without myelopathy 04/29/2014    Priority: High  . Cervical stenosis of spinal canal 11/12/2008    Priority: High  . Colon polyps 07/25/2020    Priority: Medium    Colonoscopy 04/2020, [place], benign, recall in 5 years   . Benign non-nodular prostatic hyperplasia with lower urinary tract symptoms 11/21/2017    Priority: Medium  . Gastritis 11/21/2017    Priority: Medium    By EGD 04/2020; h/o h.pylori and GERD. On chronic PPIs. Will need GI in [place]   . Insomnia, persistent 03/29/2016    Priority: Medium  . Erectile dysfunction due to arterial insufficiency 11/21/2017    Priority: Low    Followed by [facility]  Stable, Chronic  App scheduled to discuss possible penile prosthesis with [place]  per 07/17/18 note    . Lichen planus 35/32/9924    Priority: Low    Lichen planus of the penis, improved - Cont Protopic 0.1% BID to the lesions  - Can consider applying after trauma (i.e. Sexual intercourse) to the area to prevent koebnerization    . Nontoxic thyroid nodule 11/21/2017    Priority: Low    Nontoxic, FNA 04/2020: benign nodule.   . Glaucoma 11/21/2017    Priority: Low  . Peyronie's disease 11/12/2016    Priority: Low    Followed by [facility]  Stable, chronic    . Incomplete  right bundle branch block 10/18/2016    Priority: Low  . Nondependent alcohol abuse, in remission 03/29/2016    Priority: Low  . Status post cervical spinal fusion 04/29/2014    Priority: Low  . Stricture of anterior urethra in male 09/11/2018   Health Maintenance  Topic Date Due  . COLONOSCOPY  03/03/2025  . TETANUS/TDAP  08/06/2027  . INFLUENZA VACCINE  Completed  . COVID-19 Vaccine  Completed  . Hepatitis C Screening  Completed  . HIV Screening  Completed   Immunization History  Administered Date(s) Administered  . Hepatitis A 10/23/2016, 04/24/2017  . Influenza Split 11/22/2015  . Influenza,inj,Quad PF,6+ Mos 07/21/2018, 07/25/2020  . Influenza-Unspecified 09/24/2016, 10/23/2017  . Moderna SARS-COVID-2 Vaccination 11/19/2019, 12/18/2019  . Pneumococcal Polysaccharide-23 05/16/2018  . Tdap 08/05/2017  .  Zoster Recombinat (Shingrix) 07/25/2020   Current Meds  Medication Sig  . alfuzosin (UROXATRAL) 10 MG 24 hr tablet TAKE 1 TABLET(10 MG) BY MOUTH DAILY  . ALPRAZolam (XANAX) 1 MG tablet Take 1 mg by mouth as needed. Take 1/2 tablet twice daily and 2 tablets at bedtime.  [place] buPROPion (WELLBUTRIN XL) 300 MG 24 hr tablet Take 450 mg by mouth daily.   . cetirizine (ZYRTEC) 10 MG tablet Take 1 tablet by mouth daily.  [place] Kitchen dicyclomine (BENTYL) 20 MG tablet Take 1 tablet (20 mg total) by mouth 2 (two) times daily.  [place] Kitchen esomeprazole (NEXIUM) 40 MG capsule TAKE 1 CAPSULE(40 MG) BY MOUTH DAILY  . FIBER PO Take by mouth.  . fluticasone (FLONASE) 50 MCG/ACT nasal spray INSTILL 2 SPRAYS IN EACH NOSTRIL EVERY DAY  . Fremanezumab-vfrm (AJOVY) 225 MG/1.5ML SOAJ Inject 225 mg into the skin every 30 (thirty) days.  Mar[place] Kitchen gabapentin (NEURONTIN) 100 MG capsule Take 1 capsule (100 mg total) by mouth 3 (three) times daily.  Mar[place] Kitchen lamoTRIgine (LAMICTAL) 150 MG tablet Take 150 mg by mouth 2 (two) times daily.  Marland Kitchen lidocaine (LIDODERM) 5 % USE AS DIRECTED ONCE DAILY EXTERNALLY  . Lidocaine 5 % CREA   . MULTIPLE  VITAMIN PO Take by mouth.  . ondansetron (ZOFRAN ODT) 4 MG disintegrating tablet Take 1 tablet (4 mg total) by mouth every 8 (eight) hours as needed for nausea or vomiting.  . rizatriptan (MAXALT) 10 MG tablet Take 1 tablet (10 mg total) by mouth as needed for migraine.  . verapamil (CALAN-SR) 240 MG CR tablet Take 1 tablet (240 mg total) by mouth at bedtime.    Allergies: Patient is allergic to bacitracin, iodine, and sulfamethoxazole-trimethoprim. Past Medical History Patient  has a past medical history of Anxiety, Benign non-nodular prostatic hyperplasia with lower urinary tract symptoms (11/21/2017), Bipolar 1 disorder, depressed ([place]) (07/25/2020), Cervical spondylosis without myelopathy (04/29/2014), Cervical stenosis of spinal canal (11/12/2008), Chronic headaches, Chronic myofascial pain (11/21/2017), Colon polyps (07/25/2020), Degenerative disc disease, cervical (04/29/2014), Erectile disorder, acquired, generalized, severe (11/21/2017), GAD (generalized anxiety disorder) (03/29/2016), Gastro-esophageal reflux disease with esophagitis (11/21/2017), Glaucoma (11/21/2017), Incomplete right bundle branch block (10/18/2016), Insomnia, persistent (3/55/7322), Lichen planus, penis (11/21/2017), Nondependent alcohol abuse, in remission (03/29/2016), Nonintractable migraine, unspecified migraine type (11/21/2017), Peyronie disease (11/12/2016), Status post cervical spinal fusion (04/29/2014), and Thyroid nodule (11/21/2017). Past Surgical History Patient  has a past surgical history that includes Hernia repair and Cervical spine surgery (2010). Family History: Patient family history includes Diabetes in his sister; Healthy in his son; Heart disease in his mother; Irritable bowel syndrome in his mother; Migraines in his daughter; Neuropathy in his daughter. Social History:  Patient  reports that he quit smoking about 27 years ago. His smoking use included cigarettes. He has never used smokeless tobacco. He reports  that he does not drink alcohol and does not use drugs.  Review of Systems: Constitutional: negative for fever or malaise Ophthalmic: negative for photophobia, double vision or loss of vision Cardiovascular: negative for chest pain, dyspnea on exertion, or new LE swelling Respiratory: negative for SOB or persistent cough Gastrointestinal: negative for abdominal pain, change in bowel habits or melena Genitourinary: negative for dysuria or gross hematuria Musculoskeletal: negative for new gait disturbance or muscular weakness Integumentary: negative for new or persistent rashes Neurological: negative for TIA or stroke symptoms Psychiatric: negative for SI or delusions Allergic/Immunologic: negative for hives  Patient Care Team    Relationship Specialty Notifications Start End  [place], Karie Fetch,  MD PCP - General Family Medicine  07/25/20   Magnus Sinning, MD Consulting Physician Physical Medicine and Rehabilitation  04/17/18   Alexis Frock, MD Consulting Physician Urology  07/22/18   Chauncey Mann, MD Referring Physician Psychiatry  11/18/18   Kathrynn Ducking, MD Consulting Physician Neurology  07/25/20     Objective  Vitals: BP 114/78   Pulse 75   Temp 97.7 F (36.5 C) (Temporal)   Ht 6\' 4"  (1.93 m)   Wt 233 lb 12.8 oz (106.1 kg)   SpO2 97%   BMI 28.46 kg/m  General:  Well developed, well nourished, no acute distress  Psych:  Alert and oriented,normal mood and affect HEENT:  Normocephalic, atraumatic, non-icteric sclera, supple neck without adenopathy, mass or thyromegaly Cardiovascular:  RRR without gallop, rub or murmur Respiratory:  Good breath sounds bilaterally, CTAB with normal respiratory effort Skin:  Warm, no rashes or suspicious lesions noted Neurologic:    Mental status is normal.   Commons side effects, risks, benefits, and alternatives for medications and treatment plan prescribed today were discussed, and the patient expressed understanding of the given  instructions. Patient is instructed to call or message via MyChart if he/she has any questions or concerns regarding our treatment plan. No barriers to understanding were identified. We discussed Red Flag symptoms and signs in detail. Patient expressed understanding regarding what to do in case of urgent or emergency type symptoms.   Medication list was reconciled, printed and provided to the patient in AVS. Patient instructions and summary information was reviewed with the patient as documented in the AVS. This note was prepared with assistance of Dragon voice recognition software. Occasional wrong-word or sound-a-like substitutions may have occurred due to the inherent limitations of voice recognition software  This visit occurred during the SARS-CoV-2 public health emergency.  Safety protocols were in place, including screening questions prior to the visit, additional usage of staff PPE, and extensive cleaning of exam room while observing appropriate contact time as indicated for disinfecting solutions.

## 2020-07-26 ENCOUNTER — Other Ambulatory Visit: Payer: Self-pay | Admitting: Neurology

## 2020-07-27 ENCOUNTER — Encounter: Payer: Self-pay | Admitting: Family Medicine

## 2020-08-04 ENCOUNTER — Encounter (INDEPENDENT_AMBULATORY_CARE_PROVIDER_SITE_OTHER): Payer: Self-pay

## 2020-08-23 ENCOUNTER — Encounter: Payer: Self-pay | Admitting: Family Medicine

## 2020-08-25 ENCOUNTER — Other Ambulatory Visit: Payer: Self-pay

## 2020-08-25 ENCOUNTER — Encounter: Payer: Self-pay | Admitting: Family Medicine

## 2020-08-25 ENCOUNTER — Ambulatory Visit: Payer: Managed Care, Other (non HMO) | Admitting: Family Medicine

## 2020-08-25 VITALS — BP 124/82 | HR 82 | Temp 97.6°F | Wt 243.8 lb

## 2020-08-25 DIAGNOSIS — S8011XA Contusion of right lower leg, initial encounter: Secondary | ICD-10-CM

## 2020-08-25 NOTE — Progress Notes (Signed)
Subjective  CC:  Chief Complaint  Patient presents with  . Leg Swelling    dog ran into him on 10/8, leg became bruised and swollen, concerns for cellulitis     HPI: Michael Garcia. is a 64 y.o. male who presents to the office today to address the problems listed above in the chief complaint.  60 pound dog ran into pt's right leg/ankle DOI 08/19/2020: had immediate swelling, "knot" and pain. Was able to weight bear. No twisting of the ankle or fall. Medial lower leg was bruised but became red and tender. The redness has since resolved mostly. Wants to be sure no cellulitis. No hot skin or fevers. No calf pain.   Assessment  1. Contusion of right lower extremity, initial encounter      Plan   Contusion and small hematoma:  Reassured. No sign of infection. Supportive care.   Follow up:   01/25/2021  No orders of the defined types were placed in this encounter.  No orders of the defined types were placed in this encounter.     I reviewed the patients updated PMH, FH, and SocHx.    Patient Active Problem List   Diagnosis Date Noted  . Bipolar 1 disorder, depressed ([place]) 07/25/2020    Priority: High  . Schwannoma of spinal cord ([place]) 04/22/2018    Priority: High  . Migraine headache 11/21/2017    Priority: High  . Chronic myofascial pain 11/21/2017    Priority: High  . Cervical spondylosis without myelopathy 04/29/2014    Priority: High  . Cervical stenosis of spinal canal 11/12/2008    Priority: High  . Colon polyps 07/25/2020    Priority: Medium  . Benign non-nodular prostatic hyperplasia with lower urinary tract symptoms 11/21/2017    Priority: Medium  . Gastritis 11/21/2017    Priority: Medium  . Insomnia, persistent 03/29/2016    Priority: Medium  . Erectile dysfunction due to arterial insufficiency 11/21/2017    Priority: Low  . Lichen planus 63/84/5364    Priority: Low  . Nontoxic thyroid nodule 11/21/2017    Priority: Low  . Glaucoma 11/21/2017     Priority: Low  . Peyronie's disease 11/12/2016    Priority: Low  . Incomplete right bundle branch block 10/18/2016    Priority: Low  . Nondependent alcohol abuse, in remission 03/29/2016    Priority: Low  . Status post cervical spinal fusion 04/29/2014    Priority: Low  . Stricture of anterior urethra in male 09/11/2018   Current Meds  Medication Sig  . alfuzosin (UROXATRAL) 10 MG 24 hr tablet TAKE 1 TABLET(10 MG) BY MOUTH DAILY  . ALPRAZolam (XANAX) 1 MG tablet Take 1 mg by mouth as needed. Take 1/2 tablet twice daily and 2 tablets at bedtime.  . cetirizine (ZYRTEC) 10 MG tablet Take 1 tablet by mouth daily.  Marland Kitchen dicyclomine (BENTYL) 20 MG tablet Take 1 tablet (20 mg total) by mouth 2 (two) times daily.  . DULoxetine (CYMBALTA) 60 MG capsule Take 60 mg by mouth daily.  Marland Kitchen esomeprazole (NEXIUM) 40 MG capsule TAKE 1 CAPSULE(40 MG) BY MOUTH DAILY  . FIBER PO Take by mouth.  . fluticasone (FLONASE) 50 MCG/ACT nasal spray INSTILL 2 SPRAYS IN EACH NOSTRIL EVERY DAY  . Fremanezumab-vfrm (AJOVY) 225 MG/1.5ML SOAJ Inject 225 mg into the skin every 30 (thirty) days.  Marland Kitchen gabapentin (NEURONTIN) 100 MG capsule Take 1 capsule (100 mg total) by mouth 3 (three) times daily.  Marland Kitchen lamoTRIgine (LAMICTAL) 150 MG  tablet Take 150 mg by mouth 2 (two) times daily.  Marland Kitchen lidocaine (LIDODERM) 5 % USE AS DIRECTED ONCE DAILY EXTERNALLY  . Lidocaine 5 % CREA   . MULTIPLE VITAMIN PO Take by mouth.  . QUEtiapine Fumarate (SEROQUEL PO) Take 300 mg by mouth.  . rizatriptan (MAXALT) 10 MG tablet TAKE 1 TABLET BY MOUTH AS NEEDED FOR MIGRAINE.  Marland Kitchen verapamil (CALAN-SR) 240 MG CR tablet Take 1 tablet (240 mg total) by mouth at bedtime.  . [DISCONTINUED] buPROPion (WELLBUTRIN XL) 150 MG 24 hr tablet Take 150 mg by mouth daily.  . [DISCONTINUED] buPROPion (WELLBUTRIN XL) 300 MG 24 hr tablet Take 450 mg by mouth daily.     Allergies: Patient is allergic to bacitracin, iodine, and sulfamethoxazole-trimethoprim. Family  History: Patient family history includes Diabetes in his sister; Healthy in his son; Heart disease in his mother; Irritable bowel syndrome in his mother; Migraines in his daughter; Neuropathy in his daughter. Social History:  Patient  reports that he quit smoking about 27 years ago. His smoking use included cigarettes. He has never used smokeless tobacco. He reports that he does not drink alcohol and does not use drugs.  Review of Systems: Constitutional: Negative for fever malaise or anorexia Cardiovascular: negative for chest pain Respiratory: negative for SOB or persistent cough Gastrointestinal: negative for abdominal pain  Objective  Vitals: BP 124/82   Pulse 82   Temp 97.6 F (36.4 C) (Temporal)   Wt 243 lb 12.8 oz (110.6 kg)   SpO2 94%   BMI 29.68 kg/m  General: no acute distress , A&Ox3 Normal gait Right medial and anterior lower leg with mild swelling and dependent ecchymosis. No erythema or calor. No calf ttp or localized bony ttp     Commons side effects, risks, benefits, and alternatives for medications and treatment plan prescribed today were discussed, and the patient expressed understanding of the given instructions. Patient is instructed to call or message via MyChart if he/she has any questions or concerns regarding our treatment plan. No barriers to understanding were identified. We discussed Red Flag symptoms and signs in detail. Patient expressed understanding regarding what to do in case of urgent or emergency type symptoms.   Medication list was reconciled, printed and provided to the patient in AVS. Patient instructions and summary information was reviewed with the patient as documented in the AVS. This note was prepared with assistance of Dragon voice recognition software. Occasional wrong-word or sound-a-like substitutions may have occurred due to the inherent limitations of voice recognition software  This visit occurred during the SARS-CoV-2 public health  emergency.  Safety protocols were in place, including screening questions prior to the visit, additional usage of staff PPE, and extensive cleaning of exam room while observing appropriate contact time as indicated for disinfecting solutions.

## 2020-10-24 ENCOUNTER — Encounter: Payer: Self-pay | Admitting: Family Medicine

## 2020-10-25 ENCOUNTER — Telehealth: Payer: Self-pay

## 2020-10-25 NOTE — Telephone Encounter (Signed)
Please schedule pt for the 29th at 3 pm for leg swelling. Ok per [employer]

## 2020-10-25 NOTE — Telephone Encounter (Signed)
Pt scheduled  

## 2020-11-08 ENCOUNTER — Encounter: Payer: Self-pay | Admitting: Family Medicine

## 2020-11-08 NOTE — Telephone Encounter (Signed)
Patient is scheduled for 11/09/20.

## 2020-11-08 NOTE — Telephone Encounter (Signed)
Please schedule a visit to discuss what is needed and which labs will need to be ordered and/or which vaccines will need to be updated.

## 2020-11-09 ENCOUNTER — Ambulatory Visit (INDEPENDENT_AMBULATORY_CARE_PROVIDER_SITE_OTHER): Payer: Managed Care, Other (non HMO) | Admitting: Family Medicine

## 2020-11-09 ENCOUNTER — Encounter: Payer: Self-pay | Admitting: Family Medicine

## 2020-11-09 ENCOUNTER — Other Ambulatory Visit: Payer: Self-pay

## 2020-11-09 VITALS — BP 130/90 | HR 84 | Temp 98.3°F | Resp 18 | Ht 76.0 in | Wt 247.8 lb

## 2020-11-09 DIAGNOSIS — M25472 Effusion, left ankle: Secondary | ICD-10-CM

## 2020-11-09 DIAGNOSIS — R030 Elevated blood-pressure reading, without diagnosis of hypertension: Secondary | ICD-10-CM

## 2020-11-09 DIAGNOSIS — R0609 Other forms of dyspnea: Secondary | ICD-10-CM

## 2020-11-09 DIAGNOSIS — F411 Generalized anxiety disorder: Secondary | ICD-10-CM | POA: Diagnosis not present

## 2020-11-09 DIAGNOSIS — R0600 Dyspnea, unspecified: Secondary | ICD-10-CM | POA: Diagnosis not present

## 2020-11-09 DIAGNOSIS — M25471 Effusion, right ankle: Secondary | ICD-10-CM

## 2020-11-09 DIAGNOSIS — I4510 Unspecified right bundle-branch block: Secondary | ICD-10-CM

## 2020-11-09 NOTE — Progress Notes (Signed)
Subjective  CC:  Chief Complaint  Patient presents with  . Leg Swelling    Patient mentioned that over the last couple months he has noticed some swelling to his legs. He stated that he tries to keep them elevated when he can.    HPI: Michael Thu. is a 64 y.o. male who presents to the office today to address the problems listed above in the chief complaint.  64 year old male with bipolar disorder anxiety here for mild ankle edema ongoing for the last few months.  Dependent in nature.  Gets better when he elevates legs.  Because of his anxiety, he gets very concerned about pulmonary or cardiac disease.  He does report dyspnea on exertion after going up a couple flight of stairs.  This is unusual for him.  However, over the last several months he has gained about 15 pounds.  He notices it is harder to get around and it is hard to bend over and tie his shoes.  He denies other episodes of exertional shortness of breath.  He denies angina.  Denies palpitations.  He was put on Seroquel about 6 months ago.  He is also undergoing transcranial magnetic treatments for his depression.  He feels his anxiety is slightly worse than usual.  He has a remote history of smoking.  No history of lung disease or COPD.  He reports he had a negative cardiac evaluation including a normal echocardiogram and stress test in about 2018 by cardiology in [place].  Documentation of this event is noted in old records and I do not have the cardiologist reports.  He denies nausea, vomiting, diaphoresis, GERD symptoms.  I reviewed lab work from August.  He has no history of hypertension.  Assessment  1. Ankle edema, bilateral   2. DOE (dyspnea on exertion)   3. GAD (generalized anxiety disorder)   4. Elevated blood pressure reading without diagnosis of hypertension   5. Incomplete right bundle branch block      Plan   Mild ankle edema and dyspnea on exertion in the setting of weight gain and anxiety: Discussed  possibility of being related to anxiety and weight gain.  We will start with conservative evaluation: Lab work ordered.  Normal EKG in office today.  He will return for chest x-ray.  He will work on improving his diet, weight loss and monitoring his symptoms.  We will evaluate further if symptoms persist or worsen.  He knows he should go to the ER for chest pain.  He is on baby aspirin daily.  Continue anxiety and depression treatment.  Monitor blood pressure  Follow up: Complete physical in June 01/25/2021  Orders Placed This Encounter  Procedures  . DG Chest 2 View  . Comprehensive metabolic panel  . CBC with Differential/Platelet  . TSH  . EKG 12-Lead   No orders of the defined types were placed in this encounter.     I reviewed the patients updated PMH, FH, and SocHx.    Patient Active Problem List   Diagnosis Date Noted  . Bipolar 1 disorder, depressed ([place]) 07/25/2020    Priority: High  . Schwannoma of spinal cord ([place]) 04/22/2018    Priority: High  . Migraine headache 11/21/2017    Priority: High  . Chronic myofascial pain 11/21/2017    Priority: High  . GAD (generalized anxiety disorder) 03/29/2016    Priority: High  . Cervical spondylosis without myelopathy 04/29/2014    Priority: High  . Cervical stenosis of  spinal canal 11/12/2008    Priority: High  . Colon polyps 07/25/2020    Priority: Medium  . Benign non-nodular prostatic hyperplasia with lower urinary tract symptoms 11/21/2017    Priority: Medium  . Gastritis 11/21/2017    Priority: Medium  . Incomplete right bundle branch block 10/18/2016    Priority: Medium  . Insomnia, persistent 03/29/2016    Priority: Medium  . Erectile dysfunction due to arterial insufficiency 11/21/2017    Priority: Low  . Lichen planus 11/21/2017    Priority: Low  . Nontoxic thyroid nodule 11/21/2017    Priority: Low  . Glaucoma 11/21/2017    Priority: Low  . Peyronie's disease 11/12/2016    Priority: Low  . Nondependent  alcohol abuse, in remission 03/29/2016    Priority: Low  . Status post cervical spinal fusion 04/29/2014    Priority: Low  . Stricture of anterior urethra in male 09/11/2018   Current Meds  Medication Sig  . alfuzosin (UROXATRAL) 10 MG 24 hr tablet TAKE 1 TABLET(10 MG) BY MOUTH DAILY  . ALPRAZolam (XANAX) 1 MG tablet Take 1 mg by mouth as needed. Take 1/2 tablet twice daily and 2 tablets at bedtime.  Marland Kitchen buPROPion (WELLBUTRIN XL) 300 MG 24 hr tablet Take 300 mg by mouth at bedtime.  . cetirizine (ZYRTEC) 10 MG tablet Take 1 tablet by mouth daily.  Marland Kitchen esomeprazole (NEXIUM) 40 MG capsule TAKE 1 CAPSULE(40 MG) BY MOUTH DAILY  . FIBER PO Take by mouth.  . fluticasone (FLONASE) 50 MCG/ACT nasal spray INSTILL 2 SPRAYS IN EACH NOSTRIL EVERY DAY  . Fremanezumab-vfrm (AJOVY) 225 MG/1.5ML SOAJ Inject 225 mg into the skin every 30 (thirty) days.  Marland Kitchen gabapentin (NEURONTIN) 100 MG capsule Take 1 capsule (100 mg total) by mouth 3 (three) times daily.  Marland Kitchen lamoTRIgine (LAMICTAL) 150 MG tablet Take 150 mg by mouth 2 (two) times daily.  Marland Kitchen lidocaine (LIDODERM) 5 % USE AS DIRECTED ONCE DAILY EXTERNALLY  . MULTIPLE VITAMIN PO Take by mouth.  . QUEtiapine Fumarate (SEROQUEL PO) Take 300 mg by mouth.  . rizatriptan (MAXALT) 10 MG tablet TAKE 1 TABLET BY MOUTH AS NEEDED FOR MIGRAINE.  Marland Kitchen tacrolimus (PROTOPIC) 0.1 % ointment in the morning and at bedtime.  . verapamil (CALAN-SR) 240 MG CR tablet Take 1 tablet (240 mg total) by mouth at bedtime.    Allergies: Patient is allergic to bacitracin, iodine, and sulfamethoxazole-trimethoprim. Family History: Patient family history includes Diabetes in his sister; Healthy in his son; Heart disease in his mother; Irritable bowel syndrome in his mother; Migraines in his daughter; Neuropathy in his daughter. Social History:  Patient  reports that he quit smoking about 28 years ago. His smoking use included cigarettes. He has never used smokeless tobacco. He reports that he does  not drink alcohol and does not use drugs.  Review of Systems: Constitutional: Negative for fever malaise or anorexia Cardiovascular: negative for chest pain Respiratory: negative for SOB or persistent cough Gastrointestinal: negative for abdominal pain  Objective  Vitals: BP 130/90   Pulse 84   Temp 98.3 F (36.8 C) (Temporal)   Resp 18   Ht 6\' 4"  (1.93 m)   Wt 247 lb 12.8 oz (112.4 kg)   SpO2 97%   BMI 30.16 kg/m  General: no acute distress , A&Ox3, mildly anxious affect HEENT: PEERL, conjunctiva normal, neck is supple, no JVD Cardiovascular:  RRR without murmur or gallop.  Respiratory:  Good breath sounds bilaterally, CTAB with normal respiratory effort, no rales  Gastrointestinal: soft, flat abdomen, normal active bowel sounds, no palpable masses, no hepatosplenomegaly, no appreciated hernias Extremities: Trace nonpitting edema bilateral ankles, no calf tenderness, no cords, no leg edema, normal distal pulses Skin:  Warm, no rashes     Commons side effects, risks, benefits, and alternatives for medications and treatment plan prescribed today were discussed, and the patient expressed understanding of the given instructions. Patient is instructed to call or message via MyChart if he/she has any questions or concerns regarding our treatment plan. No barriers to understanding were identified. We discussed Red Flag symptoms and signs in detail. Patient expressed understanding regarding what to do in case of urgent or emergency type symptoms.   Medication list was reconciled, printed and provided to the patient in AVS. Patient instructions and summary information was reviewed with the patient as documented in the AVS. This note was prepared with assistance of Dragon voice recognition software. Occasional wrong-word or sound-a-like substitutions may have occurred due to the inherent limitations of voice recognition software  This visit occurred during the SARS-CoV-2 public health  emergency.  Safety protocols were in place, including screening questions prior to the visit, additional usage of staff PPE, and extensive cleaning of exam room while observing appropriate contact time as indicated for disinfecting solutions.

## 2020-11-09 NOTE — Patient Instructions (Addendum)
  Please return in June 2022 for your complete physical. Sooner if your symptoms worsen.   Please schedule xray visit for next week.  I will release your lab results to you on your MyChart account with further instructions. Please reply with any questions.  Start eating a low fat, low salt diet. Start walking daily to improve fitness. Return for worsening swelling in legs or shortness of breath. To ER for chest pain.   If you have any questions or concerns, please don't hesitate to send me a message via MyChart or call the office at [phone]. Thank you for visiting with [place] today! It's our pleasure caring for you.  Wt Readings from Last 3 Encounters:  11/09/20 247 lb 12.8 oz (112.4 kg)  08/25/20 243 lb 12.8 oz (110.6 kg)  07/25/20 233 lb 12.8 oz (106.1 kg)

## 2020-11-10 LAB — CBC WITH DIFFERENTIAL/PLATELET
Basophils Absolute: 0 10*3/uL (ref 0.0–0.1)
Basophils Relative: 0.9 % (ref 0.0–3.0)
Eosinophils Absolute: 0.4 10*3/uL (ref 0.0–0.7)
Eosinophils Relative: 9.2 % — ABNORMAL HIGH (ref 0.0–5.0)
HCT: 39.2 % (ref 39.0–52.0)
Hemoglobin: 13.2 g/dL (ref 13.0–17.0)
Lymphocytes Relative: 33.8 % (ref 12.0–46.0)
Lymphs Abs: 1.6 10*3/uL (ref 0.7–4.0)
MCHC: 33.8 g/dL (ref 30.0–36.0)
MCV: 92.9 fl (ref 78.0–100.0)
Monocytes Absolute: 0.6 10*3/uL (ref 0.1–1.0)
Monocytes Relative: 12.4 % — ABNORMAL HIGH (ref 3.0–12.0)
Neutro Abs: 2 10*3/uL (ref 1.4–7.7)
Neutrophils Relative %: 43.7 % (ref 43.0–77.0)
Platelets: 265 10*3/uL (ref 150.0–400.0)
RBC: 4.22 Mil/uL (ref 4.22–5.81)
RDW: 13.5 % (ref 11.5–15.5)
WBC: 4.7 10*3/uL (ref 4.0–10.5)

## 2020-11-10 LAB — COMPREHENSIVE METABOLIC PANEL
ALT: 17 U/L (ref 0–53)
AST: 18 U/L (ref 0–37)
Albumin: 4.8 g/dL (ref 3.5–5.2)
Alkaline Phosphatase: 72 U/L (ref 39–117)
BUN: 11 mg/dL (ref 6–23)
CO2: 30 mEq/L (ref 19–32)
Calcium: 9.2 mg/dL (ref 8.4–10.5)
Chloride: 104 mEq/L (ref 96–112)
Creatinine, Ser: 1.06 mg/dL (ref 0.40–1.50)
GFR: 75.21 mL/min (ref >60.00)
Glucose, Bld: 102 mg/dL — ABNORMAL HIGH (ref 70–99)
Potassium: 3.9 mEq/L (ref 3.5–5.1)
Sodium: 140 mEq/L (ref 135–145)
Total Bilirubin: 0.3 mg/dL (ref 0.2–1.2)
Total Protein: 7.4 g/dL (ref 6.0–8.3)

## 2020-11-10 LAB — TSH: TSH: 3.12 u[IU]/mL (ref 0.35–4.50)

## 2020-11-11 ENCOUNTER — Encounter: Payer: Self-pay | Admitting: Family Medicine

## 2020-11-16 ENCOUNTER — Other Ambulatory Visit: Payer: Self-pay

## 2020-11-16 ENCOUNTER — Other Ambulatory Visit (INDEPENDENT_AMBULATORY_CARE_PROVIDER_SITE_OTHER): Payer: Managed Care, Other (non HMO)

## 2020-11-16 ENCOUNTER — Ambulatory Visit: Payer: Managed Care, Other (non HMO)

## 2020-11-16 DIAGNOSIS — Z111 Encounter for screening for respiratory tuberculosis: Secondary | ICD-10-CM | POA: Diagnosis not present

## 2020-11-18 LAB — QUANTIFERON-TB GOLD PLUS
Mitogen-NIL: >10 IU/mL
NIL: 0.02 IU/mL
QuantiFERON-TB Gold Plus: NEGATIVE
TB1-NIL: 0.04 IU/mL
TB2-NIL: 0.01 IU/mL

## 2020-11-18 NOTE — Progress Notes (Signed)
Please call patient: I have reviewed his/her lab results. TB screen is negative.

## 2020-11-21 ENCOUNTER — Other Ambulatory Visit: Payer: Self-pay | Admitting: Neurology

## 2020-11-22 ENCOUNTER — Other Ambulatory Visit: Payer: Self-pay

## 2020-11-23 ENCOUNTER — Ambulatory Visit (INDEPENDENT_AMBULATORY_CARE_PROVIDER_SITE_OTHER): Payer: Managed Care, Other (non HMO)

## 2020-11-23 ENCOUNTER — Other Ambulatory Visit: Payer: Managed Care, Other (non HMO)

## 2020-11-23 DIAGNOSIS — M25472 Effusion, left ankle: Secondary | ICD-10-CM

## 2020-11-23 DIAGNOSIS — R0600 Dyspnea, unspecified: Secondary | ICD-10-CM

## 2020-11-23 DIAGNOSIS — R0609 Other forms of dyspnea: Secondary | ICD-10-CM

## 2020-11-23 DIAGNOSIS — M25471 Effusion, right ankle: Secondary | ICD-10-CM

## 2020-12-06 ENCOUNTER — Ambulatory Visit: Payer: Managed Care, Other (non HMO) | Admitting: Adult Health

## 2020-12-06 ENCOUNTER — Encounter: Payer: Self-pay | Admitting: Adult Health

## 2020-12-06 VITALS — BP 124/75 | HR 89 | Ht 75.0 in | Wt 250.0 lb

## 2020-12-06 DIAGNOSIS — R269 Unspecified abnormalities of gait and mobility: Secondary | ICD-10-CM

## 2020-12-06 DIAGNOSIS — G43C1 Periodic headache syndromes in child or adult, intractable: Secondary | ICD-10-CM

## 2020-12-06 DIAGNOSIS — R202 Paresthesia of skin: Secondary | ICD-10-CM | POA: Diagnosis not present

## 2020-12-06 MED ORDER — GABAPENTIN 100 MG PO CAPS: 100.0000 mg | ORAL_CAPSULE | Freq: Two times a day (BID) | ORAL | 3 refills | Status: DC

## 2020-12-06 MED ORDER — AJOVY 225 MG/1.5ML ~~LOC~~ SOAJ: SUBCUTANEOUS | 4 refills | Status: DC

## 2020-12-06 MED ORDER — RIZATRIPTAN BENZOATE 10 MG PO TABS: ORAL_TABLET | ORAL | 5 refills | Status: DC

## 2020-12-06 NOTE — Patient Instructions (Signed)
Your Plan:  Continue Ajovy for prevention Use maxalt as abortive therapy Restart gabapentin 100 mg twice a day If your symptoms worsen or you develop new symptoms please let us know.   Thank you for coming to see [place] at [facility]. I hope we have been able to provide you high quality care today.  You may receive a patient satisfaction survey over the next few weeks. We would appreciate your feedback and comments so that we may continue to improve ourselves and the health of our patients.

## 2020-12-06 NOTE — Progress Notes (Signed)
PATIENT: Michael Garcia. DOB: 1956/05/27  REASON FOR VISIT: follow up HISTORY FROM: patient  HISTORY OF PRESENT ILLNESS: Today 12/06/20: Michael Garcia is a 65 year old male with a history of migraine headaches.  He returns today for follow-up.  He is currently on Ajovy and Maxalt.  Reports that his headaches remain under good control.  States that he has approximately 7-8 migraine headaches a month.  He states the intensity and the longevity of the headache has improved.  He states that 90% of the time he takes Maxalt his headache will resolve in 1 to 2 hours.  Patient reports that since he stopped gabapentin he has noticed more discomfort in the feet.  He would like to restart gabapentin.  Denies any changes with his gait or balance.  He has done 2 rounds of physical therapy and thinks his balance has improved.  He does not use an assistive device when ambulating.    06/02/20: Michael Garcia is a 65 year old male with a history of migraine headaches.  He returns today for follow-up.  He is currently on Emgality and Maxalt.  He reports that he has a headache almost every other day.  The severity of his headaches vary.  He states that he can use Maxalt and that usually offer some benefit.  Typically uses at least 9 tablets a month.  He also uses ice and heat therapy.  He also has a compounded cream that he rubs on the back of his neck.  In the past he has been on Depakote and gabapentin.  Both of these medications offer good benefit with his headaches but he began having tremor and gait abnormalities.  He returns today for an evaluation.  HISTORY (copied from Dr. Tobey Grim note)  Michael Garcia is a 65 year old right-handed white male with a history of migraine headaches.  He also has a history of a schwannoma resection from the conus medullaris, he is followed through [facility] for this.  He has migraine headaches mainly in the left occipital distribution, the combination of Emgality,  Depakote, and gabapentin significantly reduced the headache frequency, he was only having about 2 headaches a month on this medication regimen.  However, the patient developed significant gait instability on the gabapentin and severe tremors of the upper extremities on Depakote.  He was forced to come off these medications and subsequently his headaches have worsened.  He believes that [employer] worked better [employer] that he is on currently.  He is now having 8 or 9 headache days a month, he may miss work on occasion because of this.  He is having ongoing problems with left leg discomfort secondary to the schwannoma residual.  He will be having MRI of the lumbar spine in the next month.  He has not had any further falls, his handwriting has gotten much better.  He returns for an evaluation.  Maxalt is effective in controlling the headache if he takes it early.  REVIEW OF SYSTEMS: Out of a complete 14 system review of symptoms, the patient complains only of the following symptoms, and all other reviewed systems are negative.  See HPI  ALLERGIES: Allergies  Allergen Reactions  . Bacitracin Itching and Rash  . Iodine Itching and Rash    IV Iodine   . Sulfamethoxazole-Trimethoprim Rash    HOME MEDICATIONS: Outpatient Medications Prior to Visit  Medication Sig Dispense Refill  . AJOVY 225 MG/1.5ML SOAJ INJECT THE CONTENTS OF 1 INJECTOR UNDER THE SKIN EVERY 30 DAYS 1.5 mL  4  . alfuzosin (UROXATRAL) 10 MG 24 hr tablet TAKE 1 TABLET(10 MG) BY MOUTH DAILY 90 tablet 0  . ALPRAZolam (XANAX) 1 MG tablet Take 1 mg by mouth as needed. Take 1/2 tablet twice daily and 2 tablets at bedtime.  2  . buPROPion (WELLBUTRIN XL) 300 MG 24 hr tablet Take 300 mg by mouth at bedtime.    . cetirizine (ZYRTEC) 10 MG tablet Take 1 tablet by mouth daily.    Marland Kitchen esomeprazole (NEXIUM) 40 MG capsule TAKE 1 CAPSULE(40 MG) BY MOUTH DAILY 30 capsule 0  . FIBER PO Take by mouth.    . fluticasone (FLONASE) 50 MCG/ACT nasal  spray INSTILL 2 SPRAYS IN EACH NOSTRIL EVERY DAY 16 g 5  . lamoTRIgine (LAMICTAL) 150 MG tablet Take 150 mg by mouth 2 (two) times daily.    Marland Kitchen lidocaine (LIDODERM) 5 % USE AS DIRECTED ONCE DAILY EXTERNALLY 30 patch 2  . MULTIPLE VITAMIN PO Take by mouth.    . QUEtiapine Fumarate (SEROQUEL PO) Take 300 mg by mouth.    . rizatriptan (MAXALT) 10 MG tablet TAKE 1 TABLET BY MOUTH AS NEEDED FOR MIGRAINE. 10 tablet 5  . tacrolimus (PROTOPIC) 0.1 % ointment in the morning and at bedtime.    . gabapentin (NEURONTIN) 100 MG capsule Take 1 capsule (100 mg total) by mouth 3 (three) times daily. 90 capsule 3  . dicyclomine (BENTYL) 20 MG tablet Take 1 tablet (20 mg total) by mouth 2 (two) times daily. (Patient not taking: Reported on 11/09/2020) 20 tablet 0  . DULoxetine (CYMBALTA) 60 MG capsule Take 60 mg by mouth daily. (Patient not taking: Reported on 11/09/2020)    . verapamil (CALAN-SR) 240 MG CR tablet Take 1 tablet (240 mg total) by mouth at bedtime. 90 tablet 1   No facility-administered medications prior to visit.    PAST MEDICAL HISTORY: Past Medical History:  Diagnosis Date  . Anxiety   . Benign non-nodular prostatic hyperplasia with lower urinary tract symptoms 11/21/2017  . Bipolar 1 disorder, depressed ([place]) 07/25/2020  . Cervical spondylosis without myelopathy 04/29/2014  . Cervical stenosis of spinal canal 11/12/2008  . Chronic headaches   . Chronic myofascial pain 11/21/2017  . Colon polyps 07/25/2020   Colonoscopy 04/2020, [place], benign, recall in 5 years  . Degenerative disc disease, cervical 04/29/2014  . Erectile disorder, acquired, generalized, severe 11/21/2017  . GAD (generalized anxiety disorder) 03/29/2016  . Gastro-esophageal reflux disease with esophagitis 11/21/2017  . Glaucoma 11/21/2017  . Incomplete right bundle branch block 10/18/2016  . Insomnia, persistent 03/29/2016  . Lichen planus, penis 11/21/2017  . Nondependent alcohol abuse, in remission 03/29/2016  .  Nonintractable migraine, unspecified migraine type 11/21/2017  . Peyronie disease 11/12/2016  . Status post cervical spinal fusion 04/29/2014  . Thyroid nodule 11/21/2017   benign by FNA 04/2020    PAST SURGICAL HISTORY: Past Surgical History:  Procedure Laterality Date  . CERVICAL SPINE SURGERY  2010   c6-c7 fusion  . HERNIA REPAIR      FAMILY HISTORY: Family History  Problem Relation Age of Onset  . Heart disease Mother   . Irritable bowel syndrome Mother   . Diabetes Sister   . Neuropathy Daughter   . Migraines Daughter   . Healthy Son     SOCIAL HISTORY: Social History   Socioeconomic History  . Marital status: Married    Spouse name: Not on file  . Number of children: 2  . Years of education: Not  on file  . Highest education level: Not on file  Occupational History  . Occupation: Psychologist, counselling in [place]  Tobacco Use  . Smoking status: Former Smoker    Types: Cigarettes    Quit date: 1994    Years since quitting: 28.0  . Smokeless tobacco: Never Used  Vaping Use  . Vaping Use: Never used  Substance and Sexual Activity  . Alcohol use: No  . Drug use: No  . Sexual activity: Yes  Other Topics Concern  . 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 Identifies as gay, came out at age 89; remains married to wife.    Social Determinants of Health   Financial Resource Strain: Not on file  Food Insecurity: Not on file  Transportation Needs: Not on file  Physical Activity: Not on file  Stress: Not on file  Social Connections: Not on file  Intimate Partner Violence: Not on file      PHYSICAL EXAM  Vitals:   12/06/20 0808  BP: 124/75  Pulse: 89  Weight: 250 lb (113.4 kg)  Height: 6\' 3"  (1.905 m)   Body mass index is 31.25 kg/m.  Generalized: Well developed, in no acute distress   Neurological examination  Mentation: Alert oriented to time, place, history taking. Follows all commands speech and  language fluent Cranial nerve II-XII: Pupils were equal round reactive to light. Extraocular movements were full, visual field were full on confrontational test. Facial sensation and strength were normal. Uvula tongue midline. Head turning and shoulder shrug  were normal and symmetric. Motor: The motor testing reveals 5 over 5 strength of all 4 extremities. Good symmetric motor tone is noted throughout.  Sensory: Sensory testing is intact to soft touch on all 4 extremities. No evidence of extinction is noted.  Coordination: Cerebellar testing reveals good finger-nose-finger and heel-to-shin bilaterally.  Gait and station: Gait is normal.  Reflexes: Deep tendon reflexes are symmetric and normal bilaterally.   DIAGNOSTIC DATA (LABS, IMAGING, TESTING) - I reviewed patient records, labs, notes, testing and imaging myself where available.  Lab Results  Component Value Date   WBC 4.7 11/09/2020   HGB 13.2 11/09/2020   HCT 39.2 11/09/2020   MCV 92.9 11/09/2020   PLT 265.0 11/09/2020      Component Value Date/Time   NA 140 11/09/2020 1529   NA 138 09/14/2019 0929   K 3.9 11/09/2020 1529   CL 104 11/09/2020 1529   CO2 30 11/09/2020 1529   GLUCOSE 102 (H) 11/09/2020 1529   BUN 11 11/09/2020 1529   BUN 8 09/14/2019 0929   CREATININE 1.06 11/09/2020 1529   CALCIUM 9.2 11/09/2020 1529   PROT 7.4 11/09/2020 1529   PROT 7.8 09/14/2019 0929   ALBUMIN 4.8 11/09/2020 1529   ALBUMIN 5.0 (H) 09/14/2019 0929   AST 18 11/09/2020 1529   ALT 17 11/09/2020 1529   ALKPHOS 72 11/09/2020 1529   BILITOT 0.3 11/09/2020 1529   BILITOT 0.4 09/14/2019 0929   GFRNONAA >60 07/08/2020 1653   GFRAA >60 07/08/2020 1653   Lab Results  Component Value Date   CHOL 164 05/05/2018   HDL 55.40 05/05/2018   LDLCALC 91 05/05/2018   TRIG 88.0 05/05/2018   CHOLHDL 3 05/05/2018   Lab Results  Component Value Date   HGBA1C 5.3 10/23/2017   Lab Results  Component Value Date   WPYKDXIP38 250 04/18/2018   Lab  Results  Component Value Date   TSH  3.12 11/09/2020      ASSESSMENT AND PLAN 65 y.o. year old male  has a past medical history of Anxiety, Benign non-nodular prostatic hyperplasia with lower urinary tract symptoms (11/21/2017), Bipolar 1 disorder, depressed ([place]) (07/25/2020), Cervical spondylosis without myelopathy (04/29/2014), Cervical stenosis of spinal canal (11/12/2008), Chronic headaches, Chronic myofascial pain (11/21/2017), Colon polyps (07/25/2020), Degenerative disc disease, cervical (04/29/2014), Erectile disorder, acquired, generalized, severe (11/21/2017), GAD (generalized anxiety disorder) (03/29/2016), Gastro-esophageal reflux disease with esophagitis (11/21/2017), Glaucoma (11/21/2017), Incomplete right bundle branch block (10/18/2016), Insomnia, persistent (5/36/1443), Lichen planus, penis (11/21/2017), Nondependent alcohol abuse, in remission (03/29/2016), Nonintractable migraine, unspecified migraine type (11/21/2017), Peyronie disease (11/12/2016), Status post cervical spinal fusion (04/29/2014), and Thyroid nodule (11/21/2017). here with :  Migraine headaches   Continue Ajovy and Maxalt  Gait abnormality-paresthesias in the lower extremities   Restart gabapentin 100 mg twice a day  Advised if his symptoms worsen or he develops new symptoms he should let us know.  He will follow-up in 6 months or sooner if needed   I spent 20 minutes of face-to-face and non-face-to-face time with patient.  This included previsit chart review, lab review, study review, order entry, electronic health record documentation, patient education.  Ward Givens, MSN, NP-C 12/06/2020, 8:19 AM [facility] [address] [place], [place] [address] [phone]

## 2020-12-06 NOTE — Progress Notes (Signed)
I have read the note, and I agree with the clinical assessment and plan.  Osamah Schmader K Crystel Demarco   

## 2020-12-15 ENCOUNTER — Telehealth: Payer: Self-pay | Admitting: Emergency Medicine

## 2020-12-15 NOTE — Telephone Encounter (Signed)
PA for Ajovy started on [facility], Key: [id number].  Awaiting determination from [facility].

## 2020-12-20 ENCOUNTER — Other Ambulatory Visit: Payer: Self-pay | Admitting: Neurology

## 2020-12-21 NOTE — Telephone Encounter (Signed)
Approvedon February 3 CaseId:[id number];Status:Approved;Review Type:Prior Auth;Coverage Start Date:12/15/2020;Coverage End Date:12/15/2021;

## 2021-01-25 ENCOUNTER — Ambulatory Visit: Payer: BC Managed Care – PPO

## 2021-02-01 ENCOUNTER — Other Ambulatory Visit: Payer: Self-pay

## 2021-02-01 ENCOUNTER — Ambulatory Visit (INDEPENDENT_AMBULATORY_CARE_PROVIDER_SITE_OTHER): Payer: BC Managed Care – PPO | Admitting: *Deleted

## 2021-02-01 DIAGNOSIS — Z23 Encounter for immunization: Secondary | ICD-10-CM | POA: Diagnosis not present

## 2021-02-01 NOTE — Progress Notes (Signed)
Patient present for #2 shingrix vaccine  Vaccine given to patient Patient tolerated well

## 2021-02-07 ENCOUNTER — Telehealth: Payer: Self-pay | Admitting: *Deleted

## 2021-02-07 NOTE — Telephone Encounter (Signed)
PA Ajovy submitted on CMM. AID:[id number]. PA Case ID: [id number] - Rx #: [id number]. Waiting on determination from [facility].

## 2021-02-07 NOTE — Telephone Encounter (Signed)
Received fax from [place] that [place] approved 02/07/21-02/07/22. PA# [place] [id number] non-grandfathered [id number]

## 2021-03-31 ENCOUNTER — Encounter: Payer: Self-pay | Admitting: Adult Health

## 2021-04-03 ENCOUNTER — Telehealth: Payer: Self-pay | Admitting: Physical Medicine and Rehabilitation

## 2021-04-03 DIAGNOSIS — M961 Postlaminectomy syndrome, not elsewhere classified: Secondary | ICD-10-CM

## 2021-04-03 DIAGNOSIS — S39012A Strain of muscle, fascia and tendon of lower back, initial encounter: Secondary | ICD-10-CM

## 2021-04-03 DIAGNOSIS — M5416 Radiculopathy, lumbar region: Secondary | ICD-10-CM

## 2021-04-03 MED ORDER — GABAPENTIN 100 MG PO CAPS: 200.0000 mg | ORAL_CAPSULE | Freq: Two times a day (BID) | ORAL | 3 refills | Status: DC

## 2021-04-03 NOTE — Telephone Encounter (Signed)
Patient called needing to schedule an appointment for his back with Dr. Ernestina Patches. The number to contact patient is [phone]

## 2021-04-04 ENCOUNTER — Other Ambulatory Visit: Payer: Self-pay | Admitting: Physical Medicine and Rehabilitation

## 2021-04-04 ENCOUNTER — Telehealth: Payer: Self-pay

## 2021-04-04 MED ORDER — PREDNISONE 50 MG PO TABS: ORAL_TABLET | ORAL | 0 refills | Status: DC

## 2021-04-04 MED ORDER — TRAMADOL HCL 50 MG PO TABS: 50.0000 mg | ORAL_TABLET | Freq: Three times a day (TID) | ORAL | 0 refills | Status: AC | PRN

## 2021-04-04 NOTE — Telephone Encounter (Signed)
Patient states back pain is coming from an old condition. He spoke with Dr. Laurence Spates who called him in two medications and has him scheduled to come in to discuss chronic pain issues to better manage.

## 2021-04-04 NOTE — Telephone Encounter (Signed)
Patient is suffering from severe back pain to the point he is unable to get out of bed can he get a call back to discuss his options

## 2021-04-04 NOTE — Telephone Encounter (Signed)
Please call him to see what is going on. Is this a new problem/ new pain or an exacerbation of an old condition.. if he can't move, may need ER. Advise otc medications and needs OV for evaluation if sounds stable. Let me know if I can help.

## 2021-04-04 NOTE — Telephone Encounter (Signed)
Patient states that he bent and twisted on Sunday and had immediate sharp pain in his low back which radiates down his left leg to his lower leg. He states that he has difficulty standing and walking due to the pain and has been lying in bed since hen. He states that he has called his PCP as well but has not heard from them. I scheduled him for an office visit next week (he is requesting telemedicine due to increased pain with standing and walking and will call us next week if he still needs this type of visit) and added him to the wait list. He is requesting something to help with his pain until then. Pharmacy is correct. Please advise.

## 2021-04-04 NOTE — Telephone Encounter (Signed)
Please advise 

## 2021-04-04 NOTE — Telephone Encounter (Signed)
I have not seen the patient in quite some time but do note his history very well this we had diagnosed her schwannoma in his spine he has had resection of this.  I did send in prednisone and small bit of tramadol we will try to see him next week obviously.  If his symptoms worsen I would have him go to the emergency department.

## 2021-04-04 NOTE — Telephone Encounter (Signed)
Called patient to advise that prescriptions have been sent to pharmacy and to go the the ED if symptoms worsen, any weakness, etc.

## 2021-04-12 ENCOUNTER — Encounter: Payer: Self-pay | Admitting: Physical Medicine and Rehabilitation

## 2021-04-12 ENCOUNTER — Other Ambulatory Visit: Payer: Self-pay

## 2021-04-12 ENCOUNTER — Ambulatory Visit: Payer: BC Managed Care – PPO | Admitting: Physical Medicine and Rehabilitation

## 2021-04-12 VITALS — BP 115/77 | HR 91

## 2021-04-12 DIAGNOSIS — M961 Postlaminectomy syndrome, not elsewhere classified: Secondary | ICD-10-CM

## 2021-04-12 DIAGNOSIS — G894 Chronic pain syndrome: Secondary | ICD-10-CM | POA: Diagnosis not present

## 2021-04-12 DIAGNOSIS — S39012A Strain of muscle, fascia and tendon of lower back, initial encounter: Secondary | ICD-10-CM | POA: Diagnosis not present

## 2021-04-12 DIAGNOSIS — M542 Cervicalgia: Secondary | ICD-10-CM

## 2021-04-12 DIAGNOSIS — D334 Benign neoplasm of spinal cord: Secondary | ICD-10-CM | POA: Diagnosis not present

## 2021-04-12 DIAGNOSIS — Z981 Arthrodesis status: Secondary | ICD-10-CM

## 2021-04-12 MED ORDER — GABAPENTIN 600 MG PO TABS: 300.0000 mg | ORAL_TABLET | Freq: Three times a day (TID) | ORAL | 1 refills | Status: DC

## 2021-04-12 NOTE — Progress Notes (Signed)
Frans Valente. - 65 y.o. male MRN [id number]  Date of birth: [birth year]  Office Visit Note: Visit Date: 04/12/2021 PCP: Leamon Arnt, MD Referred by: Leamon Arnt, MD  Subjective: Chief Complaint  Patient presents with   Lower Back - Pain   Neck - Pain   HPI: Michael Garcia. is a 65 y.o. male who comes in today For evaluation and management of acute onset on top of a chronic history of back pain.  He called [place] last week with abrupt onset of lower back pain with some referral in the left leg and thigh and groin.  This occurred on Sunday of the prior week where he just bent over forward flexed and twisted and had abrupt onset of pain.  The pain was severe at the time and really he was really unable to move very much shifted to the increased pain.  He tried to manage this by calling his primary care physician Dr. Jonni Sanger as well as his neurologist.  The nurse practitioner for Dr. Jannifer Franklin, Vaughan Browner, FNP did increase his gabapentin to [facility] milligrams twice a day.  We sent in 50 mg steroid dose for 5 days.  We also sent in small amount of pain medication, tramadol.  I know the patient quite well from prior evaluations and management.  We discovered a schwannoma in his spine which she has had resected and did fairly well although had some neurogenic issues post surgery.  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  We have not seen him in a couple years.  He does have a complicated history of bipolar disorder and generalized anxiety disorder and chronic pain disorder.  He has had a history of schwannoma with resection but no other lumbar surgery.  This acute flareup has improved quite a bit since last week with the steroid dosing and movement.  He has had no focal weakness.  He has had some history of urinary and urological issues since have seen him and those notes can be reviewed.  He has had no new changes with that.  The low back pain is  more left-sided at this point.  More worse with standing.  Better at rest and sitting.  Its intermittent and sharp at times.  Secondary issue that he brings up today is upper back and neck pain with headaches.  He is followed by Dr. Jannifer Franklin for migraine and recalcitrant headache.  He again had his Neurontin or gabapentin increased by 200 mg twice a day.  Still on fairly low-dose of that he says he is tolerating it pretty well.  Has had prior cervical fusion.  No radicular pain.  Review of Systems  Musculoskeletal:  Positive for back pain, joint pain and neck pain.  Neurological:  Positive for headaches.  All other systems reviewed and are negative. Otherwise per HPI.  Assessment & Plan: Visit Diagnoses:    ICD-10-CM   1. Strain of lumbar region, initial encounter  S39.012A     2. Post laminectomy syndrome  M96.1     3. Chronic pain syndrome  G89.4     4. Schwannoma of spinal cord (HCC)  D33.4     5. Cervicalgia  M54.2     6. Status post cervical spinal fusion  Z98.1        Plan: Findings:  1.  Chronic history of severe neck and back pain but mostly I have seen him for his  lower back.  History of diagnosis of schwannoma status postresection with MRI from 2020 showing good resection with no recurrence and really minor issues of the lower spine but mainly facet arthropathy without stenosis.  His symptoms sound like what we would call a lumbar strain where he bent and twisted and had acute pain which can be very painful but tends not to be necessarily a severe issue from a pathology standpoint.  No reason for imaging today mostly low back pain on the left with radicular type pain potentially that was acute on onset but now resolved.  Could be trigger point referral pain as well.  Patient with generalized anxiety disorder and bipolar disorder.  At this point did pretty well with steroid medication for a few days and mild bit of tramadol.  Patient with some history of issues with alcohol in the  remote past.  No issue with pain medication recently.  I do feel like this will resolve on its own after the next few weeks with directed increase activity level and exercise.  If his symptoms do not resolve over the next few weeks we could look at potential for trigger point injection versus epidural injection.  Obviously would be quick to look at MRI.  2.  Chronic upper back and neck pain with history of headaches.  This is managed by neurology and Dr. Jannifer Franklin.  I did increase his gabapentin to see if it would help with his back and his other issues.  I increase this mainly because he was tolerating the lower dose so well and I did increase this to have him take 300 mg 3 times a day.  This can be reviewed by the neurologist and I feel like that would probably be something they would want to do anyway.  He will let us know if he is not tolerating the increase.  We talked him about how to do this slowly.  In terms of his upper back and neck pain he does have some myofascial trigger points.  He may benefit from focused physical therapy with dry needling.   Meds & Orders:  Meds ordered this encounter  Medications   gabapentin (NEURONTIN) 600 MG tablet    Sig: Take 0.5 tablets (300 mg total) by mouth 3 (three) times daily.    Dispense:  90 tablet    Refill:  1    Order Specific Question:   Supervising Provider    Answer:   Magnus Sinning [[id number]]   No orders of the defined types were placed in this encounter.   Follow-up: Return if symptoms worsen or fail to improve.   Procedures: No procedures performed      Clinical History: MRI LUMBAR SPINE WITHOUT AND WITH CONTRAST   INDICATION: Back pain or radiculopathy, prior surgery, new symptoms, s/p  T11-L1 laminoplasty for resection of intradural lesion, G95.89 Other  specified diseases of spinal cord (CMS-HCC)     COMPARISON: MRI lumbar spine 06/30/2018.   TECHNIQUE/PROTOCOL: Intradural protocol lumbar spine pre and post contrast  MRI  performed.   FINDINGS:  Unchanged postoperative appearance from prior T11-L1 laminoplasty.  Conus: The conus terminates at approximately T12-L1.  Spinal Cord and Cauda Equina: Stable postoperative morphology of the conus  and proximal cauda equina nerve roots from resection of prior intradural  mass.  Alignment: Normal.  Marrow Signal: Degenerative endplate marrow signal changes at L4-L5.  Epidural Hematoma: None.  Segmentation: Normal.  Vertebral Body Heights: Normal.  Sacroiliac Joints: Moderate degenerative changes.   Paraspinal  Soft Tissues: Mild fatty atrophy of the paraspinal musculature.  Unchanged postoperative scarring from T11-L1.  Retroperitoneal Soft Tissues: Retroaortic left renal vein.   T12-L1: Unremarkable.    L1-L2: Unremarkable.  L2-L3: Unremarkable.    L3-L4: Disc bulge without spinal canal stenosis or neural foraminal  narrowing.    L4-L5: Disc bulge and facet arthropathy contribute to mild right and  moderate left neural foraminal narrowing. No spinal canal stenosis.    L5-S1: Unremarkable.     IMPRESSION:  Unchanged postoperative appearance from prior T11-L1 laminoplasty and  resection of intradural tumor. No evidence of local recurrence.     Electronically Reviewed by:  Emmit Alexanders, MD, [facility]  Electronically Reviewed on:  01/01/2019 2:15 PM   I have reviewed the images and concur with the above findings.   Electronically Signed by:  Earney Mallet, MD, [facility]  Electronically Signed on:  01/01/2019 6:38 PM   He reports that he quit smoking about 28 years ago. His smoking use included cigarettes. He has never used smokeless tobacco. No results for input(s): HGBA1C, LABURIC in the last 8760 hours.  Objective:  VS:  HT:    WT:   BMI:     BP:115/77  HR:91bpm  TEMP: ( )  RESP:  Physical Exam Vitals and nursing note reviewed.  Constitutional:      General: He is not in acute distress.    Appearance: He is well-developed.   HENT:     Head: Normocephalic and atraumatic.  Eyes:     Conjunctiva/sclera: Conjunctivae normal.     Pupils: Pupils are equal, round, and reactive to light.  Neck:     Trachea: No tracheal deviation.  Cardiovascular:     Rate and Rhythm: Normal rate and regular rhythm.     Pulses: Normal pulses.  Pulmonary:     Effort: Pulmonary effort is normal.  Abdominal:     General: There is no distension.     Palpations: Abdomen is soft.     Tenderness: There is no guarding or rebound.  Musculoskeletal:        General: No deformity.     Cervical back: Neck supple. Tenderness present.     Right lower leg: No edema.     Left lower leg: No edema.     Comments: Examination of his lumbar spine and spine in general shows increased kyphosis of the thoracic spine with some increased lordosis of the lower spine.  No scoliosis noted.  Patient has pain with extension of the lumbar spine with rotation to the left and facet loading.  He has pain to palpation in the deep quadratus lumborum muscle on the left more than the right.  No focal tender points but focal trigger points.  No pain over the PSIS or greater trochanter.  No pain with hip rotation has good fluid movement of both hips.  He has good strength in both legs.  Examination of the cervical spine shows forward flexed cervical spine.  He has active trigger points in the levator scapula and trapezius bilaterally.  He has some limited range of motion at end ranges of rotation but negative Spurling's.  Lymphadenopathy:     Cervical: No cervical adenopathy.  Skin:    General: Skin is warm and dry.     Findings: No erythema or rash.  Neurological:     General: No focal deficit present.     Mental Status: He is alert and oriented to person, place, and time.  Cranial Nerves: No cranial nerve deficit.     Sensory: No sensory deficit.     Motor: No weakness or abnormal muscle tone.     Coordination: Coordination normal.     Gait: Gait normal.   Psychiatric:        Mood and Affect: Mood normal.        Behavior: Behavior normal.        Thought Content: Thought content normal.    Ortho Exam  Imaging: No results found.  Past Medical/Family/Surgical/Social History: Medications & Allergies reviewed per EMR, new medications updated. Patient Active Problem List   Diagnosis Date Noted   Bipolar 1 disorder, depressed ([place]) 07/25/2020   Colon polyps 07/25/2020   Stricture of anterior urethra in male 09/11/2018   Schwannoma of spinal cord ([place]) 04/22/2018   Benign non-nodular prostatic hyperplasia with lower urinary tract symptoms 11/21/2017   Erectile dysfunction due to arterial insufficiency 11/21/2017   Gastritis 72/07/4708   Lichen planus 62/83/6629   Migraine headache 11/21/2017   Nontoxic thyroid nodule 11/21/2017   Glaucoma 11/21/2017   Chronic myofascial pain 11/21/2017   Peyronie's disease 11/12/2016   Incomplete right bundle branch block 10/18/2016   GAD (generalized anxiety disorder) 03/29/2016   Insomnia, persistent 03/29/2016   Nondependent alcohol abuse, in remission 03/29/2016   Cervical spondylosis without myelopathy 04/29/2014   Status post cervical spinal fusion 04/29/2014   Cervical stenosis of spinal canal 11/12/2008   Past Medical History:  Diagnosis Date   Anxiety    Benign non-nodular prostatic hyperplasia with lower urinary tract symptoms 11/21/2017   Bipolar 1 disorder, depressed ([place]) 07/25/2020   Cervical spondylosis without myelopathy 04/29/2014   Cervical stenosis of spinal canal 11/12/2008   Chronic headaches    Chronic myofascial pain 11/21/2017   Colon polyps 07/25/2020   Colonoscopy 04/2020, [facility] [place], benign, recall in 5 years   Degenerative disc disease, cervical 04/29/2014   Erectile disorder, acquired, generalized, severe 11/21/2017   GAD (generalized anxiety disorder) 03/29/2016   Gastro-esophageal reflux disease with esophagitis 11/21/2017   Glaucoma 11/21/2017   Incomplete right bundle  branch block 10/18/2016   Insomnia, persistent 4/76/5465   Lichen planus, penis 11/21/2017   Nondependent alcohol abuse, in remission 03/29/2016   Nonintractable migraine, unspecified migraine type 11/21/2017   Peyronie disease 11/12/2016   Status post cervical spinal fusion 04/29/2014   Thyroid nodule 11/21/2017   benign by FNA 04/2020   Family History  Problem Relation Age of Onset   Heart disease Mother    Irritable bowel syndrome Mother    Diabetes Sister    Neuropathy Daughter    Migraines Daughter    Healthy Son    Past Surgical History:  Procedure Laterality Date   CERVICAL SPINE SURGERY  2010   c6-c7 fusion   HERNIA REPAIR     Social History   Occupational History   Occupation: Psychologist, 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

## 2021-04-12 NOTE — Progress Notes (Signed)
Left sided low back pain. Had some weakness in groin area for a short time. Had some pain in left thigh.  Upper back and neck pain with headaches. This is more on the right.Sees Dr. Jannifer Franklin currently for headaches.  Numeric Pain Rating Scale and Functional Assessment Average Pain 8 Pain Right Now 4 My pain is intermittent and sharp Pain is worse with: standing Pain improves with: rest and medication   In the last MONTH (on 0-10 scale) has pain interfered with the following?  1. General activity like being  able to carry out your everyday physical activities such as walking, climbing stairs, carrying groceries, or moving a chair?  Rating(10)  2. Relation with others like being able to carry out your usual social activities and roles such as  activities at home, at work and in your community. Rating(10)  3. Enjoyment of life such that you have  been bothered by emotional problems such as feeling anxious, depressed or irritable?  Rating(7)

## 2021-04-24 ENCOUNTER — Encounter: Payer: Self-pay | Admitting: Physical Medicine and Rehabilitation

## 2021-05-10 ENCOUNTER — Encounter: Payer: Managed Care, Other (non HMO) | Admitting: Family Medicine

## 2021-05-10 ENCOUNTER — Encounter: Payer: BC Managed Care – PPO | Admitting: Family Medicine

## 2021-05-14 ENCOUNTER — Other Ambulatory Visit: Payer: Self-pay | Admitting: Family Medicine

## 2021-05-16 MED ORDER — ALFUZOSIN HCL ER 10 MG PO TB24: 10.0000 mg | ORAL_TABLET | Freq: Every day | ORAL | 0 refills | Status: DC

## 2021-06-15 ENCOUNTER — Ambulatory Visit (INDEPENDENT_AMBULATORY_CARE_PROVIDER_SITE_OTHER): Payer: BC Managed Care – PPO | Admitting: Adult Health

## 2021-06-15 ENCOUNTER — Encounter: Payer: Self-pay | Admitting: Adult Health

## 2021-06-15 ENCOUNTER — Telehealth: Payer: Self-pay

## 2021-06-15 VITALS — BP 132/80 | HR 96 | Ht 76.0 in | Wt 256.0 lb

## 2021-06-15 DIAGNOSIS — R202 Paresthesia of skin: Secondary | ICD-10-CM

## 2021-06-15 DIAGNOSIS — G43709 Chronic migraine without aura, not intractable, without status migrainosus: Secondary | ICD-10-CM | POA: Diagnosis not present

## 2021-06-15 MED ORDER — EMGALITY 120 MG/ML ~~LOC~~ SOAJ: 120.0000 mg | SUBCUTANEOUS | 11 refills | Status: DC

## 2021-06-15 NOTE — Telephone Encounter (Signed)
Clinical questions answered and submitted.   KeyKellie Garcia - PA Case ID: [id number]

## 2021-06-15 NOTE — Progress Notes (Signed)
I have read the note, and I agree with the clinical assessment and plan.  Michael Garcia   

## 2021-06-15 NOTE — Patient Instructions (Signed)
Your Plan:  Stop Ajovy Start Emgality  Continue Gabapentin If your symptoms worsen or you develop new symptoms please let us know.   Thank you for coming to see [place] at [facility]. I hope we have been able to provide you high quality care today.  You may receive a patient satisfaction survey over the next few weeks. We would appreciate your feedback and comments so that we may continue to improve ourselves and the health of our patients.

## 2021-06-15 NOTE — Progress Notes (Signed)
PATIENT: Michael Garcia. DOB: 05/23/1956  REASON FOR VISIT: follow up HISTORY FROM: patient Primary neurologist: Dr. Jannifer Franklin  HISTORY OF PRESENT ILLNESS: Today 06/15/21:  Mr. Cober is a 65 year old male with a history of migraine headaches and paresthesias in the lower extremities.  He returns today for follow-up.  The patient states that he has approximately 6-7 breakthrough headaches a month.  He is currently on Ajovy and Maxalt.  He states that headaches always occur on the right side.  Usually coming up the back of the head and into the temporal region.  He reports that Maxalt typically works well for him.  He is open to trying another medication if it would give him better benefit.  Patient reports that Dr. Ernestina Patches increase his gabapentin to 300 mg 3 times a day.  He typically takes 600 in the morning and 300 at bedtime.  He notices midafternoon he has more discomfort in the legs.  He reports sharp shooting electric-like pain in the legs.  He reports that he had an exacerbation of his back pain and Dr. Ernestina Patches had to prescribe prednisone which offered him great benefit.   12/06/20: Mr. Debord is a 66 year old male with a history of migraine headaches.  He returns today for follow-up.  He is currently on Ajovy and Maxalt.  Reports that his headaches remain under good control.  States that he has approximately 7-8 migraine headaches a month.  He states the intensity and the longevity of the headache has improved.  He states that 90% of the time he takes Maxalt his headache will resolve in 1 to 2 hours.  Patient reports that since he stopped gabapentin he has noticed more discomfort in the feet.  He would like to restart gabapentin.  Denies any changes with his gait or balance.  He has done 2 rounds of physical therapy and thinks his balance has improved.  He does not use an assistive device when ambulating.    06/02/20: Mr. Frediani is a 65 year old male with a history of migraine  headaches.  He returns today for follow-up.  He is currently on Emgality and Maxalt.  He reports that he has a headache almost every other day.  The severity of his headaches vary.  He states that he can use Maxalt and that usually offer some benefit.  Typically uses at least 9 tablets a month.  He also uses ice and heat therapy.  He also has a compounded cream that he rubs on the back of his neck.  In the past he has been on Depakote and gabapentin.  Both of these medications offer good benefit with his headaches but he began having tremor and gait abnormalities.  He returns today for an evaluation.  HISTORY (copied from Dr. Tobey Grim note)  Mr. Riehl is a 65 year old right-handed white male with a history of migraine headaches.  He also has a history of a schwannoma resection from the conus medullaris, he is followed through [facility] for this.  He has migraine headaches mainly in the left occipital distribution, the combination of Emgality, Depakote, and gabapentin significantly reduced the headache frequency, he was only having about 2 headaches a month on this medication regimen.  However, the patient developed significant gait instability on the gabapentin and severe tremors of the upper extremities on Depakote.  He was forced to come off these medications and subsequently his headaches have worsened.  He believes that [employer] worked better [employer] that he is on currently.  He is now having 8 or 9 headache days a month, he may miss work on occasion because of this.  He is having ongoing problems with left leg discomfort secondary to the schwannoma residual.  He will be having MRI of the lumbar spine in the next month.  He has not had any further falls, his handwriting has gotten much better.  He returns for an evaluation.  Maxalt is effective in controlling the headache if he takes it early.  REVIEW OF SYSTEMS: Out of a complete 14 system review of symptoms, the patient complains only of  the following symptoms, and all other reviewed systems are negative.  See HPI  ALLERGIES: Allergies  Allergen Reactions   Bacitracin Itching and Rash   Iodine Itching and Rash    IV Iodine    Sulfamethoxazole-Trimethoprim Rash    HOME MEDICATIONS: Outpatient Medications Prior to Visit  Medication Sig Dispense Refill   alfuzosin (UROXATRAL) 10 MG 24 hr tablet Take 1 tablet (10 mg total) by mouth daily with breakfast. 90 tablet 0   ALPRAZolam (XANAX) 1 MG tablet Take 1 mg by mouth as needed. Take 1/2 tablet twice daily and 2 tablets at bedtime.  2   buPROPion (WELLBUTRIN XL) 300 MG 24 hr tablet Take 300 mg by mouth at bedtime.     cetirizine (ZYRTEC) 10 MG tablet Take 1 tablet by mouth daily.     esomeprazole (NEXIUM) 40 MG capsule TAKE 1 CAPSULE(40 MG) BY MOUTH DAILY 30 capsule 0   FIBER PO Take by mouth.     fluticasone (FLONASE) 50 MCG/ACT nasal spray INSTILL 2 SPRAYS IN EACH NOSTRIL EVERY DAY 16 g 5   Fremanezumab-vfrm (AJOVY) 225 MG/1.5ML SOAJ INJECT THE CONTENTS OF 1 INJECTOR UNDER THE SKIN EVERY 30 DAYS 1.5 mL 4   gabapentin (NEURONTIN) 600 MG tablet Take 0.5 tablets (300 mg total) by mouth 3 (three) times daily. 90 tablet 1   lamoTRIgine (LAMICTAL) 150 MG tablet Take 150 mg by mouth 2 (two) times daily.     lidocaine (LIDODERM) 5 % USE AS DIRECTED ONCE DAILY EXTERNALLY 30 patch 2   MULTIPLE VITAMIN PO Take by mouth.     predniSONE (DELTASONE) 50 MG tablet Take 1 tablet daily with food for 5 days until finished 5 tablet 0   QUEtiapine Fumarate (SEROQUEL PO) Take 300 mg by mouth.     rizatriptan (MAXALT) 10 MG tablet TAKE 1 TABLET BY MOUTH at the onset of migraine- repeat in 2 hours if needed. only 2tabs/24 hours. 10 tablet 5   tacrolimus (PROTOPIC) 0.1 % ointment in the morning and at bedtime.     No facility-administered medications prior to visit.    PAST MEDICAL HISTORY: Past Medical History:  Diagnosis Date   Anxiety    Benign non-nodular prostatic hyperplasia with  lower urinary tract symptoms 11/21/2017   Bipolar 1 disorder, depressed ([place]) 07/25/2020   Cervical spondylosis without myelopathy 04/29/2014   Cervical stenosis of spinal canal 11/12/2008   Chronic headaches    Chronic myofascial pain 11/21/2017   Colon polyps 07/25/2020   Col[place]oscopy 04/2020, [place], benign, recall in 5 years   Degenerative disc disease, cervical 04/29/2014   Erectile disorder, acquired, generalized, severe 11/21/2017   GAD (generalized anxiety disorder) 03/29/2016   Gastro-esophageal reflux disease with esophagitis 11/21/2017   Glaucoma 11/21/2017   Incomplete right bundle branch block 10/18/2016   Insomnia, persistent [id number]   Lichen planus, penis 11/21/2017   Nondependent alcohol abuse, in remission 03/29/2016  Nonintractable migraine, unspecified migraine type 11/21/2017   Peyronie disease 11/12/2016   Status post cervical spinal fusion 04/29/2014   Thyroid nodule 11/21/2017   benign by FNA 04/2020    PAST SURGICAL HISTORY: Past Surgical History:  Procedure Laterality Date   CERVICAL SPINE SURGERY  2010   c6-c7 fusion   HERNIA REPAIR      FAMILY HISTORY: Family History  Problem Relation Age of Onset   Heart disease Mother    Irritable bowel syndrome Mother    Diabetes Sister    Neuropathy Daughter    Migraines Daughter    Healthy Son     SOCIAL HISTORY: Social History   Socioeconomic History   Marital status: Married    Spouse name: Not on file   Number of children: 2   Years of education: Not on file   Highest education level: Not on file  Occupational History   Occupation: Psychologist, counselling in [place]  Tobacco Use   Smoking status: Former    Types: Cigarettes    Quit date: 1994    Years since quitting: 28.6   Smokeless tobacco: Never  Vaping Use   Vaping Use: Never used  Substance and Sexual Activity   Alcohol use: No   Drug use: No   Sexual activity: Yes  Other Topics Concern   Not on file  Social History Narrative   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 Identifies as gay, came out at age 88; remains married to wife.    Social Determinants of Health   Financial Resource Strain: Not on file  Food Insecurity: Not on file  Transportation Needs: Not on file  Physical Activity: Not on file  Stress: Not on file  Social Connections: Not on file  Intimate Partner Violence: Not on file      PHYSICAL EXAM  Vitals:   06/15/21 0750  BP: 132/80  Pulse: 96  Weight: 256 lb (116.1 kg)  Height: '6\' 4"'$  (1.93 m)   Body mass index is 31.16 kg/m.  Generalized: Well developed, in no acute distress   Neurological examination  Mentation: Alert oriented to time, place, history taking. Follows all commands speech and language fluent Cranial nerve II-XII: Pupils were equal round reactive to light. Extraocular movements were full, visual field were full on confrontational test.  Head turning and shoulder shrug  were normal and symmetric. Motor: The motor testing reveals 5 over 5 strength of all 4 extremities. Good symmetric motor tone is noted throughout.  Sensory: Sensory testing is intact to soft touch on all 4 extremities. No evidence of extinction is noted.  Coordination: Cerebellar testing reveals good finger-nose-finger and heel-to-shin bilaterally.  Gait and station: Gait is normal.  Reflexes: Deep tendon reflexes are symmetric and normal bilaterally.   DIAGNOSTIC DATA (LABS, IMAGING, TESTING) - I reviewed patient records, labs, notes, testing and imaging myself where available.  Lab Results  Component Value Date   WBC 4.7 11/09/2020   HGB 13.2 11/09/2020   HCT 39.2 11/09/2020   MCV 92.9 11/09/2020   PLT 265.0 11/09/2020      Component Value Date/Time   NA 140 11/09/2020 1529   NA 138 09/14/2019 0929   K 3.9 11/09/2020 1529   CL 104 11/09/2020 1529   CO2 30 11/09/2020 1529   GLUCOSE 102 (H) 11/09/2020 1529   BUN 11 11/09/2020 1529   BUN 8 09/14/2019 0929   CREATININE 1.06 11/09/2020 1529    CALCIUM 9.2 11/09/2020 1529   PROT 7.4 11/09/2020  1529   PROT 7.8 09/14/2019 0929   ALBUMIN 4.8 11/09/2020 1529   ALBUMIN 5.0 (H) 09/14/2019 0929   AST 18 11/09/2020 1529   ALT 17 11/09/2020 1529   ALKPHOS 72 11/09/2020 1529   BILITOT 0.3 11/09/2020 1529   BILITOT 0.4 09/14/2019 0929   GFRNONAA >60 07/08/2020 1653   GFRAA >60 07/08/2020 1653   Lab Results  Component Value Date   CHOL 164 05/05/2018   HDL 55.40 05/05/2018   LDLCALC 91 05/05/2018   TRIG 88.0 05/05/2018   CHOLHDL 3 05/05/2018   Lab Results  Component Value Date   HGBA1C 5.3 10/23/2017   Lab Results  Component Value Date   VITAMINB12 277 04/18/2018   Lab Results  Component Value Date   TSH 3.12 11/09/2020      ASSESSMENT AND PLAN 65 y.o. year old male  has a past medical history of Anxiety, Benign non-nodular prostatic hyperplasia with lower urinary tract symptoms (11/21/2017), Bipolar 1 disorder, depressed ([place]) (07/25/2020), Cervical spondylosis without myelopathy (04/29/2014), Cervical stenosis of spinal canal (11/12/2008), Chronic headaches, Chronic myofascial pain (11/21/2017), Colon polyps (07/25/2020), Degenerative disc disease, cervical (04/29/2014), Erectile disorder, acquired, generalized, severe (11/21/2017), GAD (generalized anxiety disorder) (03/29/2016), Gastro-esophageal reflux disease with esophagitis (11/21/2017), Glaucoma (11/21/2017), Incomplete right bundle branch block (10/18/2016), Insomnia, persistent (A999333), Lichen planus, penis (11/21/2017), Nondependent alcohol abuse, in remission (03/29/2016), Nonintractable migraine, unspecified migraine type (11/21/2017), Peyronie disease (11/12/2016), Status post cervical spinal fusion (04/29/2014), and Thyroid nodule (11/21/2017). here with :  Migraine headaches  Stop Ajovy  Start Emgality monthly injection  Continue Maxalt for abortive therapy    Paresthesias in the lower extremities  Continue gabapentin 300 mg 3 times a day  Advised if his symptoms  worsen or he develops new symptoms he should let us know.  He will follow-up in 1 year or sooner if needed   Ward Givens, MSN, NP-C 06/15/2021, 8:11 AM [facility] [address], [place], [place] [address] [phone]

## 2021-06-15 NOTE — Telephone Encounter (Signed)
Submitted a PA request for [employer] on CMM, Key: [id number].   Awaiting additional clinical questions.

## 2021-06-18 ENCOUNTER — Other Ambulatory Visit: Payer: Self-pay | Admitting: Adult Health

## 2021-06-19 NOTE — Telephone Encounter (Signed)
Received approval from [facility]. Emgality approved 06/15/21-09/15/21. Fax sent to pharmacy. Received a receipt of confirmation.

## 2021-06-21 ENCOUNTER — Encounter: Payer: Self-pay | Admitting: Family Medicine

## 2021-06-22 ENCOUNTER — Other Ambulatory Visit: Payer: Self-pay

## 2021-06-22 ENCOUNTER — Ambulatory Visit (INDEPENDENT_AMBULATORY_CARE_PROVIDER_SITE_OTHER): Payer: BC Managed Care – PPO | Admitting: Family Medicine

## 2021-06-22 ENCOUNTER — Encounter: Payer: Self-pay | Admitting: Family Medicine

## 2021-06-22 VITALS — BP 114/73 | HR 79 | Temp 97.9°F | Ht 76.0 in | Wt 252.8 lb

## 2021-06-22 DIAGNOSIS — F319 Bipolar disorder, unspecified: Secondary | ICD-10-CM | POA: Diagnosis not present

## 2021-06-22 DIAGNOSIS — M19042 Primary osteoarthritis, left hand: Secondary | ICD-10-CM

## 2021-06-22 DIAGNOSIS — Z79899 Other long term (current) drug therapy: Secondary | ICD-10-CM | POA: Diagnosis not present

## 2021-06-22 DIAGNOSIS — Z0000 Encounter for general adult medical examination without abnormal findings: Secondary | ICD-10-CM

## 2021-06-22 DIAGNOSIS — F411 Generalized anxiety disorder: Secondary | ICD-10-CM | POA: Diagnosis not present

## 2021-06-22 DIAGNOSIS — M19041 Primary osteoarthritis, right hand: Secondary | ICD-10-CM

## 2021-06-22 LAB — POCT URINALYSIS DIPSTICK
Bilirubin, UA: NEGATIVE
Blood, UA: NEGATIVE
Glucose, UA: NEGATIVE
Ketones, UA: NEGATIVE
Leukocytes, UA: NEGATIVE
Nitrite, UA: NEGATIVE
Protein, UA: NEGATIVE
Spec Grav, UA: 1.01 (ref 1.010–1.025)
Urobilinogen, UA: 0.2 E.U./dL
pH, UA: 7 (ref 5.0–8.0)

## 2021-06-22 LAB — CBC WITH DIFFERENTIAL/PLATELET
Basophils Absolute: 0.1 10*3/uL (ref 0.0–0.1)
Basophils Relative: 1.1 % (ref 0.0–3.0)
Eosinophils Absolute: 0.3 10*3/uL (ref 0.0–0.7)
Eosinophils Relative: 5.5 % — ABNORMAL HIGH (ref 0.0–5.0)
HCT: 40.3 % (ref 39.0–52.0)
Hemoglobin: 13.8 g/dL (ref 13.0–17.0)
Lymphocytes Relative: 33 % (ref 12.0–46.0)
Lymphs Abs: 1.6 10*3/uL (ref 0.7–4.0)
MCHC: 34.1 g/dL (ref 30.0–36.0)
MCV: 92.9 fl (ref 78.0–100.0)
Monocytes Absolute: 0.4 10*3/uL (ref 0.1–1.0)
Monocytes Relative: 8.7 % (ref 3.0–12.0)
Neutro Abs: 2.5 10*3/uL (ref 1.4–7.7)
Neutrophils Relative %: 51.7 % (ref 43.0–77.0)
Platelets: 248 10*3/uL (ref 150.0–400.0)
RBC: 4.34 Mil/uL (ref 4.22–5.81)
RDW: 13.4 % (ref 11.5–15.5)
WBC: 4.8 10*3/uL (ref 4.0–10.5)

## 2021-06-22 LAB — COMPREHENSIVE METABOLIC PANEL
ALT: 16 U/L (ref 0–53)
AST: 19 U/L (ref 0–37)
Albumin: 4.7 g/dL (ref 3.5–5.2)
Alkaline Phosphatase: 67 U/L (ref 39–117)
BUN: 8 mg/dL (ref 6–23)
CO2: 30 mEq/L (ref 19–32)
Calcium: 9.6 mg/dL (ref 8.4–10.5)
Chloride: 103 mEq/L (ref 96–112)
Creatinine, Ser: 1.1 mg/dL (ref 0.40–1.50)
GFR: 71.63 mL/min (ref >60.00)
Glucose, Bld: 95 mg/dL (ref 70–99)
Potassium: 4.3 mEq/L (ref 3.5–5.1)
Sodium: 141 mEq/L (ref 135–145)
Total Bilirubin: 0.5 mg/dL (ref 0.2–1.2)
Total Protein: 7.7 g/dL (ref 6.0–8.3)

## 2021-06-22 LAB — LIPID PANEL
Cholesterol: 188 mg/dL (ref 0–200)
HDL: 51.5 mg/dL (ref >39.00)
LDL Cholesterol: 109 mg/dL — ABNORMAL HIGH (ref 0–99)
NonHDL: 136.26
Total CHOL/HDL Ratio: 4
Triglycerides: 138 mg/dL (ref 0.0–149.0)
VLDL: 27.6 mg/dL (ref 0.0–40.0)

## 2021-06-22 LAB — HEMOGLOBIN A1C: Hgb A1c MFr Bld: 5.8 % (ref 4.6–6.5)

## 2021-06-22 LAB — TSH: TSH: 2.73 u[IU]/mL (ref 0.35–5.50)

## 2021-06-22 NOTE — Patient Instructions (Signed)
Please return in 12 months for your annual complete physical; please come fasting.   I will release your lab results to you on your MyChart account with further instructions. Please reply with any questions.    If you have any questions or concerns, please don't hesitate to send me a message via MyChart or call the office at [phone]. Thank you for visiting with [place] today! It's our pleasure caring for you.   Please do these things to maintain good health!  Exercise at least 30-45 minutes a day,  4-5 days a week.  Eat a low-fat diet with lots of fruits and vegetables, up to 7-9 servings per day. Drink plenty of water daily. Try to drink 8 8oz glasses per day. Seatbelts can save your life. Always wear your seatbelt. Place Smoke Detectors on every level of your home and check batteries every year. Eye Doctor - have an eye exam every 1-2 years Safe sex - use condoms to protect yourself from STDs if you could be exposed to these types of infections. Avoid heavy alcohol use. If you drink, keep it to less than 2 drinks/day and not every day. [place].  Choose someone you trust that could speak for you if you became unable to speak for yourself. Depression is common in our stressful world.If you're feeling down or losing interest in things you normally enjoy, please come in for a visit.

## 2021-06-22 NOTE — Progress Notes (Signed)
Subjective  Chief Complaint  Patient presents with   Annual Exam    Patient fasting today    Hand Pain    Bilateral, worst on Rt     HPI: Michael Garcia. is a 65 y.o. male who presents to [place] today for a Male Wellness Visit. He also has the concerns and/or needs as listed above in the chief complaint. These will be addressed in addition to the Health Maintenance Visit.   Wellness Visit: annual visit with health maintenance review and exam   65 year old male for cpe: HM: Screens immunizations are up-to-date.  He has no new concerns.  He has gained weight over the last year but otherwise reports he is doing very well.  Body mass index is 30.77 kg/m. Wt Readings from Last 3 Encounters:  06/22/21 252 lb 12.8 oz (114.7 kg)  06/15/21 256 lb (116.1 kg)  12/06/20 250 lb (113.4 kg)     Chronic disease management visit and/or acute problem visit: Mood disorder: Managed by psychiatry.  Recently had TCM which worked modestly.  Current medical regimen is stable.  He is on Seroquel. Chronic pain, osteoarthritis, neck pain: All stable. Other medical problems managed by specialist.  No new concerns per patient.  Patient Active Problem List   Diagnosis Date Noted   Bipolar 1 disorder, depressed ([place]) 07/25/2020   Schwannoma of spinal cord ([place]) 04/22/2018   Migraine headache 11/21/2017   Chronic myofascial pain 11/21/2017   GAD (generalized anxiety disorder) 03/29/2016   Cervical spondylosis without myelopathy 04/29/2014   Cervical stenosis of spinal canal 11/12/2008   Colon polyps 07/25/2020   Benign non-nodular prostatic hyperplasia with lower urinary tract symptoms 11/21/2017   Gastritis 11/21/2017   Incomplete right bundle branch block 10/18/2016   Insomnia, persistent 03/29/2016   Erectile dysfunction due to arterial insufficiency [id number]   Lichen planus [id number]   Nontoxic thyroid nodule 11/21/2017   Glaucoma 11/21/2017   Peyronie's  disease 11/12/2016   Nondependent alcohol abuse, in remission 03/29/2016   Status post cervical spinal fusion 04/29/2014   Stricture of anterior urethra in male 09/11/2018   Health Maintenance  Topic Date Due   Pneumococcal Vaccine 20-40 Years old (2 - PCV) 05/17/2019   INFLUENZA VACCINE  06/12/2021   COVID-19 Vaccine (5 - Booster for Moderna series) 08/22/2021   COLONOSCOPY (Pts 45-43yr Insurance coverage will need to be confirmed)  03/03/2025   TETANUS/TDAP  08/06/2027   Hepatitis C Screening  Completed   HIV Screening  Completed   Zoster Vaccines- Shingrix  Completed   HPV VACCINES  Aged Out   Immunization History  Administered Date(s) Administered   Hepatitis A 10/23/2016, 04/24/2017   Influenza Split 11/22/2015   Influenza,inj,Quad PF,6+ Mos 07/21/2018, 07/25/2020   Influenza-Unspecified 09/24/2016, 10/23/2017   Moderna Sars-Covid-2 Vaccination 11/19/2019, 12/18/2019, 08/27/2020, 04/22/2021   Pneumococcal Polysaccharide-23 05/16/2018   Tdap 08/05/2017   Zoster Recombinat (Shingrix) 07/25/2020, 02/01/2021   We updated and reviewed the patient's past history in detail and it is documented below. Allergies: Patient is allergic to bacitracin, iodine, and sulfamethoxazole-trimethoprim. Past Medical History  has a past medical history of Anxiety, Benign non-nodular prostatic hyperplasia with lower urinary tract symptoms (11/21/2017), Bipolar 1 disorder, depressed ([place] (07/25/2020), Cervical spondylosis without myelopathy (04/29/2014), Cervical stenosis of spinal canal (11/12/2008), Chronic headaches, Chronic myofascial pain (11/21/2017), Colon polyps (07/25/2020), Degenerative disc disease, cervical (04/29/2014), Erectile disorder, acquired, generalized, severe (11/21/2017), GAD (generalized anxiety disorder) (03/29/2016), Gastro-esophageal reflux disease with esophagitis (11/21/2017), Glaucoma (11/21/2017),  Incomplete right bundle branch block (10/18/2016), Insomnia, persistent ([id number]),  Lichen planus, penis (11/21/2017), Nondependent alcohol abuse, in remission (03/29/2016), Nonintractable migraine, unspecified migraine type (11/21/2017), Peyronie disease (11/12/2016), Status post cervical spinal fusion (04/29/2014), and Thyroid nodule (11/21/2017). Past Surgical History Patient  has a past surgical history that includes Hernia repair and Cervical spine surgery (2010). Social History Patient  reports that he quit smoking about 28 years ago. His smoking use included cigarettes. He has never used smokeless tobacco. He reports that he does not drink alcohol and does not use drugs. Family History family history includes Diabetes in his sister; Healthy in his son; Heart disease in his mother; Irritable bowel syndrome in his mother; Migraines in his daughter; Neuropathy in his daughter. Review of Systems: Constitutional: negative for fever or malaise Ophthalmic: negative for photophobia, double vision or loss of vision Cardiovascular: negative for chest pain, dyspnea on exertion, or new LE swelling Respiratory: negative for SOB or persistent cough Gastrointestinal: negative for abdominal pain, change in bowel habits or melena Genitourinary: negative for dysuria or gross hematuria Musculoskeletal: negative for new gait disturbance or muscular weakness Integumentary: negative for new or persistent rashes Neurological: negative for TIA or stroke symptoms Psychiatric: negative for SI or delusions Allergic/Immunologic: negative for hives  Patient Care Team    Relationship Specialty Notifications Start End  Leamon Arnt, MD PCP - General Family Medicine  07/25/20   Magnus Sinning, MD Consulting Physician Physical Medicine and Rehabilitation  04/17/18   Alexis Frock, MD Consulting Physician Urology  07/22/18   Chauncey Mann, MD Referring Physician Psychiatry  11/18/18   Kathrynn Ducking, MD Consulting Physician Neurology  07/25/20   Wonda Horner, MD Referring Physician  Neurosurgery  08/01/20    Objective  Vitals: BP 114/73   Pulse 79   Temp 97.9 F (36.6 C) (Temporal)   Ht '6\' 4"'$  (1.93 m)   Wt 252 lb 12.8 oz (114.7 kg)   SpO2 94%   BMI 30.77 kg/m  General:  Well developed, well nourished, no acute distress  Psych:  Alert and orientedx3,normal mood and affect HEENT:  Normocephalic, atraumatic, non-icteric sclera, PERRL, oropharynx is clear without mass or exudate, supple neck without adenopathy, mass or thyromegaly Cardiovascular:  Normal S1, S2, RRR without gallop, rub or murmur, nondisplaced PMI, +2 distal pulses in bilateral upper and lower extremities. Respiratory:  Good breath sounds bilaterally, CTAB with normal respiratory effort Gastrointestinal: normal bowel sounds, soft, non-tender, no noted masses. No HSM MSK: Right hand with distal osteoarthritic changes, no contusions. Joints are without erythema or swelling. Spine and CVA region are nontender Skin:  Warm, no rashes or suspicious lesions noted Neurologic:    Mental status is normal. CN 2-11 are normal. Gross motor and sensory exams are normal. Stable gait. No tremor GU: No inguinal hernias or adenopathy are appreciated bilaterally   Assessment  1. Annual physical exam   2. Bipolar 1 disorder, depressed ([place])   3. High risk medication use   4. GAD (generalized anxiety disorder)      Plan  Male Wellness Visit: Age appropriate Health Maintenance and Prevention measures were discussed with patient. Included topics are cancer screening recommendations, ways to keep healthy (see AVS) including dietary and exercise recommendations, regular eye and dental care, use of seat belts, and avoidance of moderate alcohol use and tobacco use.  BMI: discussed patient's BMI and encouraged positive lifestyle modifications to help get to or maintain a target BMI. HM needs and immunizations were addressed and  ordered. See below for orders. See HM and immunization section for updates.  Up-to-date Routine labs  and screening tests ordered including cmp, cbc and lipids where appropriate. Discussed recommendations regarding Vit D and calcium supplementation (see AVS)  Chronic disease f/u and/or acute problem visit: (deemed necessary to be done in addition to the wellness visit): Chronic problems are well controlled.  Reviewed medication list in detail.  No changes made today.  Follow up: 1 year for complete physical [facility] side effects, risks, benefits, and alternatives for medications and treatment plan prescribed today were discussed, and the patient expressed understanding of the given instructions. Patient is instructed to call or message via MyChart if he/she has any questions or concerns regarding our treatment plan. No barriers to understanding were identified. We discussed Red Flag symptoms and signs in detail. Patient expressed understanding regarding what to do in case of urgent or emergency type symptoms.  Medication list was reconciled, printed and provided to the patient in AVS. Patient instructions and summary information was reviewed with the patient as documented in the AVS. This note was prepared with assistance of Dragon voice recognition software. Occasional wrong-word or sound-a-like substitutions may have occurred due to the inherent limitations of voice recognition software  This visit occurred during the SARS-CoV-2 public health emergency.  Safety protocols were in place, including screening questions prior to the visit, additional usage of staff PPE, and extensive cleaning of exam room while observing appropriate contact time as indicated for disinfecting solutions.   Orders Placed This Encounter  Procedures   CBC with Differential/Platelet   Comprehensive metabolic panel   Lipid panel   TSH   Hemoglobin A1c   No orders of the defined types were placed in this encounter.

## 2021-06-27 ENCOUNTER — Encounter: Payer: Self-pay | Admitting: Family Medicine

## 2021-06-30 ENCOUNTER — Other Ambulatory Visit: Payer: Self-pay | Admitting: Physical Medicine and Rehabilitation

## 2021-06-30 NOTE — Telephone Encounter (Signed)
Please advise 

## 2021-08-13 ENCOUNTER — Other Ambulatory Visit: Payer: Self-pay | Admitting: Family Medicine

## 2021-08-28 DIAGNOSIS — F3132 Bipolar disorder, current episode depressed, moderate: Secondary | ICD-10-CM | POA: Diagnosis not present

## 2021-08-28 DIAGNOSIS — F411 Generalized anxiety disorder: Secondary | ICD-10-CM | POA: Diagnosis not present

## 2021-09-10 ENCOUNTER — Other Ambulatory Visit: Payer: Self-pay | Admitting: Family Medicine

## 2021-09-26 DIAGNOSIS — G4700 Insomnia, unspecified: Secondary | ICD-10-CM | POA: Diagnosis not present

## 2021-09-26 DIAGNOSIS — F3181 Bipolar II disorder: Secondary | ICD-10-CM | POA: Diagnosis not present

## 2021-09-26 DIAGNOSIS — F411 Generalized anxiety disorder: Secondary | ICD-10-CM | POA: Diagnosis not present

## 2021-10-03 ENCOUNTER — Encounter: Payer: Self-pay | Admitting: Family Medicine

## 2021-10-03 DIAGNOSIS — N486 Induration penis plastica: Secondary | ICD-10-CM | POA: Diagnosis not present

## 2021-10-03 DIAGNOSIS — N5319 Other ejaculatory dysfunction: Secondary | ICD-10-CM | POA: Diagnosis not present

## 2021-10-03 DIAGNOSIS — L439 Lichen planus, unspecified: Secondary | ICD-10-CM | POA: Diagnosis not present

## 2021-10-03 DIAGNOSIS — N35814 Other anterior urethral stricture, male: Secondary | ICD-10-CM | POA: Diagnosis not present

## 2021-10-03 DIAGNOSIS — F5232 Male orgasmic disorder: Secondary | ICD-10-CM | POA: Diagnosis not present

## 2021-10-03 DIAGNOSIS — N528 Other male erectile dysfunction: Secondary | ICD-10-CM | POA: Diagnosis not present

## 2021-10-09 DIAGNOSIS — F411 Generalized anxiety disorder: Secondary | ICD-10-CM | POA: Diagnosis not present

## 2021-10-09 DIAGNOSIS — G4700 Insomnia, unspecified: Secondary | ICD-10-CM | POA: Diagnosis not present

## 2021-10-09 DIAGNOSIS — F3181 Bipolar II disorder: Secondary | ICD-10-CM | POA: Diagnosis not present

## 2021-10-17 ENCOUNTER — Other Ambulatory Visit: Payer: Self-pay

## 2021-10-17 ENCOUNTER — Ambulatory Visit: Payer: BC Managed Care – PPO | Admitting: Family Medicine

## 2021-10-17 ENCOUNTER — Encounter: Payer: Self-pay | Admitting: Family Medicine

## 2021-10-17 VITALS — BP 140/88 | HR 72 | Temp 98.0°F | Ht 76.0 in | Wt 256.0 lb

## 2021-10-17 DIAGNOSIS — F411 Generalized anxiety disorder: Secondary | ICD-10-CM | POA: Diagnosis not present

## 2021-10-17 DIAGNOSIS — R7303 Prediabetes: Secondary | ICD-10-CM | POA: Diagnosis not present

## 2021-10-17 DIAGNOSIS — C720 Malignant neoplasm of spinal cord: Secondary | ICD-10-CM | POA: Insufficient documentation

## 2021-10-17 DIAGNOSIS — E782 Mixed hyperlipidemia: Secondary | ICD-10-CM | POA: Diagnosis not present

## 2021-10-17 DIAGNOSIS — F3181 Bipolar II disorder: Secondary | ICD-10-CM | POA: Diagnosis not present

## 2021-10-17 DIAGNOSIS — D334 Benign neoplasm of spinal cord: Secondary | ICD-10-CM

## 2021-10-17 DIAGNOSIS — G9589 Other specified diseases of spinal cord: Secondary | ICD-10-CM | POA: Insufficient documentation

## 2021-10-17 MED ORDER — ROSUVASTATIN CALCIUM 10 MG PO TABS: 10.0000 mg | ORAL_TABLET | Freq: Every day | ORAL | 3 refills | Status: DC

## 2021-10-17 NOTE — Patient Instructions (Signed)
Please return in 3 months to recheck your sugars.   Please start crestor 1 pill nightly for your cholesterol.  Look up some plant based recipes. Try new things; you may even like them! :)  If you have any questions or concerns, please don't hesitate to send me a message via MyChart or call the office at [phone]. Thank you for visiting with [place] today! It's our pleasure caring for you.  Prediabetes Prediabetes is when your blood sugar (blood glucose) level is higher than normal but not high enough for you to be diagnosed with type 2 diabetes. Having prediabetes puts you at risk for developing type 2 diabetes (type 2 diabetes mellitus). With certain lifestyle changes, you may be able to prevent or delay the onset of type 2 diabetes. This is important because type 2 diabetes can lead to serious complications, such as: Heart disease. Stroke. Blindness. Kidney disease. Depression. Poor circulation in the feet and legs. In severe cases, this could lead to surgical removal of a leg (amputation). What are the causes? The exact cause of prediabetes is not known. It may result from insulin resistance. Insulin resistance develops when cells in the body do not respond properly to insulin that the body makes. This can cause excess glucose to build up in the blood. High blood glucose (hyperglycemia) can develop. What increases the risk? The following factors may make you more likely to develop this condition: You have a family member with type 2 diabetes. You are older than 45 years. You had a temporary form of diabetes during a pregnancy (gestational diabetes). You had polycystic ovary syndrome (PCOS). You are overweight or obese. You are inactive (sedentary). You have a history of heart disease, including problems with cholesterol levels, high levels of blood fats, or high blood pressure. What are the signs or symptoms? You may have no symptoms. If you do have symptoms, they may include: Increased  hunger. Increased thirst. Increased urination. Vision changes, such as blurry vision. Tiredness (fatigue). How is this diagnosed? This condition can be diagnosed with blood tests. Your blood glucose may be checked with one or more of the following tests: A fasting blood glucose (FBG) test. You will not be allowed to eat (you will fast) for at least 8 hours before a blood sample is taken. An A1C blood test (hemoglobin A1C). This test provides information about blood glucose levels over the previous 2?3 months. An oral glucose tolerance test (OGTT). This test measures your blood glucose at two points in time: After fasting. This is your baseline level. Two hours after you drink a beverage that contains glucose. You may be diagnosed with prediabetes if: Your FBG is 100?125 mg/dL (5.6-6.9 mmol/L). Your A1C level is 5.7?6.4% (39-46 mmol/mol). Your OGTT result is 140?199 mg/dL (7.8-11 mmol/L). These blood tests may be repeated to confirm your diagnosis. How is this treated? Treatment may include dietary and lifestyle changes to help lower your blood glucose and prevent type 2 diabetes from developing. In some cases, medicine may be prescribed to help lower the risk of type 2 diabetes. Follow these instructions at home: Nutrition  Follow a healthy meal plan. This includes eating lean proteins, whole grains, legumes, fresh fruits and vegetables, low-fat dairy products, and healthy fats. Follow instructions from your health care provider about eating or drinking restrictions. Meet with a dietitian to create a healthy eating plan that is right for you. Lifestyle Do moderate-intensity exercise for at least 30 minutes a day on 5 or more days each  week, or as told by your health care provider. A mix of activities may be best, such as: Brisk walking, swimming, biking, and weight lifting. Lose weight as told by your health care provider. Losing 5-7% of your body weight can reverse insulin  resistance. Do not drink alcohol if: Your health care provider tells you not to drink. You are pregnant, may be pregnant, or are planning to become pregnant. If you drink alcohol: Limit how much you use to: 0-1 drink a day for women. 0-2 drinks a day for men. Be aware of how much alcohol is in your drink. In the [place] one drink equals one 12 oz bottle of beer (355 mL), one 5 oz glass of wine (148 mL), or one 1 oz glass of hard liquor (44 mL). General instructions Take over-the-counter and prescription medicines only as told by your health care provider. You may be prescribed medicines that help lower the risk of type 2 diabetes. Do not use any products that contain nicotine or tobacco, such as cigarettes, e-cigarettes, and chewing tobacco. If you need help quitting, ask your health care provider. Keep all follow-up visits. This is important. Where to find more information American Diabetes Association: www.diabetes.org Academy of Nutrition and Dietetics: www.eatright.org American Heart Association: www.heart.org Contact a health care provider if: You have any of these symptoms: Increased hunger. Increased urination. Increased thirst. Fatigue. Vision changes, such as blurry vision. Get help right away if you: Have shortness of breath. Feel confused. Vomit or feel like you may vomit. Summary Prediabetes is when your blood sugar (blood glucose)level is higher than normal but not high enough for you to be diagnosed with type 2 diabetes. Having prediabetes puts you at risk for developing type 2 diabetes (type 2 diabetes mellitus). Make lifestyle changes such as eating a healthy diet and exercising regularly to help prevent diabetes. Lose weight as told by your health care provider. This information is not intended to replace advice given to you by your health care provider. Make sure you discuss any questions you have with your health care provider. Document Revised: 01/28/2020 Document  Reviewed: 01/28/2020 Elsevier Patient Education  2022 [place].  Prediabetes Eating Plan Prediabetes is a condition that causes blood sugar (glucose) levels to be higher than normal. This increases the risk for developing type 2 diabetes (type 2 diabetes mellitus). Working with a health care provider or nutrition specialist (dietitian) to make diet and lifestyle changes can help prevent the onset of diabetes. These changes may help you: Control your blood glucose levels. Improve your cholesterol levels. Manage your blood pressure. What are tips for following this plan? Reading food labels Read food labels to check the amount of fat, salt (sodium), and sugar in prepackaged foods. Avoid foods that have: Saturated fats. Trans fats. Added sugars. Avoid foods that have more than 300 milligrams (mg) of sodium per serving. Limit your sodium intake to less than 2,300 mg each day. Shopping Avoid buying pre-made and processed foods. Avoid buying drinks with added sugar. Cooking Cook with olive oil. Do not use butter, lard, or ghee. Bake, broil, grill, steam, or boil foods. Avoid frying. Meal planning  Work with your dietitian to create an eating plan that is right for you. This may include tracking how many calories you take in each day. Use a food diary, notebook, or mobile application to track what you eat at each meal. Consider following a Mediterranean diet. This includes: Eating several servings of fresh fruits and vegetables each day. Eating  fish at least twice a week. Eating one serving each day of whole grains, beans, nuts, and seeds. Using olive oil instead of other fats. Limiting alcohol. Limiting red meat. Using nonfat or low-fat dairy products. Consider following a plant-based diet. This includes dietary choices that focus on eating mostly vegetables and fruit, grains, beans, nuts, and seeds. If you have high blood pressure, you may need to limit your sodium intake or follow a  diet such as the DASH (Dietary Approaches to Stop Hypertension) eating plan. The DASH diet aims to lower high blood pressure. Lifestyle Set weight loss goals with help from your health care team. It is recommended that most people with prediabetes lose 7% of their body weight. Exercise for at least 30 minutes 5 or more days a week. Attend a support group or seek support from a mental health counselor. Take over-the-counter and prescription medicines only as told by your health care provider. What foods are recommended? Fruits Berries. Bananas. Apples. Oranges. Grapes. Papaya. Mango. Pomegranate. Kiwi. Grapefruit. Cherries. Vegetables Lettuce. Spinach. Peas. Beets. Cauliflower. Cabbage. Broccoli. Carrots. Tomatoes. Squash. Eggplant. Herbs. Peppers. Onions. Cucumbers. Brussels sprouts. Grains Whole grains, such as whole-wheat or whole-grain breads, crackers, cereals, and pasta. Unsweetened oatmeal. Bulgur. Barley. Quinoa. Brown rice. Corn or whole-wheat flour tortillas or taco shells. Meats and other proteins Seafood. Poultry without skin. Lean cuts of pork and beef. Tofu. Eggs. Nuts. Beans. Dairy Low-fat or fat-free dairy products, such as yogurt, cottage cheese, and cheese. Beverages Water. Tea. Coffee. Sugar-free or diet soda. Seltzer water. Low-fat or nonfat milk. Milk alternatives, such as soy or almond milk. Fats and oils Olive oil. Canola oil. Sunflower oil. Grapeseed oil. Avocado. Walnuts. Sweets and desserts Sugar-free or low-fat pudding. Sugar-free or low-fat ice cream and other frozen treats. Seasonings and condiments Herbs. Sodium-free spices. Mustard. Relish. Low-salt, low-sugar ketchup. Low-salt, low-sugar barbecue sauce. Low-fat or fat-free mayonnaise. The items listed above may not be a complete list of recommended foods and beverages. Contact a dietitian for more information. What foods are not recommended? Fruits Fruits canned with syrup. Vegetables Canned vegetables.  Frozen vegetables with butter or cream sauce. Grains Refined white flour and flour products, such as bread, pasta, snack foods, and cereals. Meats and other proteins Fatty cuts of meat. Poultry with skin. Breaded or fried meat. Processed meats. Dairy Full-fat yogurt, cheese, or milk. Beverages Sweetened drinks, such as iced tea and soda. Fats and oils Butter. Lard. Ghee. Sweets and desserts Baked goods, such as cake, cupcakes, pastries, cookies, and cheesecake. Seasonings and condiments Spice mixes with added salt. Ketchup. Barbecue sauce. Mayonnaise. The items listed above may not be a complete list of foods and beverages that are not recommended. Contact a dietitian for more information. Where to find more information American Diabetes Association: www.diabetes.org Summary You may need to make diet and lifestyle changes to help prevent the onset of diabetes. These changes can help you control blood sugar, improve cholesterol levels, and manage blood pressure. Set weight loss goals with help from your health care team. It is recommended that most people with prediabetes lose 7% of their body weight. Consider following a Mediterranean diet. This includes eating plenty of fresh fruits and vegetables, whole grains, beans, nuts, seeds, fish, and low-fat dairy, and using olive oil instead of other fats. This information is not intended to replace advice given to you by your health care provider. Make sure you discuss any questions you have with your health care provider. Document Revised: 01/28/2020 Document Reviewed: 01/28/2020 Elsevier  Elsevier Patient Education  2022 Elsevier Inc.  

## 2021-10-17 NOTE — Progress Notes (Signed)
Subjective  CC:  Chief Complaint  Patient presents with   Lab Results    Discuss most recent labs, and find treatment    HPI: Michael Garcia. is a 65 y.o. male who presents to the office today for follow up of diabetes and problems listed above in the chief complaint.  Prediabetes by recent screening labs at urology: a1c 6.3. was 5.8 in august.  Admits diet has been worse, eats lots of starches, carbs, desserts.  Some of it has been comfort eating.  He has been struggling with worsening depressive symptoms.  Also related to medications: He had been on Seroquel.  This is recently been changed to [place].  Working with psychiatry to better manage his bipolar 2 disorder.  Depression has been moderate to severe.  Exercise is also difficult because of his depressive symptoms.  No symptoms of hyperglycemia.  No history of diabetes.  Brother and sister both have diabetes. Hyperlipidemia: Reviewed most recent lipid panel.  Elevated atherosclerotic risk low.  Has never been on a statin No h/o htn. Home readings are normal. He is anxious today.  Wt Readings from Last 3 Encounters:  10/17/21 256 lb (116.1 kg)  06/22/21 252 lb 12.8 oz (114.7 kg)  06/15/21 256 lb (116.1 kg)    BP Readings from Last 3 Encounters:  10/17/21 140/88  06/22/21 114/73  06/15/21 132/80    Assessment  1. Prediabetes   2. Bipolar 2 disorder ([place])   3. GAD (generalized anxiety disorder)   4. Mixed hyperlipidemia   5. Schwannoma of spinal cord ([facility])      Plan  PreDiabetes: new dx. educated on diagnosis, prognosis and ways to prevent progression to diabetes.  Discussed diet changes that are necessary.  We will reassess in 3 months. Bipolar disorder: Active.  Now on Latuda and Wellbutrin.  Can consider Caplyta.  Patient to follow-up with psychiatry.   Mixed hyperlipidemia: Discussed indications for statin.  Patient to start Crestor 10 mg nightly.  Recheck lipids at next visit.  Risk and benefits  discussed. Schwannoma of spinal cord with resultant weakness is stable.  Status post excision years ago  Follow up: 3 months to recheck diabetes and lipids. No orders of the defined types were placed in this encounter.  Meds ordered this encounter  Medications   rosuvastatin (CRESTOR) 10 MG tablet    Sig: Take 1 tablet (10 mg total) by mouth daily.    Dispense:  90 tablet    Refill:  3      Immunization History  Administered Date(s) Administered   Hepatitis A 10/23/2016, 04/24/2017   Influenza Split 11/22/2015   Influenza,inj,Quad PF,6+ Mos 07/21/2018, 07/25/2020   Influenza-Unspecified 09/24/2016, 10/23/2017, 08/23/2021   Moderna Covid-19 Vaccine Bivalent Booster 26yrs & up 08/23/2021   Moderna Sars-Covid-2 Vaccination 11/19/2019, 12/18/2019, 08/27/2020, 04/22/2021   Pneumococcal Polysaccharide-23 05/16/2018   Tdap 08/05/2017   Zoster Recombinat (Shingrix) 07/25/2020, 02/01/2021    Diabetes Related Lab Review: Lab Results  Component Value Date   HGBA1C 5.8 06/22/2021   HGBA1C 5.3 10/23/2017   HGBA1C 5.0 09/24/2016    No results found for: Derl Barrow Lab Results  Component Value Date   CREATININE 1.10 06/22/2021   BUN 8 06/22/2021   NA 141 06/22/2021   K 4.3 06/22/2021   CL 103 06/22/2021   CO2 30 06/22/2021   Lab Results  Component Value Date   CHOL 188 06/22/2021   CHOL 164 05/05/2018   CHOL 190 10/23/2017   Lab Results  Component Value Date   HDL 51.50 06/22/2021   HDL 55.40 05/05/2018   HDL 66 10/23/2017   Lab Results  Component Value Date   LDLCALC 109 (H) 06/22/2021   LDLCALC 91 05/05/2018   LDLCALC 107 10/23/2017   Lab Results  Component Value Date   TRIG 138.0 06/22/2021   TRIG 88.0 05/05/2018   TRIG 82 10/23/2017   TRIG 82 10/23/2017   Lab Results  Component Value Date   CHOLHDL 4 06/22/2021   CHOLHDL 3 05/05/2018   No results found for: LDLDIRECT The 10-year ASCVD risk score (Arnett DK, et al., 2019) is: 11.9%   Values  used to calculate the score:     Age: 17 years     Sex: Male     Is Non-Hispanic African American: No     Diabetic: No     Tobacco smoker: No     Systolic Blood Pressure: 876 mmHg     Is BP treated: No     HDL Cholesterol: 51.5 mg/dL     Total Cholesterol: 188 mg/dL I have reviewed the PMH, Fam and Soc history. Patient Active Problem List   Diagnosis Date Noted   Bipolar 2 disorder ([place]) 07/25/2020    Priority: High   Schwannoma of spinal cord ([place]) 04/22/2018    Priority: High    S/p excision; with residual left weakness and neuropathy    Migraine headache 11/21/2017    Priority: High   Chronic myofascial pain 11/21/2017    Priority: High   GAD (generalized anxiety disorder) 03/29/2016    Priority: High   Cervical spondylosis without myelopathy 04/29/2014    Priority: High   Cervical stenosis of spinal canal 11/12/2008    Priority: High   Colon polyps 07/25/2020    Priority: Medium     Colonoscopy 04/2020, [facility], benign, recall in 5 years    Benign non-nodular prostatic hyperplasia with lower urinary tract symptoms 11/21/2017    Priority: Medium    Gastritis 11/21/2017    Priority: Medium     By EGD 04/2020; h/o h.pylori and GERD. On chronic PPIs. Will need GI in [place]    Incomplete right bundle branch block 10/18/2016    Priority: Medium     Pt reported. Nl EKG 10/2020 here. Reportedly normal and ECHO in 2018 with [facility] in [place].     Insomnia, persistent 03/29/2016    Priority: Medium    Stricture of anterior urethra in male 09/11/2018    Priority: Low   Erectile dysfunction due to arterial insufficiency 11/21/2017    Priority: Low    Followed by [facility]  Stable, Chronic  App scheduled to discuss possible penile prosthesis with [facility]  per 06/12/14 note     Lichen planus 72/62/0355    Priority: Low    Lichen planus of the penis, improved - Cont Protopic 0.1% BID to the lesions  - Can consider applying after trauma (i.e. Sexual intercourse) to the area  to prevent koebnerization     Nontoxic thyroid nodule 11/21/2017    Priority: Low    Nontoxic, FNA 04/2020: benign nodule.    Glaucoma 11/21/2017    Priority: Low   Peyronie's disease 11/12/2016    Priority: Low    Followed by [facility]  Stable, chronic     Nondependent alcohol abuse, in remission 03/29/2016    Priority: Low   Status post cervical spinal fusion 04/29/2014    Priority: Low    Social History: Patient  reports that  he quit smoking about 28 years ago. His smoking use included cigarettes. He has never used smokeless tobacco. He reports that he does not drink alcohol and does not use drugs.  Review of Systems: Ophthalmic: negative for eye pain, loss of vision or double vision Cardiovascular: negative for chest pain Respiratory: negative for SOB or persistent cough Gastrointestinal: negative for abdominal pain Genitourinary: negative for dysuria or gross hematuria MSK: negative for foot lesions Neurologic: negative for weakness or gait disturbance  Objective  Vitals: BP 140/88   Pulse 72   Temp 98 F (36.7 C) (Temporal)   Ht 6\' 4"  (1.93 m)   Wt 256 lb (116.1 kg)   SpO2 96%   BMI 31.16 kg/m  General: well appearing, no acute distress  Psych:  Alert and oriented, normal mood and affect   Diabetic education: ongoing education regarding chronic disease management for diabetes was given today. We continue to reinforce the ABC's of diabetic management: A1c (<7 or 8 dependent upon patient), tight blood pressure control, and cholesterol management with goal LDL < 100 minimally. We discuss diet strategies, exercise recommendations, medication options and possible side effects. At each visit, we review recommended immunizations and preventive care recommendations for diabetics and stress that good diabetic control can prevent other problems. See below for this patient's data.   Commons side effects, risks, benefits, and alternatives for medications and treatment  plan prescribed today were discussed, and the patient expressed understanding of the given instructions. Patient is instructed to call or message via MyChart if he/she has any questions or concerns regarding our treatment plan. No barriers to understanding were identified. We discussed Red Flag symptoms and signs in detail. Patient expressed understanding regarding what to do in case of urgent or emergency type symptoms.  Medication list was reconciled, printed and provided to the patient in AVS. Patient instructions and summary information was reviewed with the patient as documented in the AVS. This note was prepared with assistance of Dragon voice recognition software. Occasional wrong-word or sound-a-like substitutions may have occurred due to the inherent limitations of voice recognition software  This visit occurred during the SARS-CoV-2 public health emergency.  Safety protocols were in place, including screening questions prior to the visit, additional usage of staff PPE, and extensive cleaning of exam room while observing appropriate contact time as indicated for disinfecting solutions.

## 2021-10-23 ENCOUNTER — Ambulatory Visit: Payer: BC Managed Care – PPO | Admitting: Family Medicine

## 2021-10-24 DIAGNOSIS — F411 Generalized anxiety disorder: Secondary | ICD-10-CM | POA: Diagnosis not present

## 2021-10-24 DIAGNOSIS — G4700 Insomnia, unspecified: Secondary | ICD-10-CM | POA: Diagnosis not present

## 2021-10-24 DIAGNOSIS — F3181 Bipolar II disorder: Secondary | ICD-10-CM | POA: Diagnosis not present

## 2021-10-25 ENCOUNTER — Telehealth: Payer: Self-pay | Admitting: *Deleted

## 2021-10-25 NOTE — Telephone Encounter (Signed)
[employer] PA, key [id number], [id number].

## 2021-10-26 ENCOUNTER — Encounter: Payer: Self-pay | Admitting: Adult Health

## 2021-10-26 NOTE — Telephone Encounter (Signed)
Your demographic data has been sent to [facility] successfully!  [employer] typically takes 5-10 minutes to respond, but it may take a little longer in some cases. You will be notified by email when available. You can also check for an update later by opening this request from your dashboard. Please do not fax or call [employer] to resubmit this request. If you need assistance, please chat with [employer] or call us at [phone].  If it has been longer than 24 hours, please reach out to [facility].

## 2021-10-26 NOTE — Telephone Encounter (Signed)
Answered clinical questions for [facility]. Your information has been submitted to [facility].  If [employer] has not responded to your request within 24 hours, contact [place] at [phone].

## 2021-10-30 NOTE — Telephone Encounter (Signed)
[employer] approved 10/26/2021 - 01/24/2022. Faxed approval to [employer].

## 2021-11-16 DIAGNOSIS — F411 Generalized anxiety disorder: Secondary | ICD-10-CM | POA: Diagnosis not present

## 2021-11-16 DIAGNOSIS — F3181 Bipolar II disorder: Secondary | ICD-10-CM | POA: Diagnosis not present

## 2021-12-25 DIAGNOSIS — D170 Benign lipomatous neoplasm of skin and subcutaneous tissue of head, face and neck: Secondary | ICD-10-CM | POA: Diagnosis not present

## 2021-12-25 DIAGNOSIS — L817 Pigmented purpuric dermatosis: Secondary | ICD-10-CM | POA: Diagnosis not present

## 2021-12-25 DIAGNOSIS — L439 Lichen planus, unspecified: Secondary | ICD-10-CM | POA: Diagnosis not present

## 2021-12-25 DIAGNOSIS — L821 Other seborrheic keratosis: Secondary | ICD-10-CM | POA: Diagnosis not present

## 2022-01-02 DIAGNOSIS — F411 Generalized anxiety disorder: Secondary | ICD-10-CM | POA: Diagnosis not present

## 2022-01-02 DIAGNOSIS — G4700 Insomnia, unspecified: Secondary | ICD-10-CM | POA: Diagnosis not present

## 2022-01-02 DIAGNOSIS — F3181 Bipolar II disorder: Secondary | ICD-10-CM | POA: Diagnosis not present

## 2022-01-03 DIAGNOSIS — H25813 Combined forms of age-related cataract, bilateral: Secondary | ICD-10-CM | POA: Diagnosis not present

## 2022-01-03 DIAGNOSIS — H5021 Vertical strabismus, right eye: Secondary | ICD-10-CM | POA: Diagnosis not present

## 2022-01-03 DIAGNOSIS — H40013 Open angle with borderline findings, low risk, bilateral: Secondary | ICD-10-CM | POA: Diagnosis not present

## 2022-01-15 ENCOUNTER — Encounter: Payer: Self-pay | Admitting: Family Medicine

## 2022-01-15 ENCOUNTER — Ambulatory Visit: Payer: BC Managed Care – PPO | Admitting: Family Medicine

## 2022-01-15 ENCOUNTER — Other Ambulatory Visit: Payer: Self-pay

## 2022-01-15 VITALS — BP 130/70 | HR 81 | Temp 97.8°F | Ht 76.0 in | Wt 232.6 lb

## 2022-01-15 DIAGNOSIS — F3181 Bipolar II disorder: Secondary | ICD-10-CM | POA: Diagnosis not present

## 2022-01-15 DIAGNOSIS — R7303 Prediabetes: Secondary | ICD-10-CM | POA: Diagnosis not present

## 2022-01-15 DIAGNOSIS — D334 Benign neoplasm of spinal cord: Secondary | ICD-10-CM

## 2022-01-15 DIAGNOSIS — E782 Mixed hyperlipidemia: Secondary | ICD-10-CM | POA: Insufficient documentation

## 2022-01-15 DIAGNOSIS — R310 Gross hematuria: Secondary | ICD-10-CM

## 2022-01-15 LAB — COMPREHENSIVE METABOLIC PANEL
ALT: 11 U/L (ref 0–53)
AST: 12 U/L (ref 0–37)
Albumin: 4.6 g/dL (ref 3.5–5.2)
Alkaline Phosphatase: 61 U/L (ref 39–117)
BUN: 6 mg/dL (ref 6–23)
CO2: 31 mEq/L (ref 19–32)
Calcium: 9.3 mg/dL (ref 8.4–10.5)
Chloride: 101 mEq/L (ref 96–112)
Creatinine, Ser: 1.08 mg/dL (ref 0.40–1.50)
GFR: 72.93 mL/min (ref >60.00)
Glucose, Bld: 96 mg/dL (ref 70–99)
Potassium: 4.3 mEq/L (ref 3.5–5.1)
Sodium: 140 mEq/L (ref 135–145)
Total Bilirubin: 0.5 mg/dL (ref 0.2–1.2)
Total Protein: 7.1 g/dL (ref 6.0–8.3)

## 2022-01-15 LAB — LIPID PANEL
Cholesterol: 93 mg/dL (ref 0–200)
HDL: 45.2 mg/dL (ref >39.00)
LDL Cholesterol: 33 mg/dL (ref 0–99)
NonHDL: 47.83
Total CHOL/HDL Ratio: 2
Triglycerides: 74 mg/dL (ref 0.0–149.0)
VLDL: 14.8 mg/dL (ref 0.0–40.0)

## 2022-01-15 LAB — HEMOGLOBIN A1C: Hgb A1c MFr Bld: 5.7 % (ref 4.6–6.5)

## 2022-01-15 NOTE — Patient Instructions (Signed)
Please return in August for your annual complete physical; please come fasting.   I will release your lab results to you on your MyChart account with further instructions. You may see the results before I do, but when I review them I will send you a message with my report or have my assistant call you if things need to be discussed. Please reply to my message with any questions. Thank you!   If you have any questions or concerns, please don't hesitate to send me a message via MyChart or call the office at [phone]. Thank you for visiting with us today! It's our pleasure caring for you.  

## 2022-01-15 NOTE — Progress Notes (Signed)
Subjective  CC:  Chief Complaint  Patient presents with   Pre Diabetes    Recheck sugar and lipid. Pt does not report any issues on Crestor.    Hyperlipidemia    HPI: Michael Gerry. is a 66 y.o. male who presents to the office today for follow up of diabetes and problems listed above in the chief complaint.  Prediabetes: He has joined a [employer] for diabetes education program.  Changed his diet.  Weight is down significantly.  No symptoms of hyperglycemia. Hyperlipidemia on Crestor 10 nightly, tolerating well.  No adverse effects.  Taking for 3 months.  Ready for recheck.  Fasting Had gross hematuria 1 to 2 weeks ago.  Has seen his urologist.  CT scan and cystoscopy are scheduled. Mood disorder and schwannoma: Stable Wt Readings from Last 3 Encounters:  01/15/22 232 lb 9.6 oz (105.5 kg)  10/17/21 256 lb (116.1 kg)  06/22/21 252 lb 12.8 oz (114.7 kg)    BP Readings from Last 3 Encounters:  01/15/22 130/70  10/17/21 140/88  06/22/21 114/73    Assessment  1. Prediabetes   2. Mixed hyperlipidemia   3. Bipolar 2 disorder (HCC) Chronic  4. Schwannoma of spinal cord (HCC) Chronic  5. Gross hematuria      Plan  Prediabetes: Has made significant changes in diet and weight loss.  Recheck A1c today.  Possibly resolved. Hyperlipidemia Crestor 10.  Recheck fasting lipids and LFTs. Mood and schwannoma stable.  Reviewed medications. Gross hematuria with work-up ongoing per urology.  We will follow along.  Follow up: August for complete physical. Orders Placed This Encounter  Procedures   Lipid panel   Hemoglobin A1c   Comprehensive metabolic panel   No orders of the defined types were placed in this encounter.     Immunization History  Administered Date(s) Administered   Hepatitis A 10/23/2016, 04/24/2017   Influenza Split 11/22/2015   Influenza,inj,Quad PF,6+ Mos 07/21/2018, 07/25/2020   Influenza-Unspecified 09/24/2016, 10/23/2017, 08/23/2021   Moderna  Covid-19 Vaccine Bivalent Booster 66yr & up 08/23/2021   Moderna Sars-Covid-2 Vaccination 11/19/2019, 12/18/2019, 08/27/2020, 04/22/2021   Pneumococcal Polysaccharide-23 05/16/2018   Tdap 08/05/2017   Zoster Recombinat (Shingrix) 07/25/2020, 02/01/2021    Diabetes Related Lab Review: Lab Results  Component Value Date   HGBA1C 5.8 06/22/2021   HGBA1C 5.3 10/23/2017   HGBA1C 5.0 09/24/2016    No results found for: MDerl BarrowLab Results  Component Value Date   CREATININE 1.10 06/22/2021   BUN 8 06/22/2021   NA 141 06/22/2021   K 4.3 06/22/2021   CL 103 06/22/2021   CO2 30 06/22/2021   Lab Results  Component Value Date   CHOL 188 06/22/2021   CHOL 164 05/05/2018   CHOL 190 10/23/2017   Lab Results  Component Value Date   HDL 51.50 06/22/2021   HDL 55.40 05/05/2018   HDL 66 10/23/2017   Lab Results  Component Value Date   LDLCALC 109 (H) 06/22/2021   LDLCALC 91 05/05/2018   LDLCALC 107 10/23/2017   Lab Results  Component Value Date   TRIG 138.0 06/22/2021   TRIG 88.0 05/05/2018   TRIG 82 10/23/2017   TRIG 82 10/23/2017   Lab Results  Component Value Date   CHOLHDL 4 06/22/2021   CHOLHDL 3 05/05/2018   No results found for: LDLDIRECT The 10-year ASCVD risk score (Arnett DK, et al., 2019) is: 10.5%   Values used to calculate the score:     Age:  67 years     Sex: Male     Is Non-Hispanic African American: No     Diabetic: No     Tobacco smoker: No     Systolic Blood Pressure: 212 mmHg     Is BP treated: No     HDL Cholesterol: 51.5 mg/dL     Total Cholesterol: 188 mg/dL I have reviewed the PMH, Fam and Soc history. Patient Active Problem List   Diagnosis Date Noted   Bipolar 2 disorder ([place]) 07/25/2020    Priority: High   Schwannoma of spinal cord ([place]) 04/22/2018    Priority: High    S/p excision; with residual left weakness and neuropathy    Migraine headache 11/21/2017    Priority: High   Chronic myofascial pain 11/21/2017     Priority: High   GAD (generalized anxiety disorder) 03/29/2016    Priority: High   Cervical spondylosis without myelopathy 04/29/2014    Priority: High   Cervical stenosis of spinal canal 11/12/2008    Priority: High   Colon polyps 07/25/2020    Priority: Medium     Colonoscopy 04/2020, [facility], benign, recall in 5 years    Benign non-nodular prostatic hyperplasia with lower urinary tract symptoms 11/21/2017    Priority: Medium    Gastritis 11/21/2017    Priority: Medium     By EGD 04/2020; h/o h.pylori and GERD. On chronic PPIs. Will need GI in [place]    Incomplete right bundle branch block 10/18/2016    Priority: Medium     Pt reported. Nl EKG 10/2020 here. Reportedly normal and ECHO in 2018 with Cards in [place].     Insomnia, persistent 03/29/2016    Priority: Medium    Stricture of anterior urethra in male 09/11/2018    Priority: Low   Erectile dysfunction due to arterial insufficiency 11/21/2017    Priority: Low    Followed by [facility]  Stable, Chronic  App scheduled to discuss possible penile prosthesis with [facility]  per 12/16/80 note     Lichen planus 50/01/7047    Priority: Low    Lichen planus of the penis, improved - Cont Protopic 0.1% BID to the lesions  - Can consider applying after trauma (i.e. Sexual intercourse) to the area to prevent koebnerization     Nontoxic thyroid nodule 11/21/2017    Priority: Low    Nontoxic, FNA 04/2020: benign nodule.    Glaucoma 11/21/2017    Priority: Low   Peyronie's disease 11/12/2016    Priority: Low    Followed by [facility]  Stable, chronic     Nondependent alcohol abuse, in remission 03/29/2016    Priority: Low   Status post cervical spinal fusion 04/29/2014    Priority: Low   Mixed hyperlipidemia 01/15/2022   Prediabetes 01/15/2022    Social History: Patient  reports that he quit smoking about 29 years ago. His smoking use included cigarettes. He has never used smokeless tobacco. He reports that he does  not drink alcohol and does not use drugs.  Review of Systems: Ophthalmic: negative for eye pain, loss of vision or double vision Cardiovascular: negative for chest pain Respiratory: negative for SOB or persistent cough Gastrointestinal: negative for abdominal pain Genitourinary: negative for dysuria or gross hematuria MSK: negative for foot lesions Neurologic: negative for weakness or gait disturbance  Objective  Vitals: BP 130/70    Pulse 81    Temp 97.8 F (36.6 C) (Temporal)    Ht '6\' 4"'$  (1.93 m)  Wt 232 lb 9.6 oz (105.5 kg)    SpO2 98%    BMI 28.31 kg/m  General: well appearing, no acute distress  Psych:  Alert and oriented, normal mood and affect  Diabetic education: ongoing education regarding chronic disease management for diabetes was given today. We continue to reinforce the ABC's of diabetic management: A1c (<7 or 8 dependent upon patient), tight blood pressure control, and cholesterol management with goal LDL < 100 minimally. We discuss diet strategies, exercise recommendations, medication options and possible side effects. At each visit, we review recommended immunizations and preventive care recommendations for diabetics and stress that good diabetic control can prevent other problems. See below for this patient's data.   Commons side effects, risks, benefits, and alternatives for medications and treatment plan prescribed today were discussed, and the patient expressed understanding of the given instructions. Patient is instructed to call or message via MyChart if he/she has any questions or concerns regarding our treatment plan. No barriers to understanding were identified. We discussed Red Flag symptoms and signs in detail. Patient expressed understanding regarding what to do in case of urgent or emergency type symptoms.  Medication list was reconciled, printed and provided to the patient in AVS. Patient instructions and summary information was reviewed with the patient as  documented in the AVS. This note was prepared with assistance of Dragon voice recognition software. Occasional wrong-word or sound-a-like substitutions may have occurred due to the inherent limitations of voice recognition software  This visit occurred during the SARS-CoV-2 public health emergency.  Safety protocols were in place, including screening questions prior to the visit, additional usage of staff PPE, and extensive cleaning of exam room while observing appropriate contact time as indicated for disinfecting solutions.

## 2022-02-07 NOTE — Telephone Encounter (Signed)
Received approval from [facility]. [employer] approved through 01/19/23. Approval faxed to pharmacy. Received a receipt of confirmation. ? ?

## 2022-02-07 NOTE — Telephone Encounter (Signed)
Completed [employer] PA on Cover My Meds. Key: [id number]. Awaiting determination from [facility].  ?

## 2022-03-23 DIAGNOSIS — N35814 Other anterior urethral stricture, male: Secondary | ICD-10-CM | POA: Diagnosis not present

## 2022-03-23 DIAGNOSIS — N486 Induration penis plastica: Secondary | ICD-10-CM | POA: Diagnosis not present

## 2022-03-23 DIAGNOSIS — R310 Gross hematuria: Secondary | ICD-10-CM | POA: Diagnosis not present

## 2022-03-23 DIAGNOSIS — N3289 Other specified disorders of bladder: Secondary | ICD-10-CM | POA: Diagnosis not present

## 2022-03-23 DIAGNOSIS — N320 Bladder-neck obstruction: Secondary | ICD-10-CM | POA: Diagnosis not present

## 2022-03-23 DIAGNOSIS — N401 Enlarged prostate with lower urinary tract symptoms: Secondary | ICD-10-CM | POA: Diagnosis not present

## 2022-03-23 DIAGNOSIS — N528 Other male erectile dysfunction: Secondary | ICD-10-CM | POA: Diagnosis not present

## 2022-03-26 ENCOUNTER — Ambulatory Visit (INDEPENDENT_AMBULATORY_CARE_PROVIDER_SITE_OTHER): Payer: BC Managed Care – PPO | Admitting: Family Medicine

## 2022-03-26 ENCOUNTER — Encounter: Payer: Self-pay | Admitting: Family Medicine

## 2022-03-26 VITALS — BP 120/74 | HR 76 | Temp 98.4°F | Ht 76.0 in | Wt 217.0 lb

## 2022-03-26 DIAGNOSIS — N35914 Unspecified anterior urethral stricture, male: Secondary | ICD-10-CM

## 2022-03-26 DIAGNOSIS — R310 Gross hematuria: Secondary | ICD-10-CM | POA: Diagnosis not present

## 2022-03-26 DIAGNOSIS — G8929 Other chronic pain: Secondary | ICD-10-CM | POA: Diagnosis not present

## 2022-03-26 DIAGNOSIS — M545 Low back pain, unspecified: Secondary | ICD-10-CM | POA: Diagnosis not present

## 2022-03-26 NOTE — Progress Notes (Signed)
? ? ?Subjective  ?CC:  ?Chief Complaint  ?Patient presents with  ? CT results  ?  Pt would like to discuss results of CT and also some back pain. He state that he has been have some back issues for the past 25mo. He has had trouble standing up and the last episode kept him in bed for 1 week.   ? ? ?HPI: Michael Garcia is a 66y.o. male who presents to the office today to address the problems listed above in the chief complaint. ?66year old male who is seen urology for gross hematuria evaluation.  Had recent abdominal pelvic CT scan.  Lower chest showed some right-sided cystic subpleural changes.  He want to know if these were clinically significant.  He denies cough or shortness of breath.  A strong family history of COPD and remote history of smoking.  He worries that he could have this. ?Gross hematuria evaluation to date has been negative except for BPH and bladder wall thickening from chronic outlet obstruction.  Per patient, no urologic indications for procedures.  They are monitoring. ?Chronic back pain with right-sided back pain flare.  MRI showed chronic DJD changes.  He has no radicular symptoms. ? ?Assessment  ?1. Gross hematuria   ?2. Stricture of anterior urethra in male, unspecified stricture type   ?3. Chronic midline low back pain without sciatica   ? ?  ?Plan  ?Gross hematuria and urethral stricture: We will follow along with urology. ?Chronic back pain with flare: Patient to follow-up with Dr. NGayla Medicusfor further evaluation and treatment plans. ? ?Follow up: As scheduled ?06/27/2022 ? ?No orders of the defined types were placed in this encounter. ? ?No orders of the defined types were placed in this encounter. ? ?  ? ?I reviewed the patients updated PMH, FH, and SocHx.  ?  ?Patient Active Problem List  ? Diagnosis Date Noted  ? Bipolar 2 disorder ([place] 07/25/2020  ?  Priority: High  ? Schwannoma of spinal cord ([place] 04/22/2018  ?  Priority: High  ? Migraine headache 11/21/2017  ?  Priority: High   ? Chronic myofascial pain 11/21/2017  ?  Priority: High  ? GAD (generalized anxiety disorder) 03/29/2016  ?  Priority: High  ? Cervical spondylosis without myelopathy 04/29/2014  ?  Priority: High  ? Cervical stenosis of spinal canal 11/12/2008  ?  Priority: High  ? Colon polyps 07/25/2020  ?  Priority: Medium   ? Benign non-nodular prostatic hyperplasia with lower urinary tract symptoms 11/21/2017  ?  Priority: Medium   ? Gastritis 11/21/2017  ?  Priority: Medium   ? Incomplete right bundle branch block 10/18/2016  ?  Priority: Medium   ? Insomnia, persistent 03/29/2016  ?  Priority: Medium   ? Stricture of anterior urethra in male 09/11/2018  ?  Priority: Low  ? Erectile dysfunction due to arterial insufficiency 11/21/2017  ?  Priority: Low  ? Lichen planus 091/63/8466 ?  Priority: Low  ? Nontoxic thyroid nodule 11/21/2017  ?  Priority: Low  ? Glaucoma 11/21/2017  ?  Priority: Low  ? Peyronie's disease 11/12/2016  ?  Priority: Low  ? Nondependent alcohol abuse, in remission 03/29/2016  ?  Priority: Low  ? Status post cervical spinal fusion 04/29/2014  ?  Priority: Low  ? Mixed hyperlipidemia 01/15/2022  ? Prediabetes 01/15/2022  ? ?Current Meds  ?Medication Sig  ? alfuzosin (UROXATRAL) 10 MG 24 hr tablet TAKE 1 TABLET(10 MG) BY MOUTH DAILY WITH  BREAKFAST  ? ALPRAZolam (XANAX) 1 MG tablet Take 1 mg by mouth as needed. Take 1/2 tablet twice daily and 2 tablets at bedtime.  ? buPROPion (WELLBUTRIN XL) 300 MG 24 hr tablet Take 300 mg by mouth at bedtime.  ? cetirizine (ZYRTEC) 10 MG tablet Take 1 tablet by mouth daily.  ? esomeprazole (NEXIUM) 40 MG capsule TAKE 1 CAPSULE(40 MG) BY MOUTH DAILY  ? FIBER PO Take by mouth.  ? fluticasone (FLONASE) 50 MCG/ACT nasal spray INSTILL 2 SPRAYS IN EACH NOSTRIL EVERY DAY  ? Galcanezumab-gnlm (EMGALITY) 120 MG/ML SOAJ Inject 120 mg into the skin every 30 (thirty) days.  ? lamoTRIgine (LAMICTAL) 150 MG tablet Take 200 mg by mouth 2 (two) times daily.  ? MULTIPLE VITAMIN PO Take by  mouth.  ? rizatriptan (MAXALT) 10 MG tablet TAKE 1 TABLET BY MOUTH AT ONSET OF MIGRAINE. REPEAT IN 2 HOURS AS NEEDED. ONLY 2 TABLET/ 24 HOURS  ? rosuvastatin (CRESTOR) 10 MG tablet Take 1 tablet (10 mg total) by mouth daily.  ? tacrolimus (PROTOPIC) 0.1 % ointment in the morning and at bedtime.  ? traZODone (DESYREL) 100 MG tablet Take 50-100 mg by mouth at bedtime as needed.  ? triamcinolone cream (KENALOG) 0.1 %   ? ? ?Allergies: ?Patient is allergic to bacitracin, iodine, and sulfamethoxazole-trimethoprim. ?Family History: ?Patient family history includes Diabetes in his sister; Healthy in his son; Heart disease in his mother; Irritable bowel syndrome in his mother; Migraines in his daughter; Neuropathy in his daughter. ?Social History:  ?Patient  reports that he quit smoking about 29 years ago. His smoking use included cigarettes. He has never used smokeless tobacco. He reports that he does not drink alcohol and does not use drugs. ? ?Review of Systems: ?Constitutional: Negative for fever malaise or anorexia ?Cardiovascular: negative for chest pain ?Respiratory: negative for SOB or persistent cough ?Gastrointestinal: negative for abdominal pain ? ?Objective  ?Vitals: BP 120/74   Pulse 76   Temp 98.4 ?F (36.9 ?C)   Ht '6\' 4"'$  (1.93 m)   Wt 217 lb (98.4 kg)   SpO2 99%   BMI 26.41 kg/m?  ?General: no acute distress , A&Ox3 ?Antalgic gait ? ? ?Commons side effects, risks, benefits, and alternatives for medications and treatment plan prescribed today were discussed, and the patient expressed understanding of the given instructions. Patient is instructed to call or message via MyChart if he/she has any questions or concerns regarding our treatment plan. No barriers to understanding were identified. We discussed Red Flag symptoms and signs in detail. Patient expressed understanding regarding what to do in case of urgent or emergency type symptoms.  ?Medication list was reconciled, printed and provided to the  patient in AVS. Patient instructions and summary information was reviewed with the patient as documented in the AVS. ?This note was prepared with assistance of Systems analyst. Occasional wrong-word or sound-a-like substitutions may have occurred due to the inherent limitations of voice recognition software ? ?This visit occurred during the SARS-CoV-2 public health emergency.  Safety protocols were in place, including screening questions prior to the visit, additional usage of staff PPE, and extensive cleaning of exam room while observing appropriate contact time as indicated for disinfecting solutions.  ? ?

## 2022-03-27 ENCOUNTER — Telehealth: Payer: Self-pay | Admitting: Physical Medicine and Rehabilitation

## 2022-03-27 NOTE — Telephone Encounter (Signed)
Called pt to get sch for [facility]. Please send to me if call is returned.  ?

## 2022-04-03 ENCOUNTER — Ambulatory Visit: Payer: Self-pay | Admitting: Family Medicine

## 2022-04-03 DIAGNOSIS — F3132 Bipolar disorder, current episode depressed, moderate: Secondary | ICD-10-CM | POA: Diagnosis not present

## 2022-04-03 DIAGNOSIS — G4700 Insomnia, unspecified: Secondary | ICD-10-CM | POA: Diagnosis not present

## 2022-04-03 DIAGNOSIS — F411 Generalized anxiety disorder: Secondary | ICD-10-CM | POA: Diagnosis not present

## 2022-04-04 ENCOUNTER — Encounter: Payer: Self-pay | Admitting: Physical Medicine and Rehabilitation

## 2022-04-04 ENCOUNTER — Ambulatory Visit (INDEPENDENT_AMBULATORY_CARE_PROVIDER_SITE_OTHER): Payer: BC Managed Care – PPO | Admitting: Physical Medicine and Rehabilitation

## 2022-04-04 DIAGNOSIS — M7918 Myalgia, other site: Secondary | ICD-10-CM | POA: Diagnosis not present

## 2022-04-04 DIAGNOSIS — M542 Cervicalgia: Secondary | ICD-10-CM | POA: Diagnosis not present

## 2022-04-04 DIAGNOSIS — M47816 Spondylosis without myelopathy or radiculopathy, lumbar region: Secondary | ICD-10-CM | POA: Diagnosis not present

## 2022-04-04 DIAGNOSIS — G8929 Other chronic pain: Secondary | ICD-10-CM

## 2022-04-04 DIAGNOSIS — M961 Postlaminectomy syndrome, not elsewhere classified: Secondary | ICD-10-CM

## 2022-04-04 DIAGNOSIS — M545 Low back pain, unspecified: Secondary | ICD-10-CM | POA: Diagnosis not present

## 2022-04-04 NOTE — Progress Notes (Unsigned)
Michael Garcia. - 66 y.o. male MRN [id number]  Date of birth: January 30, 1956  Office Visit Note: Visit Date: 04/04/2022 PCP: Leamon Arnt, MD Referred by: Leamon Arnt, MD  Subjective: Chief Complaint  Patient presents with   Lower Back - Pain   HPI: Michael Garcia. is a 66 y.o. male who comes in today for evaluation of chronic, worsening and severe bilateral lower back pain with intermittent radiating to left buttock and thigh region.  Patient reports pain has been ongoing for several years, states bilateral lower back pain is the most severe issue at this time.  Patient states pain is exacerbated by bending, moving from a sitting to standing position and activity.  He reports some relief of pain with home exercise regimen, rest and use of over-the-counter medications. Patients lumbar MRI from 2020 exhibits disc bulge and facet arthropathy at L4-L5 contributing to mild right and moderate left neural foraminal narrowing, unchanged postoperative appearance from prior T11-L1 laminoplasty and resection of intradural tumor. No evidence of local recurrence. No high grade spinal canal stenosis noted. Patient was initially seen in our office in 2019 when we discovered he had intradural mass at the level of T2-L1. Patient has resection performed in July of 2019 by Dr. Cheryll Cockayne with Neurosurgery at [facility], states some neurogenic issues with left leg since surgery, however he has recovered without significant issues. Patient states his pain remains severe and is negatively impacting his daily life. Patient would like to proceed with injection if warranted. Patient denies focal weakness, numbness and tingling. Patient denies recent trauma or falls.   Incidentally, patient also mentioned chronic, worsening and severe right-sided neck pain radiating down to right thoracic region and up to head. Patient reports pain has been ongoing for several years as well. Patient states pain worsens with  activity and movement, describes as a tender and cramping sensation. Patient reports previous treatment with formal physical therapy/dry needling treatments. Patient reports dry needling treatments were very painful. History of previous C6-C7 ACDF, cervical MRI imaging from 2018 exhibits redemonstration of ACDF at C6-C7, mild to moderate canal stenosis and bilateral neuroforaminal narrowing at C5-C6. We have not repeated cervical MRI imaging recently.   Review of Systems  Musculoskeletal:  Positive for back pain, myalgias and neck pain.  Neurological:  Negative for tingling, sensory change, focal weakness and weakness.  All other systems reviewed and are negative. Otherwise per HPI.  Assessment & Plan: Visit Diagnoses:    ICD-10-CM   1. Chronic bilateral low back pain without sciatica  M54.50 Ambulatory referral to Physical Medicine Rehab   G89.29     2. Spondylosis without myelopathy or radiculopathy, lumbar region  M47.816 Ambulatory referral to Physical Medicine Rehab    3. Facet hypertrophy of lumbar region  M47.816 Ambulatory referral to Physical Medicine Rehab    4. Cervicalgia  M54.2     5. Myofascial pain syndrome  M79.18     6. Post laminectomy syndrome  M96.1        Plan: Findings:  1. Chronic, worsening and severe bilateral lower back pain with intermittent radiation to left buttock/thigh region. Bilateral lower back causing most severe discomfort at this time. Patient continues to have severe pain despite good conservative therapies such as home exercise regimen, rest and use of over the counter medications.  Patients clinical presentation and exam are consistent with facet mediated pain, severe pain noted with lumbar extension upon exam. We believe the next step is to perform diagnostic and  hopefully therapeutic bilateral L4-L5 facet joint/medial branch blocks under fluoroscopic guidance. If patient gets good relief with facet joint blocks we did discuss the possibility of  longer sustained pain relief with radiofrequency ablation procedure. Patient encouraged to remain active. We will call to schedule when we get approval from insurance. No red flag symptoms noted upon exam today.   2. Chronic, worsening and severe right sided neck pain radiating down to right thoracic region and up to head. Patients clinical presentation and exam are consistent with myofascial pain. We feel the next step would be to perform cervical trigger point injections, we agreed to do this after lumbar injections. If patients pain persists we would be quick to update cervical MRI imaging. We also feel patient could benefit from re-grouping with physical therapy for possible dry needling treatments.    Meds & Orders: No orders of the defined types were placed in this encounter.   Orders Placed This Encounter  Procedures   Ambulatory referral to Physical Medicine Rehab    Follow-up: Return for Bilateral L4-L5 facet joint/medial branch blocks.   Procedures: No procedures performed      Clinical History: Procedure: MRI LUMBAR SPINE WITHOUT AND WITH CONTRAST   INDICATION: Back pain or radiculopathy, prior surgery, new symptoms, s/p  T11-L1 laminoplasty for resection of intradural lesion, G95.89 Other  specified diseases of spinal cord (CMS-HCC)     COMPARISON: MRI lumbar spine 06/30/2018.   TECHNIQUE/PROTOCOL: Intradural protocol lumbar spine pre and post contrast  MRI performed.   FINDINGS:  Unchanged postoperative appearance from prior T11-L1 laminoplasty.  Conus: The conus terminates at approximately T12-L1.  Spinal Cord and Cauda Equina: Stable postoperative morphology of the conus  and proximal cauda equina nerve roots from resection of prior intradural  mass.  Alignment: Normal.  Marrow Signal: Degenerative endplate marrow signal changes at L4-L5.  Epidural Hematoma: None.  Segmentation: Normal.  Vertebral Body Heights: Normal.  Sacroiliac Joints: Moderate degenerative  changes.   Paraspinal Soft Tissues: Mild fatty atrophy of the paraspinal musculature.  Unchanged postoperative scarring from T11-L1.  Retroperitoneal Soft Tissues: Retroaortic left renal vein.   T12-L1: Unremarkable.    L1-L2: Unremarkable.  L2-L3: Unremarkable.    L3-L4: Disc bulge without spinal canal stenosis or neural foraminal  narrowing.    L4-L5: Disc bulge and facet arthropathy contribute to mild right and  moderate left neural foraminal narrowing. No spinal canal stenosis.    L5-S1: Unremarkable.     IMPRESSION:  Unchanged postoperative appearance from prior T11-L1 laminoplasty and  resection of intradural tumor. No evidence of local recurrence.   He reports that he quit smoking about 29 years ago. His smoking use included cigarettes. He has never used smokeless tobacco.  Recent Labs    06/22/21 1021 01/15/22 0844  HGBA1C 5.8 5.7    Objective:  VS:  HT:    WT:   BMI:     BP:   HR: bpm  TEMP: ( )  RESP:  Physical Exam Vitals and nursing note reviewed.  HENT:     Head: Normocephalic and atraumatic.     Right Ear: External ear normal.     Left Ear: External ear normal.     Nose: Nose normal.     Mouth/Throat:     Mouth: Mucous membranes are moist.  Eyes:     Extraocular Movements: Extraocular movements intact.  Cardiovascular:     Rate and Rhythm: Normal rate.     Pulses: Normal pulses.  Pulmonary:  Effort: Pulmonary effort is normal.  Abdominal:     General: Abdomen is flat. There is no distension.  Musculoskeletal:        General: Tenderness present.     Cervical back: Tenderness present.     Comments: Pt rises from seated position to standing without difficulty. Concordant low back pain with facet loading, lumbar spine extension and rotation. Strong distal strength without clonus, no pain upon palpation of greater trochanters. Sensation intact bilaterally. Walks independently, gait steady.   No discomfort noted with flexion, extension and  side-to-side rotation. Patient has good strength in the upper extremities including 5 out of 5 strength in wrist extension, long finger flexion and APB.  There is no atrophy of the hands intrinsically. Multiple palpable trigger points noted to right trapezius and rhomboid regions upon palpation.  Sensation intact bilaterally. Negative Hoffman's sign.  Skin:    General: Skin is warm and dry.     Capillary Refill: Capillary refill takes less than 2 seconds.  Neurological:     General: No focal deficit present.     Mental Status: He is alert and oriented to person, place, and time.  Psychiatric:        Mood and Affect: Mood normal.        Behavior: Behavior normal.    Ortho Exam  Imaging: No results found.  Past Medical/Family/Surgical/Social History: Medications & Allergies reviewed per EMR, new medications updated. Patient Active Problem List   Diagnosis Date Noted   Mixed hyperlipidemia 01/15/2022   Prediabetes 01/15/2022   Bipolar 2 disorder ([place]) 07/25/2020   Colon polyps 07/25/2020   Stricture of anterior urethra in male 09/11/2018   Schwannoma of spinal cord ([place]) 04/22/2018   Benign non-nodular prostatic hyperplasia with lower urinary tract symptoms 11/21/2017   Erectile dysfunction due to arterial insufficiency 11/21/2017   Gastritis 35/32/9924   Lichen planus 26/83/4196   Migraine headache 11/21/2017   Nontoxic thyroid nodule 11/21/2017   Glaucoma 11/21/2017   Chronic myofascial pain 11/21/2017   Peyronie's disease 11/12/2016   Incomplete right bundle branch block 10/18/2016   GAD (generalized anxiety disorder) 03/29/2016   Insomnia, persistent 03/29/2016   Nondependent alcohol abuse, in remission 03/29/2016   Cervical spondylosis without myelopathy 04/29/2014   Status post cervical spinal fusion 04/29/2014   Cervical stenosis of spinal canal 11/12/2008   Past Medical History:  Diagnosis Date   Anxiety    Benign non-nodular prostatic hyperplasia with lower urinary  tract symptoms 11/21/2017   Bipolar 1 disorder, depressed ([place]) 07/25/2020   Cervical spondylosis without myelopathy 04/29/2014   Cervical stenosis of spinal canal 11/12/2008   Chronic headaches    Chronic myofascial pain 11/21/2017   Colon polyps 07/25/2020   Colonoscopy 04/2020, [place], benign, recall in 5 years   Degenerative disc disease, cervical 04/29/2014   Erectile disorder, acquired, generalized, severe 11/21/2017   GAD (generalized anxiety disorder) 03/29/2016   Gastro-esophageal reflux disease with esophagitis 11/21/2017   Glaucoma 11/21/2017   Incomplete right bundle branch block 10/18/2016   Insomnia, persistent 01/03/9797   Lichen planus, penis 11/21/2017   Nondependent alcohol abuse, in remission 03/29/2016   Nonintractable migraine, unspecified migraine type 11/21/2017   Peyronie disease 11/12/2016   Status post cervical spinal fusion 04/29/2014   Thyroid nodule 11/21/2017   benign by FNA 04/2020   Family History  Problem Relation Age of Onset   Heart disease Mother    Irritable bowel syndrome Mother    Diabetes Sister    Neuropathy Daughter  Migraines Daughter    Healthy Son    Past Surgical History:  Procedure Laterality Date   CERVICAL SPINE SURGERY  2010   c6-c7 fusion   HERNIA REPAIR     Social History   Occupational History   Occupation: Psychologist, counselling in [place]  Tobacco Use   Smoking status: Former    Types: Cigarettes    Quit date: 1994    Years since quitting: 29.4   Smokeless tobacco: Never  Vaping Use   Vaping Use: Never used  Substance and Sexual Activity   Alcohol use: No   Drug use: No   Sexual activity: Yes

## 2022-04-04 NOTE — Progress Notes (Unsigned)
Pt sate lower back pain that travels on his right side. Pt state bending and standing up makes the pain worse. Pt state he takes over the counter pain meds to help ease his pain.  Numeric Pain Rating Scale and Functional Assessment Average Pain 8 Pain Right Now 3 My pain is intermittent, constant, dull, stabbing, and aching Pain is worse with: bending, standing, and some activites Pain improves with: medication   In the last MONTH (on 0-10 scale) has pain interfered with the following?  1. General activity like being  able to carry out your everyday physical activities such as walking, climbing stairs, carrying groceries, or moving a chair?  Rating(5)  2. Relation with others like being able to carry out your usual social activities and roles such as  activities at home, at work and in your community. Rating(6)  3. Enjoyment of life such that you have  been bothered by emotional problems such as feeling anxious, depressed or irritable?  Rating(7)

## 2022-04-09 ENCOUNTER — Encounter: Payer: Self-pay | Admitting: Family Medicine

## 2022-05-18 ENCOUNTER — Encounter: Payer: Self-pay | Admitting: Family Medicine

## 2022-05-21 ENCOUNTER — Ambulatory Visit: Payer: Self-pay

## 2022-05-21 ENCOUNTER — Ambulatory Visit (INDEPENDENT_AMBULATORY_CARE_PROVIDER_SITE_OTHER): Payer: BC Managed Care – PPO | Admitting: Physical Medicine and Rehabilitation

## 2022-05-21 ENCOUNTER — Encounter: Payer: Self-pay | Admitting: Physical Medicine and Rehabilitation

## 2022-05-21 VITALS — BP 109/69 | HR 74

## 2022-05-21 DIAGNOSIS — M47816 Spondylosis without myelopathy or radiculopathy, lumbar region: Secondary | ICD-10-CM

## 2022-05-21 MED ORDER — BUPIVACAINE HCL 0.5 % IJ SOLN
3.0000 mL | Freq: Once | INTRAMUSCULAR | Status: AC
  Administered 2022-05-21: 3 mL

## 2022-05-21 NOTE — Progress Notes (Signed)
Pt sate lower back pain that travels on his right side. Pt state bending and standing up makes the pain worse. Pt state he takes over the counter pain meds to help ease his pain.  Numeric Pain Rating Scale and Functional Assessment Average Pain 2   In the last MONTH (on 0-10 scale) has pain interfered with the following?  1. General activity like being  able to carry out your everyday physical activities such as walking, climbing stairs, carrying groceries, or moving a chair?  Rating(9)   +Driver, -BT, -Dye Allergies.

## 2022-05-21 NOTE — Progress Notes (Signed)
Michael Garcia. - 66 y.o. male MRN [id number]  Date of birth: [birth year]  Office Visit Note: Visit Date: 05/21/2022 PCP: Leamon Arnt, MD Referred by: Leamon Arnt, MD  Subjective: Chief Complaint  Patient presents with   Lower Back - Pain   HPI:  [name]. is a 66 y.o. male who comes in today at the request of Barnet Pall, FNP for planned Bilateral  L4-5 Lumbar facet/medial branch block with fluoroscopic guidance.  The patient has failed conservative care including home exercise, medications, time and activity modification.  This injection will be diagnostic and hopefully therapeutic.  Please see requesting physician notes for further details and justification.  Exam has shown concordant pain with facet joint loading.   ROS Otherwise per HPI.  Assessment & Plan: Visit Diagnoses:    ICD-10-CM   1. Spondylosis without myelopathy or radiculopathy, lumbar region  M47.816 XR C-ARM NO REPORT    Facet Injection    bupivacaine (MARCAINE) 0.5 % (with pres) injection 3 mL      Plan: No additional findings.   Meds & Orders:  Meds ordered this encounter  Medications   bupivacaine (MARCAINE) 0.5 % (with pres) injection 3 mL    Orders Placed This Encounter  Procedures   Facet Injection   XR C-ARM NO REPORT    Follow-up: Return in about 2 weeks (around 06/04/2022).   Procedures: No procedures performed  Lumbar Facet Joint Intra-Articular Injection(s) with Fluoroscopic Guidance  Patient: Michael Garcia.      Date of Birth: [birth year] MRN: [id number] PCP: Leamon Arnt, MD      Visit Date: 05/21/2022   Universal Protocol:    Date/Time: 05/21/2022  Consent Given By: the patient  Position: PRONE   Additional Comments: Vital signs were monitored before and after the procedure. Patient was prepped and draped in the usual sterile fashion. The correct patient, procedure, and site was verified.   Injection Procedure Details:  Procedure Site  One Meds Administered:  Meds ordered this encounter  Medications   bupivacaine (MARCAINE) 0.5 % (with pres) injection 3 mL     Laterality: Bilateral  Location/Site:  L4-L5  Needle size: 22 guage  Needle type: Spinal  Needle Placement: Articular  Findings:  -Comments: Excellent flow of contrast producing a partial arthrogram.  Procedure Details: The fluoroscope beam is vertically oriented in AP, and the inferior recess is visualized beneath the lower pole of the inferior apophyseal process, which represents the target point for needle insertion. When direct visualization is difficult the target point is located at the medial projection of the vertebral pedicle. The region overlying each aforementioned target is locally anesthetized with a 1 to 2 ml. volume of 1% Lidocaine without Epinephrine.   The spinal needle was inserted into each of the above mentioned facet joints using biplanar fluoroscopic guidance. A 0.25 to 0.5 ml. volume of Isovue-250 was injected and a partial facet joint arthrogram was obtained. A single spot film was obtained of the resulting arthrogram.    One to 1.25 ml of the steroid/anesthetic solution was then injected into each of the facet joints noted above.   Additional Comments:  The patient tolerated the procedure well Dressing: 2 x 2 sterile gauze and Band-Aid    Post-procedure details: Patient was observed during the procedure. Post-procedure instructions were reviewed.  Patient left the clinic in stable condition.    Clinical History: Procedure: MRI LUMBAR SPINE WITHOUT AND WITH CONTRAST   INDICATION: Back pain or radiculopathy,  prior surgery, new symptoms, s/p  T11-L1 laminoplasty for resection of intradural lesion, G95.89 Other  specified diseases of spinal cord (CMS-HCC)     COMPARISON: MRI lumbar spine 06/30/2018.   TECHNIQUE/PROTOCOL: Intradural protocol lumbar spine pre and post contrast  MRI performed.   FINDINGS:  Unchanged  postoperative appearance from prior T11-L1 laminoplasty.  Conus: The conus terminates at approximately T12-L1.  Spinal Cord and Cauda Equina: Stable postoperative morphology of the conus  and proximal cauda equina nerve roots from resection of prior intradural  mass.  Alignment: Normal.  Marrow Signal: Degenerative endplate marrow signal changes at L4-L5.  Epidural Hematoma: None.  Segmentation: Normal.  Vertebral Body Heights: Normal.  Sacroiliac Joints: Moderate degenerative changes.   Paraspinal Soft Tissues: Mild fatty atrophy of the paraspinal musculature.  Unchanged postoperative scarring from T11-L1.  Retroperitoneal Soft Tissues: Retroaortic left renal vein.   T12-L1: Unremarkable.    L1-L2: Unremarkable.  L2-L3: Unremarkable.    L3-L4: Disc bulge without spinal canal stenosis or neural foraminal  narrowing.    L4-L5: Disc bulge and facet arthropathy contribute to mild right and  moderate left neural foraminal narrowing. No spinal canal stenosis.    L5-S1: Unremarkable.     IMPRESSION:  Unchanged postoperative appearance from prior T11-L1 laminoplasty and  resection of intradural tumor. No evidence of local recurrence.     Objective:  VS:  HT:    WT:   BMI:     BP:109/69  HR:74bpm  TEMP: ( )  RESP:  Physical Exam Vitals and nursing note reviewed.  Constitutional:      General: He is not in acute distress.    Appearance: Normal appearance. He is not ill-appearing.  HENT:     Head: Normocephalic and atraumatic.     Right Ear: External ear normal.     Left Ear: External ear normal.     Nose: No congestion.  Eyes:     Extraocular Movements: Extraocular movements intact.  Cardiovascular:     Rate and Rhythm: Normal rate.     Pulses: Normal pulses.  Pulmonary:     Effort: Pulmonary effort is normal. No respiratory distress.  Abdominal:     General: There is no distension.     Palpations: Abdomen is soft.  Musculoskeletal:        General: No tenderness or  signs of injury.     Cervical back: Neck supple.     Right lower leg: No edema.     Left lower leg: No edema.     Comments: Patient has good distal strength without clonus.  Skin:    Findings: No erythema or rash.  Neurological:     General: No focal deficit present.     Mental Status: He is alert and oriented to person, place, and time.     Sensory: No sensory deficit.     Motor: No weakness or abnormal muscle tone.     Coordination: Coordination normal.  Psychiatric:        Mood and Affect: Mood normal.        Behavior: Behavior normal.      Imaging: XR C-ARM NO REPORT  Result Date: 05/21/2022 Please see Notes tab for imaging impression.

## 2022-05-21 NOTE — Procedures (Signed)
Lumbar Facet Joint Intra-Articular Injection(s) with Fluoroscopic Guidance  Patient: Michael Garcia.      Date of Birth: May 05, 1956 MRN: [id number] PCP: Leamon Arnt, MD      Visit Date: 05/21/2022   Universal Protocol:    Date/Time: 05/21/2022  Consent Given By: the patient  Position: PRONE   Additional Comments: Vital signs were monitored before and after the procedure. Patient was prepped and draped in the usual sterile fashion. The correct patient, procedure, and site was verified.   Injection Procedure Details:  Procedure Site One Meds Administered:  Meds ordered this encounter  Medications   bupivacaine (MARCAINE) 0.5 % (with pres) injection 3 mL     Laterality: Bilateral  Location/Site:  L4-L5  Needle size: 22 guage  Needle type: Spinal  Needle Placement: Articular  Findings:  -Comments: Excellent flow of contrast producing a partial arthrogram.  Procedure Details: The fluoroscope beam is vertically oriented in AP, and the inferior recess is visualized beneath the lower pole of the inferior apophyseal process, which represents the target point for needle insertion. When direct visualization is difficult the target point is located at the medial projection of the vertebral pedicle. The region overlying each aforementioned target is locally anesthetized with a 1 to 2 ml. volume of 1% Lidocaine without Epinephrine.   The spinal needle was inserted into each of the above mentioned facet joints using biplanar fluoroscopic guidance. A 0.25 to 0.5 ml. volume of Isovue-250 was injected and a partial facet joint arthrogram was obtained. A single spot film was obtained of the resulting arthrogram.    One to 1.25 ml of the steroid/anesthetic solution was then injected into each of the facet joints noted above.   Additional Comments:  The patient tolerated the procedure well Dressing: 2 x 2 sterile gauze and Band-Aid    Post-procedure details: Patient was  observed during the procedure. Post-procedure instructions were reviewed.  Patient left the clinic in stable condition.

## 2022-05-21 NOTE — Patient Instructions (Signed)

## 2022-06-04 ENCOUNTER — Ambulatory Visit (INDEPENDENT_AMBULATORY_CARE_PROVIDER_SITE_OTHER): Payer: BC Managed Care – PPO | Admitting: Physical Medicine and Rehabilitation

## 2022-06-04 ENCOUNTER — Encounter: Payer: Self-pay | Admitting: Physical Medicine and Rehabilitation

## 2022-06-04 VITALS — BP 119/64 | HR 76

## 2022-06-04 DIAGNOSIS — M542 Cervicalgia: Secondary | ICD-10-CM

## 2022-06-04 DIAGNOSIS — M47816 Spondylosis without myelopathy or radiculopathy, lumbar region: Secondary | ICD-10-CM

## 2022-06-04 DIAGNOSIS — M546 Pain in thoracic spine: Secondary | ICD-10-CM

## 2022-06-04 DIAGNOSIS — M961 Postlaminectomy syndrome, not elsewhere classified: Secondary | ICD-10-CM | POA: Diagnosis not present

## 2022-06-04 DIAGNOSIS — M545 Low back pain, unspecified: Secondary | ICD-10-CM

## 2022-06-04 DIAGNOSIS — M7918 Myalgia, other site: Secondary | ICD-10-CM

## 2022-06-04 DIAGNOSIS — G8929 Other chronic pain: Secondary | ICD-10-CM

## 2022-06-04 NOTE — Progress Notes (Signed)
Michael Garcia. - 66 y.o. male MRN [id number]  Date of birth: Aug 08, 1956  Office Visit Note: Visit Date: 06/04/2022 PCP: Leamon Arnt, MD Referred by: Leamon Arnt, MD  Subjective: Chief Complaint  Patient presents with   Lower Back - Pain   HPI: Michael Garcia. is a 66 y.o. male who comes in today for evaluation of chronic bilateral lower back pain with intermittent radiating to left buttock and thigh region. Patient reports pain has been ongoing for several years, bilateral lower back pain seems to cause most severe discomfort. Pain is exacerbated by bending, moving from a sitting to standing position and activity. Some relief of pain with home exercise regimen, rest and use of over-the-counter medications. Patients lumbar MRI from 2020 exhibits disc bulge and facet arthropathy at L4-L5 contributing to mild right and moderate left neural foraminal narrowing, unchanged postoperative appearance from prior T11-L1 laminoplasty and resection of intradural tumor. No evidence of local recurrence. No high grade spinal canal stenosis noted. Patient recently underwent bilateral L4-L5 intra-articular facet joint injections in our office on 05/21/2022 and reports greater than 50% relief of pain with this procedure. Patient denies focal weakness, numbness and tingling. Patient denies recent trauma or falls.   Incidentally, patient also mentioned chronic pain radiating from lower cervical spine down to mid thoracic region. Patient reports pain has been ongoing for several years as well. Patient states pain worsens with activity and movement, describes as a tender and cramping sensation. Patient reports previous treatment with formal physical therapy/dry needling treatments that did provide some relief of pain. History of previous C6-C7 ACDF, cervical MRI imaging from 2018 exhibits redemonstration of ACDF at C6-C7, mild to moderate canal stenosis and bilateral neuroforaminal narrowing at C5-C6. We  have not repeated cervical MRI imaging recently. No history of thoracic back imaging.    Review of Systems  Musculoskeletal:  Positive for back pain and myalgias.  Neurological:  Negative for tingling, sensory change, focal weakness and weakness.  All other systems reviewed and are negative.  Otherwise per HPI.  Assessment & Plan: Visit Diagnoses:    ICD-10-CM   1. Chronic bilateral low back pain without sciatica  M54.50 Ambulatory referral to Physical Therapy   G89.29     2. Spondylosis without myelopathy or radiculopathy, lumbar region  M47.816 Ambulatory referral to Physical Therapy    3. Facet hypertrophy of lumbar region  M47.816 Ambulatory referral to Physical Therapy    4. Post laminectomy syndrome  M96.1 Ambulatory referral to Physical Therapy    5. Cervicalgia  M54.2 Ambulatory referral to Physical Therapy    6. Chronic midline thoracic back pain  M54.6 Ambulatory referral to Physical Therapy   G89.29     7. Myofascial pain syndrome  M79.18 Ambulatory referral to Physical Therapy       Plan: Findings:  1. Chronic bilateral lower back pain with intermittent radiating to left buttock and thigh region. Greater than 50% relief with recent bilateral L4-L5 intra-articular facet joint injections. Patient encouraged to continue with conservative therapies as needed to help with management of pain. We will continue to monitor, can repeat this procedure infrequently as warranted. No red flag symptoms noted upon exam today.   2. Chronic pain radiating from lower cervical spine down to mid thoracic region. Patients clinical presentation and exam are consistent with myofascial pain. Next step will be to have him re-group with our in house physical therapy team with a focus on manual treatments and dry needling. If his  pain persists following PT we did discuss the possibility of obtaining thoracic MRI imaging. No red flag symptoms noted upon exam today.     Meds & Orders: No orders of  the defined types were placed in this encounter.   Orders Placed This Encounter  Procedures   Ambulatory referral to Physical Therapy    Follow-up: Return if symptoms worsen or fail to improve.   Procedures: No procedures performed      Clinical History: Procedure: MRI LUMBAR SPINE WITHOUT AND WITH CONTRAST   INDICATION: Back pain or radiculopathy, prior surgery, new symptoms, s/p  T11-L1 laminoplasty for resection of intradural lesion, G95.89 Other  specified diseases of spinal cord (CMS-HCC)     COMPARISON: MRI lumbar spine 06/30/2018.   TECHNIQUE/PROTOCOL: Intradural protocol lumbar spine pre and post contrast  MRI performed.   FINDINGS:  Unchanged postoperative appearance from prior T11-L1 laminoplasty.  Conus: The conus terminates at approximately T12-L1.  Spinal Cord and Cauda Equina: Stable postoperative morphology of the conus  and proximal cauda equina nerve roots from resection of prior intradural  mass.  Alignment: Normal.  Marrow Signal: Degenerative endplate marrow signal changes at L4-L5.  Epidural Hematoma: None.  Segmentation: Normal.  Vertebral Body Heights: Normal.  Sacroiliac Joints: Moderate degenerative changes.   Paraspinal Soft Tissues: Mild fatty atrophy of the paraspinal musculature.  Unchanged postoperative scarring from T11-L1.  Retroperitoneal Soft Tissues: Retroaortic left renal vein.   T12-L1: Unremarkable.    L1-L2: Unremarkable.  L2-L3: Unremarkable.    L3-L4: Disc bulge without spinal canal stenosis or neural foraminal  narrowing.    L4-L5: Disc bulge and facet arthropathy contribute to mild right and  moderate left neural foraminal narrowing. No spinal canal stenosis.    L5-S1: Unremarkable.     IMPRESSION:  Unchanged postoperative appearance from prior T11-L1 laminoplasty and  resection of intradural tumor. No evidence of local recurrence.   He reports that he quit smoking about 29 years ago. His smoking use included cigarettes.  He has never used smokeless tobacco.  Recent Labs    06/22/21 1021 01/15/22 0844  HGBA1C 5.8 5.7    Objective:  VS:  HT:    WT:   BMI:     BP:119/64  HR:76bpm  TEMP: ( )  RESP:  Physical Exam Vitals and nursing note reviewed.  HENT:     Head: Normocephalic and atraumatic.     Right Ear: External ear normal.     Left Ear: External ear normal.     Nose: Nose normal.     Mouth/Throat:     Mouth: Mucous membranes are moist.  Eyes:     Extraocular Movements: Extraocular movements intact.  Cardiovascular:     Rate and Rhythm: Normal rate.     Pulses: Normal pulses.  Pulmonary:     Effort: Pulmonary effort is normal.  Abdominal:     General: Abdomen is flat. There is no distension.  Musculoskeletal:        General: Tenderness present.     Cervical back: Tenderness present.     Comments: Pt rises from seated position to standing without difficulty. Concordant low back pain with facet loading, lumbar spine extension and rotation. Strong distal strength without clonus, no pain upon palpation of greater trochanters. Sensation intact bilaterally. Walks independently, gait steady.    No discomfort noted with flexion, extension and side-to-side rotation. Patient has good strength in the upper extremities including 5 out of 5 strength in wrist extension, long finger flexion and APB.  There is no atrophy of the hands intrinsically. Tenderness noted upon palpation of lower cervical and mid thoracic paraspinal regions. Sensation intact bilaterally. Negative Hoffman's sign.   Skin:    General: Skin is warm and dry.     Capillary Refill: Capillary refill takes less than 2 seconds.  Neurological:     General: No focal deficit present.     Mental Status: He is alert and oriented to person, place, and time.  Psychiatric:        Mood and Affect: Mood normal.        Behavior: Behavior normal.     Ortho Exam  Imaging: No results found.  Past Medical/Family/Surgical/Social  History: Medications & Allergies reviewed per EMR, new medications updated. Patient Active Problem List   Diagnosis Date Noted   Mixed hyperlipidemia 01/15/2022   Prediabetes 01/15/2022   Bipolar 2 disorder ([place]) 07/25/2020   Colon polyps 07/25/2020   Stricture of anterior urethra in male 09/11/2018   Schwannoma of spinal cord ([place]) 04/22/2018   Benign non-nodular prostatic hyperplasia with lower urinary tract symptoms 11/21/2017   Erectile dysfunction due to arterial insufficiency 11/21/2017   Gastritis 01/60/1093   Lichen planus 23/55/7322   Migraine headache 11/21/2017   Nontoxic thyroid nodule 11/21/2017   Glaucoma 11/21/2017   Chronic myofascial pain 11/21/2017   Peyronie's disease 11/12/2016   Incomplete right bundle branch block 10/18/2016   GAD (generalized anxiety disorder) 03/29/2016   Insomnia, persistent 03/29/2016   Nondependent alcohol abuse, in remission 03/29/2016   Cervical spondylosis without myelopathy 04/29/2014   Status post cervical spinal fusion 04/29/2014   Cervical stenosis of spinal canal 11/12/2008   Past Medical History:  Diagnosis Date   Anxiety    Benign non-nodular prostatic hyperplasia with lower urinary tract symptoms 11/21/2017   Bipolar 1 disorder, depressed ([place]) 07/25/2020   Cervical spondylosis without myelopathy 04/29/2014   Cervical stenosis of spinal canal 11/12/2008   Chronic headaches    Chronic myofascial pain 11/21/2017   Colon polyps 07/25/2020   Colonoscopy 04/2020, [place], benign, recall in 5 years   Degenerative disc disease, cervical 04/29/2014   Erectile disorder, acquired, generalized, severe 11/21/2017   GAD (generalized anxiety disorder) 03/29/2016   Gastro-esophageal reflux disease with esophagitis 11/21/2017   Glaucoma 11/21/2017   Incomplete right bundle branch block 10/18/2016   Insomnia, persistent 0/25/4270   Lichen planus, penis 11/21/2017   Nondependent alcohol abuse, in remission 03/29/2016   Nonintractable migraine,  unspecified migraine type 11/21/2017   Peyronie disease 11/12/2016   Status post cervical spinal fusion 04/29/2014   Thyroid nodule 11/21/2017   benign by FNA 04/2020   Family History  Problem Relation Age of Onset   Heart disease Mother    Irritable bowel syndrome Mother    Diabetes Sister    Neuropathy Daughter    Migraines Daughter    Healthy Son    Past Surgical History:  Procedure Laterality Date   CERVICAL SPINE SURGERY  2010   c6-c7 fusion   HERNIA REPAIR     Social History   Occupational History   Occupation: Psychologist, 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

## 2022-06-04 NOTE — Progress Notes (Unsigned)
Pt has hx of inj on 05/21/22 pt state it helped. Pt state his back is sore today.  Numeric Pain Rating Scale and Functional Assessment Average Pain 7   In the last MONTH (on 0-10 scale) has pain interfered with the following?  1. General activity like being  able to carry out your everyday physical activities such as walking, climbing stairs, carrying groceries, or moving a chair?  Rating(9)

## 2022-06-13 ENCOUNTER — Ambulatory Visit (INDEPENDENT_AMBULATORY_CARE_PROVIDER_SITE_OTHER): Payer: BC Managed Care – PPO | Admitting: Physical Therapy

## 2022-06-13 DIAGNOSIS — M6281 Muscle weakness (generalized): Secondary | ICD-10-CM | POA: Diagnosis not present

## 2022-06-13 DIAGNOSIS — M542 Cervicalgia: Secondary | ICD-10-CM | POA: Diagnosis not present

## 2022-06-13 DIAGNOSIS — M5459 Other low back pain: Secondary | ICD-10-CM

## 2022-06-13 DIAGNOSIS — R293 Abnormal posture: Secondary | ICD-10-CM | POA: Diagnosis not present

## 2022-06-13 NOTE — Therapy (Unsigned)
OUTPATIENT PHYSICAL THERAPY THORACOLUMBAR EVALUATION   Patient Name: Michael Garcia. MRN: [id number] DOB:[birth year], 66 y.o., male Today's Date: 06/14/2022   PT End of Session - 06/14/22 0807     Visit Number 1    Number of Visits 12    Date for PT Re-Evaluation 08/09/22    Authorization Type [employer]    PT Start Time 1515    PT Stop Time 1610    PT Time Calculation (min) 55 min    Activity Tolerance Patient tolerated treatment well    Behavior During Therapy WFL for tasks assessed/performed             Past Medical History:  Diagnosis Date   Anxiety    Benign non-nodular prostatic hyperplasia with lower urinary tract symptoms 11/21/2017   Bipolar 1 disorder, depressed ([place]) 07/25/2020   Cervical spondylosis without myelopathy 04/29/2014   Cervical stenosis of spinal canal 11/12/2008   Chronic headaches    Chronic myofascial pain 11/21/2017   Colon polyps 07/25/2020   Colonoscopy 04/2020, [place], benign, recall in 5 years   Degenerative disc disease, cervical 04/29/2014   Erectile disorder, acquired, generalized, severe 11/21/2017   GAD (generalized anxiety disorder) 03/29/2016   Gastro-esophageal reflux disease with esophagitis 11/21/2017   Glaucoma 11/21/2017   Incomplete right bundle branch block 10/18/2016   Insomnia, persistent 8/54/6270   Lichen planus, penis 11/21/2017   Nondependent alcohol abuse, in remission 03/29/2016   Nonintractable migraine, unspecified migraine type 11/21/2017   Peyronie disease 11/12/2016   Status post cervical spinal fusion 04/29/2014   Thyroid nodule 11/21/2017   benign by FNA 04/2020   Past Surgical History:  Procedure Laterality Date   CERVICAL SPINE SURGERY  2010   c6-c7 fusion   HERNIA REPAIR     Patient Active Problem List   Diagnosis Date Noted   Mixed hyperlipidemia 01/15/2022   Prediabetes 01/15/2022   Bipolar 2 disorder ([place]) 07/25/2020   Colon polyps 07/25/2020   Stricture of anterior urethra in male 09/11/2018    Schwannoma of spinal cord ([place]) 04/22/2018   Benign non-nodular prostatic hyperplasia with lower urinary tract symptoms 11/21/2017   Erectile dysfunction due to arterial insufficiency 11/21/2017   Gastritis 35/00/9381   Lichen planus 82/99/3716   Migraine headache 11/21/2017   Nontoxic thyroid nodule 11/21/2017   Glaucoma 11/21/2017   Chronic myofascial pain 11/21/2017   Peyronie's disease 11/12/2016   Incomplete right bundle branch block 10/18/2016   GAD (generalized anxiety disorder) 03/29/2016   Insomnia, persistent 03/29/2016   Nondependent alcohol abuse, in remission 03/29/2016   Cervical spondylosis without myelopathy 04/29/2014   Status post cervical spinal fusion 04/29/2014   Cervical stenosis of spinal canal 11/12/2008    PCP: Leamon Arnt, MD  REFERRING PROVIDER: Lorine Bears, NP  REFERRING DIAG: [phone] (ICD-10-CM) - Chronic bilateral low back pain without sciatica M47.816 (ICD-10-CM) - Spondylosis without myelopathy or radiculopathy, lumbar region M47.816 (ICD-10-CM) - Facet hypertrophy of lumbar region M54.2 (ICD-10-CM) - Cervicalgia M54.6,G89.29 (ICD-10-CM) - Chronic midline thoracic back pain M79.18 (ICD-10-CM) - Myofascial pain syndrome M96.1 (ICD-10-CM) - Post laminectomy syndrome  Rationale for Evaluation and Treatment Rehabilitation  THERAPY DIAG:  Other low back pain  Cervicalgia  Muscle weakness (generalized)  Abnormal posture  ONSET DATE: chronic pain  SUBJECTIVE:  SUBJECTIVE STATEMENT: 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   PAIN:  Are you having pain? Yes: NPRS scale: 3-4 today,  can get to 8/10 Pain location: low back to neck Pain description: sore,  Aggravating factors: bending forward, standing Relieving factors: topical analgesics, ice    PRECAUTIONS: previous neck fusion  WEIGHT BEARING RESTRICTIONS No  FALLS:  Has patient fallen in last 6 months? No  OCCUPATION: chaplin  PLOF: Independent  PATIENT GOALS get back stronger   OBJECTIVE:   DIAGNOSTIC FINDINGS:  MRI from 2020 "exhibits disc bulge and facet arthropathy at L4-L5 contributing to mild right and moderate left neural foraminal narrowing, unchanged postoperative appearance from prior T11-L1 laminoplasty and resection of intradural tumor. No evidence of local recurrence. No high grade spinal canal stenosis noted."  PATIENT SURVEYS:  FOTO 53% functional intake, predicted goal is 55%  SCREENING FOR RED FLAGS: Bowel or bladder incontinence: No Spinal tumors: No Cauda equina syndrome: No Compression fracture: No Abdominal aneurysm: No  COGNITION:  Overall cognitive status: Within functional limits for tasks assessed     SENSATION: WFL  MUSCLE LENGTH:  POSTURE: slumped posture, forward head, thoracic kyphosis  PALPATION: TTP with trigger points in Rt side cerval-thoracic paraspinals, scapular area, and upper trap.  Neck ROM At Eval Flexion 30 Extension 10 Lateral flexion Rt 20 deg Lateral flexion left 20 deg Rotation to Rt 60 deg Rotation to left 55 deg  LUMBAR ROM:   Active  AROM  eval  Flexion 80%  Extension 50%  Right lateral flexion WFL  Left lateral flexion WFL  Right rotation WFL  Left rotation WFL   (Blank rows = not tested)  LOWER EXTREMITY ROM:     Active  Right eval Left eval  Hip flexion    Hip extension    Hip abduction    Hip adduction    Hip internal rotation    Hip external rotation    Knee flexion    Knee extension    Ankle dorsiflexion    Ankle plantarflexion    Ankle inversion    Ankle eversion     (Blank rows = not tested)  LOWER  EXTREMITY MMT:     MMT Right eval Left eval  Hip flexion 4 4  Hip extension    Hip abduction 4 4  Hip adduction    Hip internal rotation    Hip external rotation    Knee flexion 5 5  Knee extension 4+ 4+  Ankle dorsiflexion    Ankle plantarflexion    Ankle inversion    Ankle eversion     (Blank rows = not tested)  UE strength grossly WNL except bilat ER 4/5 at eval  LUMBAR SPECIAL TESTS:    FUNCTIONAL TESTS:    GAIT: Comments: WFL    TODAY'S TREATMENT   Manual therapy: Skiled palpation and active compression with Trigger Point Dry-Needling  Treatment instructions: Expect mild to moderate muscle soreness.  Patient Consent Given: Yes Education handout provided: verbal  Muscles treated: Cervical-thoacic mutlifidi/paraspinals, splenius capitus/suboccitals, Rt infraspantus, Rt supraspinatus, Rt upper trap and combined with Estim micro current with 100 frequency to desired intensity Treatment response/outcome: twitch response noted bilaterally    PATIENT EDUCATION:  Education details: HEP,PT plan of care, DN Person educated: Patient Education method: Explanation, Demonstration, Verbal cues, and Handouts Education comprehension: verbalized understanding, returned demonstration, and needs further  education   HOME EXERCISE PROGRAM: Access Code: [email or web address] URL: [email or web address] Date: 06/13/2022 Prepared by: Elsie Ra  Exercises - Standing Lumbar Extension  - 5 x daily - 6 x weekly - 1 sets - 10 reps - 5 hold - Seated Cervical Sidebending Stretch  - 2 x daily - 6 x weekly - 1 sets - 2-3 reps - 30 sec hold - Seated Isometric Cervical Extension  - 2 x daily - 6 x weekly - 1 sets - 10 reps - 5 hold - Standing Row with Anchored Resistance  - 1 x daily - 6 x weekly - 2-3 sets - 10-20 reps - Shoulder extension with resistance - Neutral  - 1 x daily - 6 x weekly - 2-3 sets - 10 reps - Shoulder External Rotation and Scapular Retraction with Resistance   - 1 x daily - 6 x weekly - 3 sets - 10 reps - Standing Shoulder Horizontal Abduction with Resistance  - 1 x daily - 6 x weekly - 3 sets - 10 reps - Deadlift with Resistance  - 1 x daily - 6 x weekly - 2 sets - 10 reps   ASSESSMENT:  CLINICAL IMPRESSION: Patient referred to PT for Chronic back and neck pain with postural weakness.  Of note he has history of Cervical spinal fusion and prior T11-L1 laminoplasty and resection of intradural tumor.  MD recommending "Eval and treat for myofascial pain to lower cervical and mid thoracic back, consider manual treatments and dry needling." Patient will benefit from skilled PT to address below impairments, limitations and improve overall function.  OBJECTIVE IMPAIRMENTS: decreased activity tolerance, difficulty walking, decreased balance, decreased endurance, decreased mobility, decreased ROM, decreased strength, impaired flexibility, impaired UE/LE use, postural dysfunction, and pain.  ACTIVITY LIMITATIONS: bending, lifting, carry, locomotion, cleaning, community activity, driving, and or occupation  PERSONAL FACTORS: PMH: MDD,, cervical stenosis, HA, chronic myofascial pain, GAD, GERD, migraines, cervical spinal fusion;prior T11-L1 laminoplasty and resection of intradural tumor.  are also affecting patient's functional outcome.  REHAB POTENTIAL: Good  CLINICAL DECISION MAKING: Stable/uncomplicated  EVALUATION COMPLEXITY: Low    GOALS: Short term PT Goals Target date: 07/12/2022 Pt will be I and compliant with HEP. Baseline:  Goal status: New Pt will decrease pain by 25% overall Baseline: Goal status: New  Long term PT goals Target date: 08/09/2022 Pt will improve neck and back ROM to [facility] to improve functional mobility Baseline: Goal status: New Pt will improve FOTO to at least 55% functional to show improved function Baseline: Goal status: New Pt will reduce pain by overall 50% overall with usual activity Baseline: Goal status:  New   PLAN: PT FREQUENCY: 1-3 times per week   PT DURATION: 6-8 weeks  PLANNED INTERVENTIONS (unless contraindicated): aquatic PT, Canalith repositioning, cryotherapy, Electrical stimulation, Iontophoresis with 4 mg/ml dexamethasome, Moist heat, traction, Ultrasound, gait training, Therapeutic exercise, balance training, neuromuscular re-education, patient/family education, prosthetic training, manual techniques, passive ROM, dry needling, taping, vasopnuematic device, vestibular, spinal manipulations, joint manipulations  PLAN FOR NEXT SESSION: review HEP, how was DN and repeat if desired, needs postural strength program.     Debbe Odea, PT,DPT 06/14/2022, 8:09 AM

## 2022-06-14 ENCOUNTER — Other Ambulatory Visit: Payer: Self-pay

## 2022-06-14 ENCOUNTER — Encounter: Payer: Self-pay | Admitting: Physical Therapy

## 2022-06-18 ENCOUNTER — Ambulatory Visit: Payer: BC Managed Care – PPO | Admitting: Physical Medicine and Rehabilitation

## 2022-06-20 ENCOUNTER — Encounter: Payer: Self-pay | Admitting: Adult Health

## 2022-06-20 ENCOUNTER — Ambulatory Visit (INDEPENDENT_AMBULATORY_CARE_PROVIDER_SITE_OTHER): Payer: BC Managed Care – PPO | Admitting: Adult Health

## 2022-06-20 ENCOUNTER — Other Ambulatory Visit: Payer: Self-pay | Admitting: Adult Health

## 2022-06-20 VITALS — BP 106/62 | HR 72 | Ht 76.0 in | Wt 202.6 lb

## 2022-06-20 DIAGNOSIS — G43709 Chronic migraine without aura, not intractable, without status migrainosus: Secondary | ICD-10-CM

## 2022-06-20 DIAGNOSIS — R202 Paresthesia of skin: Secondary | ICD-10-CM

## 2022-06-20 MED ORDER — NARATRIPTAN HCL 2.5 MG PO TABS: 2.5000 mg | ORAL_TABLET | ORAL | 11 refills | Status: DC | PRN

## 2022-06-20 MED ORDER — GABAPENTIN 100 MG PO CAPS: 100.0000 mg | ORAL_CAPSULE | Freq: Every day | ORAL | 3 refills | Status: DC

## 2022-06-20 NOTE — Telephone Encounter (Signed)
Hold to fill until after appointment today.

## 2022-06-20 NOTE — Progress Notes (Signed)
PATIENT: Michael Garcia. DOB: [birth year]  REASON FOR VISIT: follow up HISTORY FROM: patient Primary neurologist: Dr. Jannifer Franklin  Chief Complaint  Patient presents with   Follow-up    Pt in 4  Pt states that weakness in legs have improved . Pt states PT helps . Pt states migraines have decreased over the last years . Pt states he has had 6 migraines in the last month      HISTORY OF PRESENT ILLNESS: Today 06/20/22:  Michael Garcia is a 66 year old male is a migraine headache and paresthesia. Returns today for follow-up. Reports that he is having 6 headaches a month. Reports that he has 2-3 back to back and then will go away for a while. This is new for him. Reports headaches used to be on the right side but now having some on the left side.  He is currently happy with using Emgality.  He does not wish to change that medication.  He reports that he did go off of gabapentin due to gait abnormality.  He has been doing physical therapy and his gait has improved.  He continues to have paresthesias and would like to restart gabapentin at a lower dose.  06/15/21:Michael Garcia is a 66 year old male with a history of migraine headaches and paresthesias in the lower extremities.  He returns today for follow-up.  The patient states that he has approximately 6-7 breakthrough headaches a month.  He is currently on Ajovy and Maxalt.  He states that headaches always occur on the right side.  Usually coming up the back of the head and into the temporal region.  He reports that Maxalt typically works well for him.  He is open to trying another medication if it would give him better benefit.  Patient reports that Dr. Ernestina Patches increase his gabapentin to 300 mg 3 times a day.  He typically takes 600 in the morning and 300 at bedtime.  He notices midafternoon he has more discomfort in the legs.  He reports sharp shooting electric-like pain in the legs.  He reports that he had an exacerbation of his back pain and Dr.  Ernestina Patches had to prescribe prednisone which offered him great benefit.   12/06/20: Michael Garcia is a 66 year old male with a history of migraine headaches.  He returns today for follow-up.  He is currently on Ajovy and Maxalt.  Reports that his headaches remain under good control.  States that he has approximately 7-8 migraine headaches a month.  He states the intensity and the longevity of the headache has improved.  He states that 90% of the time he takes Maxalt his headache will resolve in 1 to 2 hours.  Patient reports that since he stopped gabapentin he has noticed more discomfort in the feet.  He would like to restart gabapentin.  Denies any changes with his gait or balance.  He has done 2 rounds of physical therapy and thinks his balance has improved.  He does not use an assistive device when ambulating.    06/02/20: Michael Garcia is a 66 year old male with a history of migraine headaches.  He returns today for follow-up.  He is currently on Emgality and Maxalt.  He reports that he has a headache almost every other day.  The severity of his headaches vary.  He states that he can use Maxalt and that usually offer some benefit.  Typically uses at least 9 tablets a month.  He also uses ice and heat therapy.  He  also has a compounded cream that he rubs on the back of his neck.  In the past he has been on Depakote and gabapentin.  Both of these medications offer good benefit with his headaches but he began having tremor and gait abnormalities.  He returns today for an evaluation.  HISTORY (copied from Dr. Tobey Grim note)  Michael Garcia is a 66 year old right-handed white male with a history of migraine headaches.  He also has a history of a schwannoma resection from the conus medullaris, he is followed through [facility] for this.  He has migraine headaches mainly in the left occipital distribution, the combination of Emgality, Depakote, and gabapentin significantly reduced the headache frequency, he was  only having about 2 headaches a month on this medication regimen.  However, the patient developed significant gait instability on the gabapentin and severe tremors of the upper extremities on Depakote.  He was forced to come off these medications and subsequently his headaches have worsened.  He believes that [employer] worked better [employer] that he is on currently.  He is now having 8 or 9 headache days a month, he may miss work on occasion because of this.  He is having ongoing problems with left leg discomfort secondary to the schwannoma residual.  He will be having MRI of the lumbar spine in the next month.  He has not had any further falls, his handwriting has gotten much better.  He returns for an evaluation.  Maxalt is effective in controlling the headache if he takes it early.  REVIEW OF SYSTEMS: Out of a complete 14 system review of symptoms, the patient complains only of the following symptoms, and all other reviewed systems are negative.  See HPI  ALLERGIES: Allergies  Allergen Reactions   Bacitracin Itching and Rash   Iodine Itching and Rash    IV Iodine    Sulfamethoxazole-Trimethoprim Rash    HOME MEDICATIONS: Outpatient Medications Prior to Visit  Medication Sig Dispense Refill   alfuzosin (UROXATRAL) 10 MG 24 hr tablet TAKE 1 TABLET(10 MG) BY MOUTH DAILY WITH BREAKFAST 90 tablet 0   ALPRAZolam (XANAX) 1 MG tablet Take 1 mg by mouth as needed. Take 1/2 tablet twice daily and 2 tablets at bedtime.  2   buPROPion (WELLBUTRIN XL) 300 MG 24 hr tablet Take 450 mg by mouth daily.     cetirizine (ZYRTEC) 10 MG tablet Take 1 tablet by mouth daily.     Coenzyme Q10 (CO Q 10 PO) Take by mouth.     esomeprazole (NEXIUM) 40 MG capsule TAKE 1 CAPSULE(40 MG) BY MOUTH DAILY 30 capsule 0   FIBER PO Take by mouth.     Galcanezumab-gnlm (EMGALITY) 120 MG/ML SOAJ Inject 120 mg into the skin every 30 (thirty) days. 1.12 mL 11   Glucosamine HCl (GLUCOSAMINE PO) Take by mouth.     lamoTRIgine  (LAMICTAL) 150 MG tablet Take 200 mg by mouth 2 (two) times daily.     Multiple Vitamin (MULTIVITAMIN PO) Take by mouth.     MULTIPLE VITAMIN PO Take by mouth.     rizatriptan (MAXALT) 10 MG tablet TAKE 1 TABLET BY MOUTH AT ONSET OF MIGRAINE. REPEAT IN 2 HOURS AS NEEDED. ONLY 2 TABLET/ 24 HOURS 10 tablet 11   rosuvastatin (CRESTOR) 10 MG tablet Take 1 tablet (10 mg total) by mouth daily. 90 tablet 3   tacrolimus (PROTOPIC) 0.1 % ointment in the morning and at bedtime.     traZODone (DESYREL) 100 MG tablet Take  50-100 mg by mouth at bedtime as needed.     triamcinolone cream (KENALOG) 0.1 %      fluticasone (FLONASE) 50 MCG/ACT nasal spray INSTILL 2 SPRAYS IN EACH NOSTRIL EVERY DAY (Patient not taking: Reported on 06/20/2022) 16 g 5   No facility-administered medications prior to visit.    PAST MEDICAL HISTORY: Past Medical History:  Diagnosis Date   Anxiety    Benign non-nodular prostatic hyperplasia with lower urinary tract symptoms 11/21/2017   Bipolar 1 disorder, depressed ([place]) 07/25/2020   Cervical spondylosis without myelopathy 04/29/2014   Cervical stenosis of spinal canal 11/12/2008   Chronic headaches    Chronic myofascial pain 11/21/2017   Colon polyps 07/25/2020   Colonoscopy 04/2020, [place], benign, recall in 5 years   Degenerative disc disease, cervical 04/29/2014   Erectile disorder, acquired, generalized, severe 11/21/2017   GAD (generalized anxiety disorder) 03/29/2016   Gastro-esophageal reflux disease with esophagitis 11/21/2017   Glaucoma 11/21/2017   Incomplete right bundle branch block 10/18/2016   Insomnia, persistent 03/29/6159   Lichen planus, penis 11/21/2017   Nondependent alcohol abuse, in remission 03/29/2016   Nonintractable migraine, unspecified migraine type 11/21/2017   Peyronie disease 11/12/2016   Status post cervical spinal fusion 04/29/2014   Thyroid nodule 11/21/2017   benign by FNA 04/2020    PAST SURGICAL HISTORY: Past Surgical History:  Procedure  Laterality Date   CERVICAL SPINE SURGERY  2010   c6-c7 fusion   HERNIA REPAIR      FAMILY HISTORY: Family History  Problem Relation Age of Onset   Heart disease Mother    Irritable bowel syndrome Mother    Diabetes Sister    Neuropathy Daughter    Migraines Daughter    Healthy Son     SOCIAL HISTORY: Social History   Socioeconomic History   Marital status: Married    Spouse name: Not on file   Number of children: 2   Years of education: Not on file   Highest education level: Not on file  Occupational History   Occupation: Psychologist, counselling in [place]  Tobacco Use   Smoking status: Former    Types: Cigarettes    Quit date: 1994    Years since quitting: 29.6   Smokeless tobacco: Never  Vaping Use   Vaping Use: Never used  Substance and Sexual Activity   Alcohol use: No   Drug use: No   Sexual activity: Yes  Other Topics Concern   Not on file  Social History Narrative   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 Identifies as gay, came out at age 12; remains married to wife.    Social Determinants of Health   Financial Resource Strain: Not on file  Food Insecurity: Not on file  Transportation Needs: Not on file  Physical Activity: Not on file  Stress: Not on file  Social Connections: Not on file  Intimate Partner Violence: Not on file      PHYSICAL EXAM  Vitals:   06/20/22 0814  BP: 106/62  Pulse: 72  Weight: 202 lb 9.6 oz (91.9 kg)  Height: '6\' 4"'$  (1.93 m)   Body mass index is 24.66 kg/m.  Generalized: Well developed, in no acute distress   Neurological examination  Mentation: Alert oriented to time, place, history taking. Follows all commands speech and language fluent Cranial nerve II-XII: Pupils were equal round reactive to light. Extraocular movements were full, visual field were full on confrontational test.  Head turning and shoulder shrug  were  normal and symmetric. Motor: The motor testing reveals 5 over 5 strength of all 4  extremities. Good symmetric motor tone is noted throughout.  Sensory: Sensory testing is intact to soft touch on all 4 extremities. No evidence of extinction is noted.  Coordination: Cerebellar testing reveals good finger-nose-finger and heel-to-shin bilaterally.  Gait and station: Gait is normal.    DIAGNOSTIC DATA (LABS, IMAGING, TESTING) - I reviewed patient records, labs, notes, testing and imaging myself where available.  Lab Results  Component Value Date   WBC 4.8 06/22/2021   HGB 13.8 06/22/2021   HCT 40.3 06/22/2021   MCV 92.9 06/22/2021   PLT 248.0 06/22/2021      Component Value Date/Time   NA 140 01/15/2022 0844   NA 138 09/14/2019 0929   K 4.3 01/15/2022 0844   CL 101 01/15/2022 0844   CO2 31 01/15/2022 0844   GLUCOSE 96 01/15/2022 0844   BUN 6 01/15/2022 0844   BUN 8 09/14/2019 0929   CREATININE 1.08 01/15/2022 0844   CALCIUM 9.3 01/15/2022 0844   PROT 7.1 01/15/2022 0844   PROT 7.8 09/14/2019 0929   ALBUMIN 4.6 01/15/2022 0844   ALBUMIN 5.0 (H) 09/14/2019 0929   AST 12 01/15/2022 0844   ALT 11 01/15/2022 0844   ALKPHOS 61 01/15/2022 0844   BILITOT 0.5 01/15/2022 0844   BILITOT 0.4 09/14/2019 0929   GFRNONAA >60 07/08/2020 1653   GFRAA >60 07/08/2020 1653   Lab Results  Component Value Date   CHOL 93 01/15/2022   HDL 45.20 01/15/2022   LDLCALC 33 01/15/2022   TRIG 74.0 01/15/2022   CHOLHDL 2 01/15/2022   Lab Results  Component Value Date   HGBA1C 5.7 01/15/2022   Lab Results  Component Value Date   ZOXWRUEA54 098 04/18/2018   Lab Results  Component Value Date   TSH 2.73 06/22/2021      ASSESSMENT AND PLAN 66 y.o. year old male  has a past medical history of Anxiety, Benign non-nodular prostatic hyperplasia with lower urinary tract symptoms (11/21/2017), Bipolar 1 disorder, depressed ([place]) (07/25/2020), Cervical spondylosis without myelopathy (04/29/2014), Cervical stenosis of spinal canal (11/12/2008), Chronic headaches, Chronic myofascial pain  (11/21/2017), Colon polyps (07/25/2020), Degenerative disc disease, cervical (04/29/2014), Erectile disorder, acquired, generalized, severe (11/21/2017), GAD (generalized anxiety disorder) (03/29/2016), Gastro-esophageal reflux disease with esophagitis (11/21/2017), Glaucoma (11/21/2017), Incomplete right bundle branch block (10/18/2016), Insomnia, persistent (11/30/1476), Lichen planus, penis (11/21/2017), Nondependent alcohol abuse, in remission (03/29/2016), Nonintractable migraine, unspecified migraine type (11/21/2017), Peyronie disease (11/12/2016), Status post cervical spinal fusion (04/29/2014), and Thyroid nodule (11/21/2017). here with :  Migraine headaches  Continue  Emgality monthly injection  Will try Amerge for abortive therapy.  If this is not helpful he can go back to Maxalt.  Encouraged him not to take Maxalt and Amerge at the same time   Paresthesias in the lower extremities  Restart gabapentin 100 mg at bedtime. If needed can increase to 100 mg BID.  Advised if his symptoms worsen or he develops new symptoms he should let us know.  He will follow-up in 6 months or sooner if needed   Ward Givens, MSN, NP-C 06/20/2022, 8:30 AM [facility] [address], [place], [place] [address] [phone]

## 2022-06-20 NOTE — Patient Instructions (Addendum)
Your Plan:  Continue Emgality Try Amerge for abortive therapy Do not take Maxalt and Amerge together. If Amerge isn't helpful can switch back to Maxalt  Thank you for coming to see [place] at [facility]. I hope we have been able to provide you high quality care today.  You may receive a patient satisfaction survey over the next few weeks. We would appreciate your feedback and comments so that we may continue to improve ourselves and the health of our patients.  Naratriptan Tablets What is this medication? NARATRIPTAN (NAR a trip tan) treats migraines. It works by blocking pain signals and narrowing blood vessels in the brain. It belongs to a group of medications called triptans. It is not used to prevent migraines. This medicine may be used for other purposes; ask your health care provider or pharmacist if you have questions. COMMON BRAND NAME(S): Amerge What should I tell my care team before I take this medication? They need to know if you have any of these conditions: Cigarette smoker Circulation problems in fingers and toes Diabetes Heart disease High blood pressure High cholesterol History of irregular heartbeat History of stroke Kidney disease Liver disease Stomach or intestine problems An unusual or allergic reaction to naratriptan, other medications, foods, dyes, or preservatives Pregnant or trying to get pregnant Breast-feeding How should I use this medication? Take this medication by mouth with a glass of water. Follow the directions on the prescription label. Do not take it more often than directed. Talk to your care team regarding the use of this medication in children. Special care may be needed. Overdosage: If you think you have taken too much of this medicine contact a poison control center or emergency room at once. NOTE: This medicine is only for you. Do not share this medicine with others. What if I miss a dose? This does not apply. This medication  is not for regular use. What may interact with this medication? Do not take this medication with any of the following: Ergot alkaloids like dihydroergotamine, ergonovine, ergotamine, methylergonovine Certain medications for migraine headache like almotriptan, eletriptan, frovatriptan, naratriptan, rizatriptan, sumatriptan, zolmitriptan This medication may also interact with the following: Certain medications for depression, anxiety, or psychotic disorders This list may not describe all possible interactions. Give your health care provider a list of all the medicines, herbs, non-prescription drugs, or dietary supplements you use. Also tell them if you smoke, drink alcohol, or use illegal drugs. Some items may interact with your medicine. What should I watch for while using this medication? Visit your care team for regular checks on your progress. Tell your care team if your symptoms do not start to get better or if they get worse. You may get drowsy or dizzy. Do not drive, use machinery, or do anything that needs mental alertness until you know how this medication affects you. Do not stand up or sit up quickly, especially if you are an older patient. This reduces the risk of dizzy or fainting spells. Alcohol may interfere with the effect of this medication. Tell your care team right away if you have any change in your eyesight. If you take migraine medications for 10 or more days a month, your migraines may get worse. Keep a diary of headache days and medication use. Contact your care team if your migraine attacks occur more frequently. What side effects may I notice from receiving this medication? Side effects that you should report to your care team as soon as possible: Allergic reactions--skin rash,  itching, hives, swelling of the face, lips, tongue, or throat Burning, pain, tingling, or color changes in the legs or feet Heart attack--pain or tightness in the chest, shoulders, arms, or jaw,  nausea, shortness of breath, cold or clammy skin, feeling faint or lightheaded Heart rhythm changes--fast or irregular heartbeat, dizziness, feeling faint or lightheaded, chest pain, trouble breathing Increase in blood pressure Irritability, confusion, fast or irregular heartbeat, muscle stiffness, twitching muscles, sweating, high fever, seizure, chills, vomiting, diarrhea, which may be signs of serotonin syndrome Raynaud's--cool, numb, or painful fingers or toes that may change color from pale, to blue, to red Seizures Stroke--sudden numbness or weakness of the face, arm, or leg, trouble speaking, confusion, trouble walking, loss of balance or coordination, dizziness, severe headache, change in vision Sudden or severe stomach pain, nausea, vomiting, fever, or bloody diarrhea Vision loss Side effects that usually do not require medical attention (report to your care team if they continue or are bothersome): Dizziness General discomfort or fatigue This list may not describe all possible side effects. Call your doctor for medical advice about side effects. You may report side effects to FDA at 1-800-FDA-1088. Where should I keep my medication? Keep out of the reach of children and pets. Store at room temperature between 20 and 25 degrees C (68 and 77 degrees F). Throw away any unused medication after the expiration date. NOTE: This sheet is a summary. It may not cover all possible information. If you have questions about this medicine, talk to your doctor, pharmacist, or health care provider.  2023 Elsevier/Gold Standard (2007-12-20 00:00:00)

## 2022-06-25 ENCOUNTER — Encounter: Payer: BC Managed Care – PPO | Admitting: Family Medicine

## 2022-06-26 ENCOUNTER — Encounter: Payer: Self-pay | Admitting: Rehabilitative and Restorative Service Providers"

## 2022-06-26 ENCOUNTER — Ambulatory Visit (INDEPENDENT_AMBULATORY_CARE_PROVIDER_SITE_OTHER): Payer: BC Managed Care – PPO | Admitting: Rehabilitative and Restorative Service Providers"

## 2022-06-26 DIAGNOSIS — M542 Cervicalgia: Secondary | ICD-10-CM

## 2022-06-26 DIAGNOSIS — M6281 Muscle weakness (generalized): Secondary | ICD-10-CM

## 2022-06-26 DIAGNOSIS — R293 Abnormal posture: Secondary | ICD-10-CM | POA: Diagnosis not present

## 2022-06-26 DIAGNOSIS — M5459 Other low back pain: Secondary | ICD-10-CM | POA: Diagnosis not present

## 2022-06-26 NOTE — Therapy (Signed)
OUTPATIENT PHYSICAL THERAPY TREATMENT NOTE   Patient Name: Michael Garcia. MRN: [id number] DOB:[birth year], 66 y.o., male Today's Date: 06/26/2022  PCP: Leamon Arnt, MD REFERRING PROVIDER: Lorine Bears, NP  END OF SESSION:   PT End of Session - 06/26/22 1459     Visit Number 2    Number of Visits 12    Date for PT Re-Evaluation 08/09/22    Authorization Type BCBS    PT Start Time 1432    PT Stop Time 1515    PT Time Calculation (min) 43 min    Activity Tolerance Patient tolerated treatment well;No increased pain    Behavior During Therapy [facility] for tasks assessed/performed             Past Medical History:  Diagnosis Date   Anxiety    Benign non-nodular prostatic hyperplasia with lower urinary tract symptoms 11/21/2017   Bipolar 1 disorder, depressed ([place]) 07/25/2020   Cervical spondylosis without myelopathy 04/29/2014   Cervical stenosis of spinal canal 11/12/2008   Chronic headaches    Chronic myofascial pain 11/21/2017   Colon polyps 07/25/2020   Colonoscopy 04/2020, [place], benign, recall in 5 years   Degenerative disc disease, cervical 04/29/2014   Erectile disorder, acquired, generalized, severe 11/21/2017   GAD (generalized anxiety disorder) 03/29/2016   Gastro-esophageal reflux disease with esophagitis 11/21/2017   Glaucoma 11/21/2017   Incomplete right bundle branch block 10/18/2016   Insomnia, persistent 6/44/0347   Lichen planus, penis 11/21/2017   Nondependent alcohol abuse, in remission 03/29/2016   Nonintractable migraine, unspecified migraine type 11/21/2017   Peyronie disease 11/12/2016   Status post cervical spinal fusion 04/29/2014   Thyroid nodule 11/21/2017   benign by FNA 04/2020   Past Surgical History:  Procedure Laterality Date   CERVICAL SPINE SURGERY  2010   c6-c7 fusion   HERNIA REPAIR     Patient Active Problem List   Diagnosis Date Noted   Mixed hyperlipidemia 01/15/2022   Prediabetes 01/15/2022   Bipolar 2 disorder ([place])  07/25/2020   Colon polyps 07/25/2020   Stricture of anterior urethra in male 09/11/2018   Schwannoma of spinal cord ([address]) 04/22/2018   Benign non-nodular prostatic hyperplasia with lower urinary tract symptoms 11/21/2017   Erectile dysfunction due to arterial insufficiency 11/21/2017   Gastritis 42/59/5638   Lichen planus 75/64/3329   Migraine headache 11/21/2017   Nontoxic thyroid nodule 11/21/2017   Glaucoma 11/21/2017   Chronic myofascial pain 11/21/2017   Peyronie's disease 11/12/2016   Incomplete right bundle branch block 10/18/2016   GAD (generalized anxiety disorder) 03/29/2016   Insomnia, persistent 03/29/2016   Nondependent alcohol abuse, in remission 03/29/2016   Cervical spondylosis without myelopathy 04/29/2014   Status post cervical spinal fusion 04/29/2014   Cervical stenosis of spinal canal 11/12/2008    REFERRING DIAG: M54.50,G89.29 (ICD-10-CM) - Chronic bilateral low back pain without sciatica M47.816 (ICD-10-CM) - Spondylosis without myelopathy or radiculopathy, lumbar region M47.816 (ICD-10-CM) - Facet hypertrophy of lumbar region M54.2 (ICD-10-CM) - Cervicalgia M54.6,G89.29 (ICD-10-CM) - Chronic midline thoracic back pain M79.18 (ICD-10-CM) - Myofascial pain syndrome M96.1 (ICD-10-CM) - Post laminectomy syndrome  THERAPY DIAG:  Other low back pain  Cervicalgia  Muscle weakness (generalized)  Abnormal posture  Rationale for Evaluation and Treatment Rehabilitation  PERTINENT HISTORY: MDD, cervical stenosis, HA, chronic myofascial pain, GAD, GERD, migraines, cervical spinal fusion; prior T11-L1 laminoplasty and resection of intradural tumor  PRECAUTIONS: Fusion C6-7  SUBJECTIVE: Hunter reports 3-4X HEP compliance over the past 2 weeks.  PAIN:  Are you having pain? Yes: NPRS scale: 3-4/10 Pain location: Neck and low back Pain description: Ache, sore Aggravating factors: Flexion and prolonged postures Relieving factors: Change of position, ice,  topical medication   OBJECTIVE: (objective measures completed at initial evaluation unless otherwise dated)  OBJECTIVE:    DIAGNOSTIC FINDINGS:  MRI from 2020 "exhibits disc bulge and facet arthropathy at L4-L5 contributing to mild right and moderate left neural foraminal narrowing, unchanged postoperative appearance from prior T11-L1 laminoplasty and resection of intradural tumor. No evidence of local recurrence. No high grade spinal canal stenosis noted."   PATIENT SURVEYS:  FOTO 53% functional intake, predicted goal is 55%   SCREENING FOR RED FLAGS: Bowel or bladder incontinence: No Spinal tumors: No Cauda equina syndrome: No Compression fracture: No Abdominal aneurysm: No   COGNITION:           Overall cognitive status: Within functional limits for tasks assessed                          SENSATION: WFL   MUSCLE LENGTH:   POSTURE: slumped posture, forward head, thoracic kyphosis   PALPATION: TTP with trigger points in Rt side cerval-thoracic paraspinals, scapular area, and upper trap.   Neck ROM At Eval Flexion 30 Extension 10 Lateral flexion Rt 20 deg Lateral flexion left 20 deg Rotation to Rt 60 deg Rotation to left 55 deg   LUMBAR ROM:    Active  AROM  eval  Flexion 80%  Extension 50%  Right lateral flexion WFL  Left lateral flexion WFL  Right rotation WFL  Left rotation WFL   (Blank rows = not tested)   LOWER EXTREMITY ROM:      Active  Right eval Left eval  Hip flexion      Hip extension      Hip abduction      Hip adduction      Hip internal rotation      Hip external rotation      Knee flexion      Knee extension      Ankle dorsiflexion      Ankle plantarflexion      Ankle inversion      Ankle eversion       (Blank rows = not tested)   LOWER EXTREMITY MMT:       MMT Right eval Left eval  Hip flexion 4 4  Hip extension      Hip abduction 4 4  Hip adduction      Hip internal rotation      Hip external rotation      Knee  flexion 5 5  Knee extension 4+ 4+  Ankle dorsiflexion      Ankle plantarflexion      Ankle inversion      Ankle eversion       (Blank rows = not tested)   UE strength grossly WNL except bilat ER 4/5 at eval   LUMBAR SPECIAL TESTS:      FUNCTIONAL TESTS:      GAIT: Comments: WFL       TODAY'S TREATMENT   06/26/2022  Therapeutic Exercises: Lumbar extension (hips forward) 10X 3 seconds Shoulder blade pinches 10X 5 seconds Rows Blue theraband 20X 3 seconds Shoulder extension (palms up) 10X 3 seconds Red theraband Standing Shoulder Horizontal Abduction with Resistance Green 10X (palms up) 3 seconds  Verbally reviewed cervical lateral bending stretch, cervical extension isometrics and shoulder ER with  resistance.  Discussed reviewing dead lifts with body mechanics in future visits.   Functional Activities: Review spine anatomy and relate it to imaging and previous surgeries; review  postural basics   Manual therapy: Skiled palpation and active compression with Trigger Point Dry-Needling  Treatment instructions: Expect mild to moderate muscle soreness.  Patient Consent Given: Yes Education handout provided: verbal  Muscles treated: Cervical-thoacic mutlifidi/paraspinals, splenius capitus/suboccitals, Rt infraspantus, Rt supraspinatus, Rt subscap,Rt upper trap and combined with Estim micro current with 100 frequency to desired intensity Treatment response/outcome: twitch response noted  DN performed by Elsie Ra, PT,DPT   Eval 06/13/2022 Manual therapy: Skiled palpation and active compression with Trigger Point Dry-Needling  Treatment instructions: Expect mild to moderate muscle soreness.  Patient Consent Given: Yes Education handout provided: verbal  Muscles treated: Cervical-thoacic mutlifidi/paraspinals, splenius capitus/suboccitals, Rt infraspantus, Rt supraspinatus, Rt upper trap and combined with Estim micro current with 100 frequency to desired intensity Treatment  response/outcome: twitch response noted bilaterally       PATIENT EDUCATION:  Education details: HEP,PT plan of care, DN Person educated: Patient Education method: Explanation, Demonstration, Verbal cues, and Handouts Education comprehension: verbalized understanding, returned demonstration, and needs further education     HOME EXERCISE PROGRAM: Access Code: [email or web address] URL: [email or web address] Date: 06/26/2022 Prepared by: Vista Mink  Exercises - Standing Lumbar Extension  - 5 x daily - 6 x weekly - 1 sets - 10 reps - 5 hold - Seated Cervical Sidebending Stretch  - 2 x daily - 6 x weekly - 1 sets - 2-3 reps - 30 sec hold - Seated Isometric Cervical Extension  - 2 x daily - 6 x weekly - 1 sets - 10 reps - 5 hold - Standing Row with Anchored Resistance  - 1 x daily - 6 x weekly - 2-3 sets - 10-20 reps - Shoulder extension with resistance - Neutral  - 1 x daily - 6 x weekly - 2-3 sets - 10 reps - Shoulder External Rotation and Scapular Retraction with Resistance  - 1 x daily - 6 x weekly - 3 sets - 10 reps - Standing Shoulder Horizontal Abduction with Resistance  - 1 x daily - 6 x weekly - 3 sets - 10 reps - Deadlift with Resistance  - 1 x daily - 6 x weekly - 2 sets - 10 reps - Standing Scapular Retraction  - 5 x daily - 7 x weekly - 1 sets - 5 reps - 5 second hold - Standing Lumbar Extension at Wall - Forearms  - 5 x daily - 7 x weekly - 1 sets - 5 reps - 3 seconds hold     ASSESSMENT:   CLINICAL IMPRESSION: Yong Channel had great questions about his early exercises and about how they would help his current pain and functional limitations.  He had good results with early DN and we were able to review all day 1 HEP exercises either practically or briefly verbally after his DN.  Continued postural education, practical body mechanics and spine/postural strengthening is recommended with future visits.   OBJECTIVE IMPAIRMENTS: decreased activity tolerance, difficulty walking,  decreased balance, decreased endurance, decreased mobility, decreased ROM, decreased strength, impaired flexibility, impaired UE/LE use, postural dysfunction, and pain.   ACTIVITY LIMITATIONS: bending, lifting, carry, locomotion, cleaning, community activity, driving, and or occupation   PERSONAL FACTORS: PMH: MDD,, cervical stenosis, HA, chronic myofascial pain, GAD, GERD, migraines, cervical spinal fusion;prior T11-L1 laminoplasty and resection of intradural tumor.  are also affecting patient's functional outcome.  REHAB POTENTIAL: Good   CLINICAL DECISION MAKING: Stable/uncomplicated   EVALUATION COMPLEXITY: Low       GOALS: Short term PT Goals Target date: 07/12/2022 Pt will be I and compliant with HEP. Baseline:  Goal status: On Going 06/26/2022 Pt will decrease pain by 25% overall Baseline: Goal status: On Going 06/26/2022   Long term PT goals Target date: 08/09/2022 Pt will improve neck and back ROM to [facility] to improve functional mobility Baseline: Goal status: New Pt will improve FOTO to at least 55% functional to show improved function Baseline: Goal status: New Pt will reduce pain by overall 50% overall with usual activity Baseline: Goal status: New     PLAN: PT FREQUENCY: 1-3 times per week    PT DURATION: 6-8 weeks   PLANNED INTERVENTIONS (unless contraindicated): aquatic PT, Canalith repositioning, cryotherapy, Electrical stimulation, Iontophoresis with 4 mg/ml dexamethasome, Moist heat, traction, Ultrasound, gait training, Therapeutic exercise, balance training, neuromuscular re-education, patient/family education, prosthetic training, manual techniques, passive ROM, dry needling, taping, vasopnuematic device, vestibular, spinal manipulations, joint manipulations   PLAN FOR NEXT SESSION: Review HEP, DN, needs postural strength progressions as appropriate along with practical posture and body mechanics work      [place], PT, MPT 06/26/2022, 4:23 PM

## 2022-06-27 ENCOUNTER — Ambulatory Visit (INDEPENDENT_AMBULATORY_CARE_PROVIDER_SITE_OTHER): Payer: BC Managed Care – PPO | Admitting: Family Medicine

## 2022-06-27 ENCOUNTER — Encounter: Payer: Self-pay | Admitting: Family Medicine

## 2022-06-27 VITALS — BP 118/70 | HR 71 | Temp 98.5°F | Ht 76.0 in | Wt 201.8 lb

## 2022-06-27 DIAGNOSIS — R7303 Prediabetes: Secondary | ICD-10-CM | POA: Diagnosis not present

## 2022-06-27 DIAGNOSIS — F3181 Bipolar II disorder: Secondary | ICD-10-CM | POA: Diagnosis not present

## 2022-06-27 DIAGNOSIS — E782 Mixed hyperlipidemia: Secondary | ICD-10-CM | POA: Diagnosis not present

## 2022-06-27 DIAGNOSIS — Z0000 Encounter for general adult medical examination without abnormal findings: Secondary | ICD-10-CM

## 2022-06-27 DIAGNOSIS — D334 Benign neoplasm of spinal cord: Secondary | ICD-10-CM | POA: Diagnosis not present

## 2022-06-27 DIAGNOSIS — H6523 Chronic serous otitis media, bilateral: Secondary | ICD-10-CM

## 2022-06-27 DIAGNOSIS — H9192 Unspecified hearing loss, left ear: Secondary | ICD-10-CM

## 2022-06-27 DIAGNOSIS — J309 Allergic rhinitis, unspecified: Secondary | ICD-10-CM

## 2022-06-27 LAB — COMPREHENSIVE METABOLIC PANEL
ALT: 10 U/L (ref 0–53)
AST: 14 U/L (ref 0–37)
Albumin: 4.7 g/dL (ref 3.5–5.2)
Alkaline Phosphatase: 51 U/L (ref 39–117)
BUN: 9 mg/dL (ref 6–23)
CO2: 31 mEq/L (ref 19–32)
Calcium: 9.7 mg/dL (ref 8.4–10.5)
Chloride: 100 mEq/L (ref 96–112)
Creatinine, Ser: 1 mg/dL (ref 0.40–1.50)
GFR: 79.74 mL/min (ref >60.00)
Glucose, Bld: 97 mg/dL (ref 70–99)
Potassium: 4.9 mEq/L (ref 3.5–5.1)
Sodium: 139 mEq/L (ref 135–145)
Total Bilirubin: 0.5 mg/dL (ref 0.2–1.2)
Total Protein: 7.2 g/dL (ref 6.0–8.3)

## 2022-06-27 LAB — CBC WITH DIFFERENTIAL/PLATELET
Basophils Absolute: 0 10*3/uL (ref 0.0–0.1)
Basophils Relative: 0.9 % (ref 0.0–3.0)
Eosinophils Absolute: 0.2 10*3/uL (ref 0.0–0.7)
Eosinophils Relative: 4 % (ref 0.0–5.0)
HCT: 39.7 % (ref 39.0–52.0)
Hemoglobin: 13.4 g/dL (ref 13.0–17.0)
Lymphocytes Relative: 29.7 % (ref 12.0–46.0)
Lymphs Abs: 1.2 10*3/uL (ref 0.7–4.0)
MCHC: 33.6 g/dL (ref 30.0–36.0)
MCV: 98.2 fl (ref 78.0–100.0)
Monocytes Absolute: 0.3 10*3/uL (ref 0.1–1.0)
Monocytes Relative: 7.2 % (ref 3.0–12.0)
Neutro Abs: 2.4 10*3/uL (ref 1.4–7.7)
Neutrophils Relative %: 58.2 % (ref 43.0–77.0)
Platelets: 194 10*3/uL (ref 150.0–400.0)
RBC: 4.04 Mil/uL — ABNORMAL LOW (ref 4.22–5.81)
RDW: 13.8 % (ref 11.5–15.5)
WBC: 4.2 10*3/uL (ref 4.0–10.5)

## 2022-06-27 LAB — LIPID PANEL
Cholesterol: 128 mg/dL (ref 0–200)
HDL: 65.9 mg/dL (ref >39.00)
LDL Cholesterol: 48 mg/dL (ref 0–99)
NonHDL: 62.11
Total CHOL/HDL Ratio: 2
Triglycerides: 70 mg/dL (ref 0.0–149.0)
VLDL: 14 mg/dL (ref 0.0–40.0)

## 2022-06-27 LAB — HEMOGLOBIN A1C: Hgb A1c MFr Bld: 5.6 % (ref 4.6–6.5)

## 2022-06-27 LAB — TSH: TSH: 1.58 u[IU]/mL (ref 0.35–5.50)

## 2022-06-27 MED ORDER — FLUTICASONE PROPIONATE 50 MCG/ACT NA SUSP: 1.0000 | Freq: Every day | NASAL | 11 refills | Status: AC

## 2022-06-27 NOTE — Patient Instructions (Signed)
Please return in 12 months for your annual complete physical; please come fasting.  ? ?I will release your lab results to you on your MyChart account with further instructions. You may see the results before I do, but when I review them I will send you a message with my report or have my assistant call you if things need to be discussed. Please reply to my message with any questions. Thank you!  ? ?If you have any questions or concerns, please don't hesitate to send me a message via MyChart or call the office at [phone]. Thank you for visiting with us today! It's our pleasure caring for you.  ? ?Please do these things to maintain good health! ? ?Exercise at least 30-45 minutes a day,  4-5 days a week.  ?Eat a low-fat diet with lots of fruits and vegetables, up to 7-9 servings per day. ?Drink plenty of water daily. Try to drink 8 8oz glasses per day. ?Seatbelts can save your life. Always wear your seatbelt. ?Place Smoke Detectors on every level of your home and check batteries every year. ?Eye Doctor - have an eye exam every 1-2 years ?Safe sex - use condoms to protect yourself from STDs if you could be exposed to these types of infections. ?Avoid heavy alcohol use. If you drink, keep it to less than 2 drinks/day and not every day. ?Health Care Power of Attorney.  Choose someone you trust that could speak for you if you became unable to speak for yourself. ?Depression is common in our stressful world.If you're feeling down or losing interest in things you normally enjoy, please come in for a visit.  ?

## 2022-06-27 NOTE — Progress Notes (Signed)
Subjective  Chief Complaint  Patient presents with   Annual Exam    Pt here for Annual Exam and is currently fasting. Pt also stated that he has noticed that his hearing has de creased in the last 71mo    HPI: Michael Garcia is a 66y.o. male [facility]. He also has the concerns and/or needs as listed above in the chief complaint. These will be addressed in addition to the Health Maintenance Visit.   Wellness Visit: annual visit with health maintenance review and exam   Health maintenance: Screens are current.  Immunizations are up-to-date.  Body mass index is 24.56 kg/m. Wt Readings from Last 3 Encounters:  06/27/22 201 lb 12.8 oz (91.5 kg)  06/20/22 202 lb 9.6 oz (91.9 kg)  03/26/22 217 lb (98.4 kg)     Chronic disease management visit and/or acute problem visit: Multiple chronic medical problems discussed and reviewed including bipolar disorder, chronic pain due to DJD and schwannoma.  Urethral stricture with history of hematuria.  Reviewed specialist notes.  These problems are currently stable.  Reviewed current medications.  See medication list. Complains of decreased hearing in the left ear over the last month or so.  Does have allergies.  No pain or fever. Hyperlipidemia on statin fasting for recheck.  Levels have been well controlled.  No adverse effects. History of prediabetes: Continues to try to eat a healthy diet.  No symptoms of hypoglycemia. Reviewed neurology notes: Medications have been changed for migraine management.  Patient Active Problem List   Diagnosis Date Noted   [place] 07/25/2020   [place] 04/22/2018   Migraine headache 11/21/2017   Chronic myofascial pain 11/21/2017   GAD (generalized anxiety disorder) 03/29/2016   Cervical spondylosis without myelopathy 04/29/2014   Cervical stenosis of spinal canal 11/12/2008   Colon polyps  07/25/2020   Benign non-nodular prostatic hyperplasia with lower urinary tract symptoms 11/21/2017   Gastritis 11/21/2017   Incomplete right bundle branch block 10/18/2016   Insomnia, persistent 03/29/2016   Stricture of anterior urethra in male 09/11/2018   Erectile dysfunction due to arterial insufficiency 002/58/5277  Lichen planus 082/42/3536  Nontoxic thyroid nodule 11/21/2017   Glaucoma 11/21/2017   Peyronie's disease 11/12/2016   Nondependent alcohol abuse, in remission 03/29/2016   Status post cervical spinal fusion 04/29/2014   Mixed hyperlipidemia 01/15/2022   Prediabetes 01/15/2022   Health Maintenance  Topic Date Due   INFLUENZA VACCINE  06/12/2022   COVID-19 Vaccine (6 - Moderna risk series) 07/13/2022 (Originally 10/18/2021)   COLONOSCOPY (Pts 45-435yrInsurance coverage will need to be confirmed)  03/03/2025   TETANUS/TDAP  08/06/2027   Hepatitis C Screening  Completed   HIV Screening  Completed   Zoster Vaccines- Shingrix  Completed   HPV VACCINES  Aged Out   Immunization History  Administered Date(s) Administered   Hepatitis A 10/23/2016, 04/24/2017   Influenza Split 11/22/2015   Influenza,inj,Quad PF,6+ Mos 07/21/2018, 07/25/2020   Influenza-Unspecified 09/24/2016, 10/23/2017, 08/23/2021   Moderna Covid-19 Vaccine Bivalent Booster 1868yr up 08/23/2021   Moderna Sars-Covid-2 Vaccination 11/19/2019, 12/18/2019, 08/27/2020, 04/22/2021   Pneumococcal Polysaccharide-23 05/16/2018   Tdap 08/05/2017   Zoster Recombinat (Shingrix) 07/25/2020, 02/01/2021   We updated and reviewed the patient's past history in detail and it is documented below. Allergies: Patient is allergic to bacitracin, iodine, and sulfamethoxazole-trimethoprim. Past Medical History  has a past  medical history of Anxiety, Benign non-nodular prostatic hyperplasia with lower urinary tract symptoms (11/21/2017), Bipolar 1 disorder, depressed ([place]) (07/25/2020), Cervical spondylosis without myelopathy  (04/29/2014), Cervical stenosis of spinal canal (11/12/2008), Chronic headaches, Chronic myofascial pain (11/21/2017), Colon polyps (07/25/2020), Degenerative disc disease, cervical (04/29/2014), Erectile disorder, acquired, generalized, severe (11/21/2017), GAD (generalized anxiety disorder) (03/29/2016), Gastro-esophageal reflux disease with esophagitis (11/21/2017), Glaucoma (11/21/2017), Incomplete right bundle branch block (10/18/2016), Insomnia, persistent (2/58/5277), Lichen planus, penis (11/21/2017), Nondependent alcohol abuse, in remission (03/29/2016), Nonintractable migraine, unspecified migraine type (11/21/2017), Peyronie disease (11/12/2016), Status post cervical spinal fusion (04/29/2014), and Thyroid nodule (11/21/2017). Past Surgical History Patient  has a past surgical history that includes Hernia repair and Cervical spine surgery (2010). Social History Patient  reports that he quit smoking about 29 years ago. His smoking use included cigarettes. He has never used smokeless tobacco. He reports that he does not drink alcohol and does not use drugs. Family History family history includes Diabetes in his sister; Healthy in his son; Heart disease in his mother; Irritable bowel syndrome in his mother; Migraines in his daughter; Neuropathy in his daughter. Review of Systems: Constitutional: negative for fever or malaise Ophthalmic: negative for photophobia, double vision or loss of vision Cardiovascular: negative for chest pain, dyspnea on exertion, or new LE swelling Respiratory: negative for SOB or persistent cough Gastrointestinal: negative for abdominal pain, change in bowel habits or melena Genitourinary: negative for dysuria or gross hematuria Musculoskeletal: negative for new gait disturbance or muscular weakness Integumentary: negative for new or persistent rashes Neurological: negative for TIA or stroke symptoms Psychiatric: negative for SI or delusions Allergic/Immunologic: negative for  hives  Patient Care Team    Relationship Specialty Notifications Start End  Leamon Arnt, MD PCP - General Family Medicine  07/25/20   Magnus Sinning, MD Consulting Physician Physical Medicine and Rehabilitation  04/17/18   Alexis Frock, MD Consulting Physician Urology  07/22/18   Chauncey Mann, MD Referring Physician Psychiatry  11/18/18   Kathrynn Ducking, MD (Inactive) Consulting Physician Neurology  07/25/20   Wonda Horner, MD Referring Physician Neurosurgery  08/01/20   Lorenza Evangelist, MD Referring Physician Urology  10/17/21    Objective  Vitals: BP 118/70   Pulse 71   Temp 98.5 F (36.9 C)   Ht '6\' 4"'$  (1.93 m)   Wt 201 lb 12.8 oz (91.5 kg)   SpO2 97%   BMI 24.56 kg/m  General:  Well developed, well nourished, no acute distress  Psych:  Alert and orientedx3,normal mood and affect HEENT:  Normocephalic, atraumatic, non-icteric sclera, PERRL, oropharynx is clear without mass or exudate, supple neck without adenopathy, mass or thyromegaly Cardiovascular:  Normal S1, S2, RRR without gallop, rub or murmur, Respiratory:  Good breath sounds bilaterally, CTAB with normal respiratory effort Gastrointestinal: normal bowel sounds, soft, non-tender, no noted masses. No HSM Skin:  Warm, no rashes or suspicious lesions noted Neurologic:    Mental status is normal.    Assessment  1. Annual physical exam   2. Schwannoma of spinal cord ([place])   3. Bipolar 2 disorder ([place])   4. Mixed hyperlipidemia   5. Prediabetes   6. Chronic allergic rhinitis   7. Decreased hearing of left ear   8. Bilateral chronic serous otitis media      Plan  Male Wellness Visit: Age appropriate Health Maintenance and Prevention measures were discussed with patient. Included topics are cancer screening recommendations, ways to keep healthy (see AVS) including dietary and exercise recommendations,  regular eye and dental care, use of seat belts, and avoidance of moderate alcohol use and tobacco  use.  BMI: discussed patient's BMI and encouraged positive lifestyle modifications to help get to or maintain a target BMI. HM needs and immunizations were addressed and ordered. See below for orders. See HM and immunization section for updates. Routine labs and screening tests ordered including cmp, cbc and lipids where appropriate. Discussed recommendations regarding Vit D and calcium supplementation (see AVS)  Chronic disease f/u and/or acute problem visit: (deemed necessary to be done in addition to the wellness visit): Chronic back pain, schwannoma: Treated by neurosurgery recently with epidural injections.  Stabilizing.  Undergoing PT. Wound management per psychiatry.  He reports this is stable.  Reviewed multiple medications. Allergies with bilateral serous effusions and decreased hearing on left: Start Flonase daily.  Recheck in 3 months if not improving.  Education given. Prediabetes: Continue healthy diet and recheck A1c today.  Check fasting glucose.  Follow up: Return in about 1 year (around 06/28/2023) for complete physical.  Commons side effects, risks, benefits, and alternatives for medications and treatment plan prescribed today were discussed, and the patient expressed understanding of the given instructions. Patient is instructed to call or message via MyChart if he/she has any questions or concerns regarding our treatment plan. No barriers to understanding were identified. We discussed Red Flag symptoms and signs in detail. Patient expressed understanding regarding what to do in case of urgent or emergency type symptoms.  Medication list was reconciled, printed and provided to the patient in AVS. Patient instructions and summary information was reviewed with the patient as documented in the AVS. This note was prepared with assistance of Dragon voice recognition software. Occasional wrong-word or sound-a-like substitutions may have occurred due to the inherent limitations of voice  recognition software  This visit occurred during the SARS-CoV-2 public health emergency.  Safety protocols were in place, including screening questions prior to the visit, additional usage of staff PPE, and extensive cleaning of exam room while observing appropriate contact time as indicated for disinfecting solutions.   Orders Placed This Encounter  Procedures   CBC with Differential/Platelet   Comprehensive metabolic panel   Lipid panel   Hemoglobin A1c   TSH   Meds ordered this encounter  Medications   fluticasone (FLONASE) 50 MCG/ACT nasal spray    Sig: Place 1 spray into both nostrils daily.    Dispense:  16 g    Refill:  11

## 2022-07-03 ENCOUNTER — Telehealth: Payer: Self-pay | Admitting: Physical Therapy

## 2022-07-03 ENCOUNTER — Encounter: Payer: BC Managed Care – PPO | Admitting: Physical Therapy

## 2022-07-03 DIAGNOSIS — F411 Generalized anxiety disorder: Secondary | ICD-10-CM | POA: Diagnosis not present

## 2022-07-03 DIAGNOSIS — F3132 Bipolar disorder, current episode depressed, moderate: Secondary | ICD-10-CM | POA: Diagnosis not present

## 2022-07-03 NOTE — Telephone Encounter (Signed)
Pt did not show for PT appointment today. They were contacted and informed of this by phone and he states he had the wrong time on the calender that he thought it was at 330 pm instead of 230 pm. They were provided the date and time of their next appointment.   Elsie Ra, PT, DPT 07/03/22 2:52 PM

## 2022-07-04 ENCOUNTER — Other Ambulatory Visit: Payer: Self-pay | Admitting: Adult Health

## 2022-07-05 ENCOUNTER — Ambulatory Visit (INDEPENDENT_AMBULATORY_CARE_PROVIDER_SITE_OTHER): Payer: BC Managed Care – PPO | Admitting: Physical Therapy

## 2022-07-05 ENCOUNTER — Encounter: Payer: Self-pay | Admitting: Physical Therapy

## 2022-07-05 DIAGNOSIS — M542 Cervicalgia: Secondary | ICD-10-CM

## 2022-07-05 DIAGNOSIS — M5459 Other low back pain: Secondary | ICD-10-CM | POA: Diagnosis not present

## 2022-07-05 DIAGNOSIS — M6281 Muscle weakness (generalized): Secondary | ICD-10-CM | POA: Diagnosis not present

## 2022-07-05 DIAGNOSIS — R293 Abnormal posture: Secondary | ICD-10-CM | POA: Diagnosis not present

## 2022-07-05 NOTE — Therapy (Signed)
OUTPATIENT PHYSICAL THERAPY TREATMENT NOTE   Patient Name: Michael Garcia. MRN: [id number] DOB:1956-06-05, 66 y.o., male Today's Date: 07/05/2022  PCP: Leamon Arnt, MD REFERRING PROVIDER: Lorine Bears, NP  END OF SESSION:   PT End of Session - 07/05/22 1444     Visit Number 3    Number of Visits 12    Date for PT Re-Evaluation 08/09/22    Authorization Type [employer]    PT Start Time 1425    PT Stop Time 1505    PT Time Calculation (min) 40 min    Activity Tolerance Patient tolerated treatment well;No increased pain    Behavior During Therapy [facility] for tasks assessed/performed             Past Medical History:  Diagnosis Date   Anxiety    Benign non-nodular prostatic hyperplasia with lower urinary tract symptoms 11/21/2017   Bipolar 1 disorder, depressed ([place]) 07/25/2020   Cervical spondylosis without myelopathy 04/29/2014   Cervical stenosis of spinal canal 11/12/2008   Chronic headaches    Chronic myofascial pain 11/21/2017   Colon polyps 07/25/2020   Colonoscopy 04/2020, [place], benign, recall in 5 years   Degenerative disc disease, cervical 04/29/2014   Erectile disorder, acquired, generalized, severe 11/21/2017   GAD (generalized anxiety disorder) 03/29/2016   Gastro-esophageal reflux disease with esophagitis 11/21/2017   Glaucoma 11/21/2017   Incomplete right bundle branch block 10/18/2016   Insomnia, persistent 02/18/8118   Lichen planus, penis 11/21/2017   Nondependent alcohol abuse, in remission 03/29/2016   Nonintractable migraine, unspecified migraine type 11/21/2017   Peyronie disease 11/12/2016   Status post cervical spinal fusion 04/29/2014   Thyroid nodule 11/21/2017   benign by FNA 04/2020   Past Surgical History:  Procedure Laterality Date   CERVICAL SPINE SURGERY  2010   c6-c7 fusion   HERNIA REPAIR     Patient Active Problem List   Diagnosis Date Noted   Mixed hyperlipidemia 01/15/2022   Prediabetes 01/15/2022   Bipolar 2 disorder ([place])  07/25/2020   Colon polyps 07/25/2020   Stricture of anterior urethra in male 09/11/2018   Schwannoma of spinal cord ([place]) 04/22/2018   Benign non-nodular prostatic hyperplasia with lower urinary tract symptoms 11/21/2017   Erectile dysfunction due to arterial insufficiency 11/21/2017   Gastritis 14/78/2956   Lichen planus 21/30/8657   Migraine headache 11/21/2017   Nontoxic thyroid nodule 11/21/2017   Glaucoma 11/21/2017   Chronic myofascial pain 11/21/2017   Peyronie's disease 11/12/2016   Incomplete right bundle branch block 10/18/2016   GAD (generalized anxiety disorder) 03/29/2016   Insomnia, persistent 03/29/2016   Nondependent alcohol abuse, in remission 03/29/2016   Cervical spondylosis without myelopathy 04/29/2014   Status post cervical spinal fusion 04/29/2014   Cervical stenosis of spinal canal 11/12/2008    REFERRING DIAG: M54.50,G89.29 (ICD-10-CM) - Chronic bilateral low back pain without sciatica M47.816 (ICD-10-CM) - Spondylosis without myelopathy or radiculopathy, lumbar region M47.816 (ICD-10-CM) - Facet hypertrophy of lumbar region M54.2 (ICD-10-CM) - Cervicalgia M54.6,G89.29 (ICD-10-CM) - Chronic midline thoracic back pain M79.18 (ICD-10-CM) - Myofascial pain syndrome M96.1 (ICD-10-CM) - Post laminectomy syndrome  THERAPY DIAG:  Other low back pain  Cervicalgia  Muscle weakness (generalized)  Abnormal posture  Rationale for Evaluation and Treatment Rehabilitation  PERTINENT HISTORY: MDD, cervical stenosis, HA, chronic myofascial pain, GAD, GERD, migraines, cervical spinal fusion; prior T11-L1 laminoplasty and resection of intradural tumor  PRECAUTIONS: Fusion C6-7  SUBJECTIVE: He relays the pain is getting better, he feels the DN  is helping.  PAIN:  Are you having pain? Yes: NPRS scale: 3/10 Pain location: Neck and low back Pain description: Ache, sore Aggravating factors: Flexion and prolonged postures Relieving factors: Change of position, ice,  topical medication   OBJECTIVE: (objective measures completed at initial evaluation unless otherwise dated)  OBJECTIVE:    DIAGNOSTIC FINDINGS:  MRI from 2020 "exhibits disc bulge and facet arthropathy at L4-L5 contributing to mild right and moderate left neural foraminal narrowing, unchanged postoperative appearance from prior T11-L1 laminoplasty and resection of intradural tumor. No evidence of local recurrence. No high grade spinal canal stenosis noted."   PATIENT SURVEYS:  FOTO 53% functional intake, predicted goal is 55%   SCREENING FOR RED FLAGS: Bowel or bladder incontinence: No Spinal tumors: No Cauda equina syndrome: No Compression fracture: No Abdominal aneurysm: No   COGNITION:           Overall cognitive status: Within functional limits for tasks assessed                          SENSATION: WFL   MUSCLE LENGTH:   POSTURE: slumped posture, forward head, thoracic kyphosis   PALPATION: TTP with trigger points in Rt side cerval-thoracic paraspinals, scapular area, and upper trap.   Neck ROM At Eval Flexion 30 Extension 10 Lateral flexion Rt 20 deg Lateral flexion left 20 deg Rotation to Rt 60 deg Rotation to left 55 deg   LUMBAR ROM:    Active  AROM  eval  Flexion 80%  Extension 50%  Right lateral flexion WFL  Left lateral flexion WFL  Right rotation WFL  Left rotation WFL   (Blank rows = not tested)   LOWER EXTREMITY ROM:      Active  Right eval Left eval  Hip flexion      Hip extension      Hip abduction      Hip adduction      Hip internal rotation      Hip external rotation      Knee flexion      Knee extension      Ankle dorsiflexion      Ankle plantarflexion      Ankle inversion      Ankle eversion       (Blank rows = not tested)   LOWER EXTREMITY MMT:       MMT Right eval Left eval  Hip flexion 4 4  Hip extension      Hip abduction 4 4  Hip adduction      Hip internal rotation      Hip external rotation      Knee  flexion 5 5  Knee extension 4+ 4+  Ankle dorsiflexion      Ankle plantarflexion      Ankle inversion      Ankle eversion       (Blank rows = not tested)   UE strength grossly WNL except bilat ER 4/5 at eval   LUMBAR SPECIAL TESTS:      FUNCTIONAL TESTS:      GAIT: Comments: WFL       TODAY'S TREATMENT  07/05/2022 Therapeutic Exercises: UBE L5 X 6 min retro    Shoulder extensions green X 20 Rows green 20X 3 seconds Shoulder ER bilat with green X20 Standing Shoulder Horizontal Abduction Green 20X Supine chin tucks 5 sec X 15 Supine neck extension 5 sec X 15 Supine thoracic extension on foam roll Supine thoracic  rotation X 5   Manual therapy: Skiled palpation and active compression with Trigger Point Dry-Needling  Treatment instructions: Expect mild to moderate muscle soreness.  Patient Consent Given: Yes Muscles treated: Cervical-thoacic mutlifidi/paraspinals, splenius capitus/suboccitals, Rt infraspantus, Rt supraspinatus, Rt subscap,Rt upper trap and combined with Estim micro current with 100 frequency to desired intensity Treatment response/outcome: twitch response noted  DN performed by Elsie Ra, PT,DPT   06/26/2022  Therapeutic Exercises: Lumbar extension (hips forward) 10X 3 seconds Shoulder blade pinches 10X 5 seconds Rows Blue theraband 20X 3 seconds Shoulder extension (palms up) 10X 3 seconds Red theraband Standing Shoulder Horizontal Abduction with Resistance Green 10X (palms up) 3 seconds  Verbally reviewed cervical lateral bending stretch, cervical extension isometrics and shoulder ER with resistance.  Discussed reviewing dead lifts with body mechanics in future visits.   Functional Activities: Review spine anatomy and relate it to imaging and previous surgeries; review  postural basics   Manual therapy: Skiled palpation and active compression with Trigger Point Dry-Needling  Treatment instructions: Expect mild to moderate muscle soreness.   Patient Consent Given: Yes Education handout provided: verbal  Muscles treated: Cervical-thoacic mutlifidi/paraspinals, splenius capitus/suboccitals, Rt infraspantus, Rt supraspinatus, Rt subscap,Rt upper trap and combined with Estim micro current with 100 frequency to desired intensity Treatment response/outcome: twitch response noted  DN performed by Elsie Ra, PT,DPT   Eval 06/13/2022 Manual therapy: Skiled palpation and active compression with Trigger Point Dry-Needling  Treatment instructions: Expect mild to moderate muscle soreness.  Patient Consent Given: Yes Education handout provided: verbal  Muscles treated: Cervical-thoacic mutlifidi/paraspinals, splenius capitus/suboccitals, Rt infraspantus, Rt supraspinatus, Rt upper trap and combined with Estim micro current with 100 frequency to desired intensity Treatment response/outcome: twitch response noted bilaterally       PATIENT EDUCATION:  Education details: HEP,PT plan of care, DN Person educated: Patient Education method: Explanation, Demonstration, Verbal cues, and Handouts Education comprehension: verbalized understanding, returned demonstration, and needs further education     HOME EXERCISE PROGRAM: Access Code: [email or web address] URL: [email or web address] Date: 06/26/2022 Prepared by: Vista Mink  Exercises - Standing Lumbar Extension  - 5 x daily - 6 x weekly - 1 sets - 10 reps - 5 hold - Seated Cervical Sidebending Stretch  - 2 x daily - 6 x weekly - 1 sets - 2-3 reps - 30 sec hold - Seated Isometric Cervical Extension  - 2 x daily - 6 x weekly - 1 sets - 10 reps - 5 hold - Standing Row with Anchored Resistance  - 1 x daily - 6 x weekly - 2-3 sets - 10-20 reps - Shoulder extension with resistance - Neutral  - 1 x daily - 6 x weekly - 2-3 sets - 10 reps - Shoulder External Rotation and Scapular Retraction with Resistance  - 1 x daily - 6 x weekly - 3 sets - 10 reps - Standing Shoulder Horizontal Abduction  with Resistance  - 1 x daily - 6 x weekly - 3 sets - 10 reps - Deadlift with Resistance  - 1 x daily - 6 x weekly - 2 sets - 10 reps - Standing Scapular Retraction  - 5 x daily - 7 x weekly - 1 sets - 5 reps - 5 second hold - Standing Lumbar Extension at Wall - Forearms  - 5 x daily - 7 x weekly - 1 sets - 5 reps - 3 seconds hold     ASSESSMENT:   CLINICAL IMPRESSION: He appears to be making some early progress from DN intervention  and HEP exercises. He does relay headaches are less common but he still does get them. PT recommending to continue current plan of care.    OBJECTIVE IMPAIRMENTS: decreased activity tolerance, difficulty walking, decreased balance, decreased endurance, decreased mobility, decreased ROM, decreased strength, impaired flexibility, impaired UE/LE use, postural dysfunction, and pain.   ACTIVITY LIMITATIONS: bending, lifting, carry, locomotion, cleaning, community activity, driving, and or occupation   PERSONAL FACTORS: PMH: MDD,, cervical stenosis, HA, chronic myofascial pain, GAD, GERD, migraines, cervical spinal fusion;prior T11-L1 laminoplasty and resection of intradural tumor.  are also affecting patient's functional outcome.   REHAB POTENTIAL: Good   CLINICAL DECISION MAKING: Stable/uncomplicated   EVALUATION COMPLEXITY: Low       GOALS: Short term PT Goals Target date: 07/12/2022 Pt will be I and compliant with HEP. Baseline:  Goal status: On Going 06/26/2022 Pt will decrease pain by 25% overall Baseline: Goal status: On Going 06/26/2022   Long term PT goals Target date: 08/09/2022 Pt will improve neck and back ROM to [facility] to improve functional mobility Baseline: Goal status: New Pt will improve FOTO to at least 55% functional to show improved function Baseline: Goal status: New Pt will reduce pain by overall 50% overall with usual activity Baseline: Goal status: New     PLAN: PT FREQUENCY: 1-3 times per week    PT DURATION: 6-8 weeks    PLANNED INTERVENTIONS (unless contraindicated): aquatic PT, Canalith repositioning, cryotherapy, Electrical stimulation, Iontophoresis with 4 mg/ml dexamethasome, Moist heat, traction, Ultrasound, gait training, Therapeutic exercise, balance training, neuromuscular re-education, patient/family education, prosthetic training, manual techniques, passive ROM, dry needling, taping, vasopnuematic device, vestibular, spinal manipulations, joint manipulations   PLAN FOR NEXT SESSION: DN with estim, needs postural strength progressions as appropriate along with practical posture and body mechanics work, thoracic mobility.      Debbe Odea, PT, DPT 07/05/2022, 2:45 PM

## 2022-07-11 ENCOUNTER — Encounter: Payer: Self-pay | Admitting: Rehabilitative and Restorative Service Providers"

## 2022-07-11 ENCOUNTER — Ambulatory Visit (INDEPENDENT_AMBULATORY_CARE_PROVIDER_SITE_OTHER): Payer: BC Managed Care – PPO | Admitting: Rehabilitative and Restorative Service Providers"

## 2022-07-11 DIAGNOSIS — M542 Cervicalgia: Secondary | ICD-10-CM

## 2022-07-11 DIAGNOSIS — R293 Abnormal posture: Secondary | ICD-10-CM | POA: Diagnosis not present

## 2022-07-11 DIAGNOSIS — H40013 Open angle with borderline findings, low risk, bilateral: Secondary | ICD-10-CM | POA: Diagnosis not present

## 2022-07-11 DIAGNOSIS — M5459 Other low back pain: Secondary | ICD-10-CM

## 2022-07-11 DIAGNOSIS — H5021 Vertical strabismus, right eye: Secondary | ICD-10-CM | POA: Diagnosis not present

## 2022-07-11 DIAGNOSIS — M6281 Muscle weakness (generalized): Secondary | ICD-10-CM

## 2022-07-11 DIAGNOSIS — H25813 Combined forms of age-related cataract, bilateral: Secondary | ICD-10-CM | POA: Diagnosis not present

## 2022-07-11 NOTE — Therapy (Signed)
OUTPATIENT PHYSICAL THERAPY TREATMENT NOTE   Patient Name: Michael Garcia. MRN: [id number] DOB:12/13/1955, 66 y.o., male Today's Date: 07/11/2022  PCP: Leamon Arnt, MD REFERRING PROVIDER: Lorine Bears, NP  END OF SESSION:   PT End of Session - 07/11/22 1545     Visit Number 4    Number of Visits 12    Date for PT Re-Evaluation 08/09/22    Authorization Type [employer]    PT Start Time 1513    PT Stop Time 1557    PT Time Calculation (min) 44 min    Activity Tolerance Patient tolerated treatment well;No increased pain    Behavior During Therapy [facility] for tasks assessed/performed              Past Medical History:  Diagnosis Date   Anxiety    Benign non-nodular prostatic hyperplasia with lower urinary tract symptoms 11/21/2017   Bipolar 1 disorder, depressed ([place]) 07/25/2020   Cervical spondylosis without myelopathy 04/29/2014   Cervical stenosis of spinal canal 11/12/2008   Chronic headaches    Chronic myofascial pain 11/21/2017   Colon polyps 07/25/2020   Colonoscopy 04/2020, [place], benign, recall in 5 years   Degenerative disc disease, cervical 04/29/2014   Erectile disorder, acquired, generalized, severe 11/21/2017   GAD (generalized anxiety disorder) 03/29/2016   Gastro-esophageal reflux disease with esophagitis 11/21/2017   Glaucoma 11/21/2017   Incomplete right bundle branch block 10/18/2016   Insomnia, persistent 6/71/2458   Lichen planus, penis 11/21/2017   Nondependent alcohol abuse, in remission 03/29/2016   Nonintractable migraine, unspecified migraine type 11/21/2017   Peyronie disease 11/12/2016   Status post cervical spinal fusion 04/29/2014   Thyroid nodule 11/21/2017   benign by FNA 04/2020   Past Surgical History:  Procedure Laterality Date   CERVICAL SPINE SURGERY  2010   c6-c7 fusion   HERNIA REPAIR     Patient Active Problem List   Diagnosis Date Noted   Mixed hyperlipidemia 01/15/2022   Prediabetes 01/15/2022   Bipolar 2 disorder ([place])  07/25/2020   Colon polyps 07/25/2020   Stricture of anterior urethra in male 09/11/2018   Schwannoma of spinal cord ([place]) 04/22/2018   Benign non-nodular prostatic hyperplasia with lower urinary tract symptoms 11/21/2017   Erectile dysfunction due to arterial insufficiency 11/21/2017   Gastritis 09/98/3382   Lichen planus 50/53/9767   Migraine headache 11/21/2017   Nontoxic thyroid nodule 11/21/2017   Glaucoma 11/21/2017   Chronic myofascial pain 11/21/2017   Peyronie's disease 11/12/2016   Incomplete right bundle branch block 10/18/2016   GAD (generalized anxiety disorder) 03/29/2016   Insomnia, persistent 03/29/2016   Nondependent alcohol abuse, in remission 03/29/2016   Cervical spondylosis without myelopathy 04/29/2014   Status post cervical spinal fusion 04/29/2014   Cervical stenosis of spinal canal 11/12/2008    REFERRING DIAG: M54.50,G89.29 (ICD-10-CM) - Chronic bilateral low back pain without sciatica M47.816 (ICD-10-CM) - Spondylosis without myelopathy or radiculopathy, lumbar region M47.816 (ICD-10-CM) - Facet hypertrophy of lumbar region M54.2 (ICD-10-CM) - Cervicalgia M54.6,G89.29 (ICD-10-CM) - Chronic midline thoracic back pain M79.18 (ICD-10-CM) - Myofascial pain syndrome M96.1 (ICD-10-CM) - Post laminectomy syndrome  THERAPY DIAG:  Other low back pain  Cervicalgia  Muscle weakness (generalized)  Abnormal posture  Rationale for Evaluation and Treatment Rehabilitation  PERTINENT HISTORY: MDD, cervical stenosis, HA, chronic myofascial pain, GAD, GERD, migraines, cervical spinal fusion; prior T11-L1 laminoplasty and resection of intradural tumor  PRECAUTIONS: Fusion C6-7  SUBJECTIVE: Michael Garcia notes that pain continues to improve.  He feels  the DN is helping.  Had good questions regarding body mechanics with his bathroom renovation.  PAIN:  Are you having pain? Yes: NPRS scale: 1-4/10 Pain location: Neck and low back Pain description: Ache, sore Aggravating  factors: Flexion and prolonged postures Relieving factors: Change of position, ice, topical medication   OBJECTIVE: (objective measures completed at initial evaluation unless otherwise dated)  OBJECTIVE:    DIAGNOSTIC FINDINGS:  MRI from 2020 "exhibits disc bulge and facet arthropathy at L4-L5 contributing to mild right and moderate left neural foraminal narrowing, unchanged postoperative appearance from prior T11-L1 laminoplasty and resection of intradural tumor. No evidence of local recurrence. No high grade spinal canal stenosis noted."   PATIENT SURVEYS:  FOTO 53% functional intake, predicted goal is 55%   SCREENING FOR RED FLAGS: Bowel or bladder incontinence: No Spinal tumors: No Cauda equina syndrome: No Compression fracture: No Abdominal aneurysm: No   COGNITION:           Overall cognitive status: Within functional limits for tasks assessed                          SENSATION: WFL   MUSCLE LENGTH:   POSTURE: slumped posture, forward head, thoracic kyphosis   PALPATION: TTP with trigger points in Rt side cerval-thoracic paraspinals, scapular area, and upper trap.   Neck ROM At Eval Flexion 30 Extension 10 Lateral flexion Rt 20 deg Lateral flexion left 20 deg Rotation to Rt 60 deg Rotation to left 55 deg   LUMBAR ROM:    Active  AROM  eval  Flexion 80%  Extension 50%  Right lateral flexion WFL  Left lateral flexion WFL  Right rotation WFL  Left rotation WFL   (Blank rows = not tested)   LOWER EXTREMITY ROM:      Active  Right eval Left eval  Hip flexion      Hip extension      Hip abduction      Hip adduction      Hip internal rotation      Hip external rotation      Knee flexion      Knee extension      Ankle dorsiflexion      Ankle plantarflexion      Ankle inversion      Ankle eversion       (Blank rows = not tested)   LOWER EXTREMITY MMT:       MMT Right eval Left eval  Hip flexion 4 4  Hip extension      Hip abduction 4 4   Hip adduction      Hip internal rotation      Hip external rotation      Knee flexion 5 5  Knee extension 4+ 4+  Ankle dorsiflexion      Ankle plantarflexion      Ankle inversion      Ankle eversion       (Blank rows = not tested)   UE strength grossly WNL except bilat ER 4/5 at eval   LUMBAR SPECIAL TESTS:      FUNCTIONAL TESTS:      GAIT: Comments: WFL       TODAY'S TREATMENT  07/11/2022 Therapeutic Exercises: Lumbar extension (hips forward) 10X 3 seconds Shoulder blade pinches 10X 5 seconds Rows Blue theraband 20X 3 seconds Shoulder extension (palms up) 10X 3 seconds Red theraband Shoulder ER with Resistance Green 10X each side(palms up) 3 seconds Cervical  extension isometrics 10X 5 seconds   Discussed disc pressures in various positions and practiced practical golfer's and diagonal squat lift for use in renovating his bathroom   Trigger Point Dry-Needling  Treatment instructions: Expect mild to moderate muscle soreness. S/S of pneumothorax if dry needled over a lung field, and to seek immediate medical attention should they occur. Patient verbalized understanding of these instructions and education.   Patient Consent Given: Yes  Education handout provided: Previously provided  Muscles treated: Rt upper trap, levator scapula, rhomboids  Electrical stimulation performed: No  Parameters: N/A  Treatment response/outcome: twitch responses noted in all muscle groups   Manual:   STM with compression to Rt upper trap, levator scapula and rhomboids.  Skilled palpation and monitoring of soft tissue during DN.   Dry needling and manual therapy performed by Faustino Congress, PT, DPT.     07/05/2022 Therapeutic Exercises: UBE L5 X 6 min retro    Shoulder extensions green X 20 Rows green 20X 3 seconds Shoulder ER bilat with green X20 Standing Shoulder Horizontal Abduction Green 20X Supine chin tucks 5 sec X 15 Supine neck extension 5 sec X 15 Supine thoracic  extension on foam roll Supine thoracic rotation X 5   Manual therapy: Skiled palpation and active compression with Trigger Point Dry-Needling  Treatment instructions: Expect mild to moderate muscle soreness.  Patient Consent Given: Yes Muscles treated: Cervical-thoacic mutlifidi/paraspinals, splenius capitus/suboccitals, Rt infraspantus, Rt supraspinatus, Rt subscap,Rt upper trap and combined with Estim micro current with 100 frequency to desired intensity Treatment response/outcome: twitch response noted  DN performed by Elsie Ra, PT,DPT    06/26/2022  Therapeutic Exercises: Lumbar extension (hips forward) 10X 3 seconds Shoulder blade pinches 10X 5 seconds Rows Blue theraband 20X 3 seconds Shoulder extension (palms up) 10X 3 seconds Red theraband Standing Shoulder Horizontal Abduction with Resistance Green 10X (palms up) 3 seconds  Verbally reviewed cervical lateral bending stretch, cervical extension isometrics and shoulder ER with resistance.  Discussed reviewing dead lifts with body mechanics in future visits.   Functional Activities: Review spine anatomy and relate it to imaging and previous surgeries; review  postural basics   Manual therapy: Skiled palpation and active compression with Trigger Point Dry-Needling  Treatment instructions: Expect mild to moderate muscle soreness.  Patient Consent Given: Yes Education handout provided: verbal  Muscles treated: Cervical-thoacic mutlifidi/paraspinals, splenius capitus/suboccitals, Rt infraspantus, Rt supraspinatus, Rt subscap,Rt upper trap and combined with Estim micro current with 100 frequency to desired intensity Treatment response/outcome: twitch response noted  DN performed by Elsie Ra, PT,DPT     PATIENT EDUCATION:  Education details: HEP,PT plan of care, DN Person educated: Patient Education method: Explanation, Demonstration, Verbal cues, and Handouts Education comprehension: verbalized understanding, returned  demonstration, and needs further education     HOME EXERCISE PROGRAM: Access Code: [email or web address] URL: [email or web address] Date: 06/26/2022 Prepared by: Vista Mink  Exercises - Standing Lumbar Extension  - 5 x daily - 6 x weekly - 1 sets - 10 reps - 5 hold - Seated Cervical Sidebending Stretch  - 2 x daily - 6 x weekly - 1 sets - 2-3 reps - 30 sec hold - Seated Isometric Cervical Extension  - 2 x daily - 6 x weekly - 1 sets - 10 reps - 5 hold - Standing Row with Anchored Resistance  - 1 x daily - 6 x weekly - 2-3 sets - 10-20 reps - Shoulder extension with resistance - Neutral  - 1 x daily - 6 x weekly -  2-3 sets - 10 reps - Shoulder External Rotation and Scapular Retraction with Resistance  - 1 x daily - 6 x weekly - 3 sets - 10 reps - Standing Shoulder Horizontal Abduction with Resistance  - 1 x daily - 6 x weekly - 3 sets - 10 reps - Deadlift with Resistance  - 1 x daily - 6 x weekly - 2 sets - 10 reps - Standing Scapular Retraction  - 5 x daily - 7 x weekly - 1 sets - 5 reps - 5 second hold - Standing Lumbar Extension at Two Strike  - 5 x daily - 7 x weekly - 1 sets - 5 reps - 3 seconds hold     ASSESSMENT:   CLINICAL IMPRESSION: Michael Garcia notes early progress with his postural awareness and pain since starting PT.  He feels dry needling has helped and he still needs feedback with body mechanics.  This will be important for long-term success as he is very active and is in the middle of a bathroom remodel.  Continue appropriate strength progressions and practical mechanics work to meet LTGs.  DN as appropriate.   OBJECTIVE IMPAIRMENTS: decreased activity tolerance, difficulty walking, decreased balance, decreased endurance, decreased mobility, decreased ROM, decreased strength, impaired flexibility, impaired UE/LE use, postural dysfunction, and pain.   ACTIVITY LIMITATIONS: bending, lifting, carry, locomotion, cleaning, community activity, driving, and or occupation    PERSONAL FACTORS: PMH: MDD,, cervical stenosis, HA, chronic myofascial pain, GAD, GERD, migraines, cervical spinal fusion;prior T11-L1 laminoplasty and resection of intradural tumor.  are also affecting patient's functional outcome.   REHAB POTENTIAL: Good   CLINICAL DECISION MAKING: Stable/uncomplicated   EVALUATION COMPLEXITY: Low       GOALS: Short term PT Goals Target date: 07/12/2022 Pt will be I and compliant with HEP. Baseline:  Goal status: Met 07/11/2022 Pt will decrease pain by 25% overall Baseline: Goal status: Met 07/11/2022   Long term PT goals Target date: 08/09/2022 Pt will improve neck and back ROM to [facility] to improve functional mobility Baseline: Goal status: New Pt will improve FOTO to at least 55% functional to show improved function Baseline: Goal status: New Pt will reduce pain by overall 50% overall with usual activity Baseline: Goal status: New     PLAN: PT FREQUENCY: 1-3 times per week    PT DURATION: 6-8 weeks   PLANNED INTERVENTIONS (unless contraindicated): aquatic PT, Canalith repositioning, cryotherapy, Electrical stimulation, Iontophoresis with 4 mg/ml dexamethasome, Moist heat, traction, Ultrasound, gait training, Therapeutic exercise, balance training, neuromuscular re-education, patient/family education, prosthetic training, manual techniques, passive ROM, dry needling, taping, vasopnuematic device, vestibular, spinal manipulations, joint manipulations   PLAN FOR NEXT SESSION: DN with estim, needs postural strength progressions as appropriate along with practical posture and body mechanics work, thoracic mobility.  FOTO and some objective measures to assess LTG progress.      Farley Ly, PT, DPT 07/11/2022, 5:10 PM

## 2022-07-13 ENCOUNTER — Ambulatory Visit (INDEPENDENT_AMBULATORY_CARE_PROVIDER_SITE_OTHER): Payer: BC Managed Care – PPO | Admitting: Rehabilitative and Restorative Service Providers"

## 2022-07-13 ENCOUNTER — Encounter: Payer: Self-pay | Admitting: Rehabilitative and Restorative Service Providers"

## 2022-07-13 DIAGNOSIS — M6281 Muscle weakness (generalized): Secondary | ICD-10-CM

## 2022-07-13 DIAGNOSIS — M5459 Other low back pain: Secondary | ICD-10-CM | POA: Diagnosis not present

## 2022-07-13 DIAGNOSIS — R293 Abnormal posture: Secondary | ICD-10-CM | POA: Diagnosis not present

## 2022-07-13 DIAGNOSIS — M542 Cervicalgia: Secondary | ICD-10-CM | POA: Diagnosis not present

## 2022-07-13 NOTE — Therapy (Signed)
OUTPATIENT PHYSICAL THERAPY TREATMENT NOTE   Patient Name: Michael Garcia. MRN: [id number] DOB:1955-12-16, 66 y.o., male Today's Date: 07/13/2022  PCP: Leamon Arnt, MD REFERRING PROVIDER: Lorine Bears, NP  END OF SESSION:   PT End of Session - 07/13/22 1350     Visit Number 5    Number of Visits 12    Date for PT Re-Evaluation 08/09/22    Authorization Type BC[employer]    PT Start Time 1345    PT Stop Time 1428    PT Time Calculation (min) 43 min    Activity Tolerance Patient tolerated treatment well;No increased pain    Behavior During Therapy [facility] for tasks assessed/performed             Past Medical History:  Diagnosis Date   Anxiety    Benign non-nodular prostatic hyperplasia with lower urinary tract symptoms 11/21/2017   Bipolar 1 disorder, depressed ([place]) 07/25/2020   Cervical spondylosis without myelopathy 04/29/2014   Cervical stenosis of spinal canal 11/12/2008   Chronic headaches    Chronic myofascial pain 11/21/2017   Colon polyps 07/25/2020   Colonoscopy 04/2020, [place], benign, recall in 5 years   Degenerative disc disease, cervical 04/29/2014   Erectile disorder, acquired, generalized, severe 11/21/2017   GAD (generalized anxiety disorder) 03/29/2016   Gastro-esophageal reflux disease with esophagitis 11/21/2017   Glaucoma 11/21/2017   Incomplete right bundle branch block 10/18/2016   Insomnia, persistent 8/89/1694   Lichen planus, penis 11/21/2017   Nondependent alcohol abuse, in remission 03/29/2016   Nonintractable migraine, unspecified migraine type 11/21/2017   Peyronie disease 11/12/2016   Status post cervical spinal fusion 04/29/2014   Thyroid nodule 11/21/2017   benign by FNA 04/2020   Past Surgical History:  Procedure Laterality Date   CERVICAL SPINE SURGERY  2010   c6-c7 fusion   HERNIA REPAIR     Patient Active Problem List   Diagnosis Date Noted   Mixed hyperlipidemia 01/15/2022   Prediabetes 01/15/2022   Bipolar 2 disorder ([place])  07/25/2020   Colon polyps 07/25/2020   Stricture of anterior urethra in male 09/11/2018   Schwannoma of spinal cord ([place]) 04/22/2018   Benign non-nodular prostatic hyperplasia with lower urinary tract symptoms 11/21/2017   Erectile dysfunction due to arterial insufficiency 11/21/2017   Gastritis 50/38/8828   Lichen planus 00/34/9179   Migraine headache 11/21/2017   Nontoxic thyroid nodule 11/21/2017   Glaucoma 11/21/2017   Chronic myofascial pain 11/21/2017   Peyronie's disease 11/12/2016   Incomplete right bundle branch block 10/18/2016   GAD (generalized anxiety disorder) 03/29/2016   Insomnia, persistent 03/29/2016   Nondependent alcohol abuse, in remission 03/29/2016   Cervical spondylosis without myelopathy 04/29/2014   Status post cervical spinal fusion 04/29/2014   Cervical stenosis of spinal canal 11/12/2008    REFERRING DIAG: M54.50,G89.29 (ICD-10-CM) - Chronic bilateral low back pain without sciatica M47.816 (ICD-10-CM) - Spondylosis without myelopathy or radiculopathy, lumbar region M47.816 (ICD-10-CM) - Facet hypertrophy of lumbar region M54.2 (ICD-10-CM) - Cervicalgia M54.6,G89.29 (ICD-10-CM) - Chronic midline thoracic back pain M79.18 (ICD-10-CM) - Myofascial pain syndrome M96.1 (ICD-10-CM) - Post laminectomy syndrome  THERAPY DIAG:  Other low back pain  Cervicalgia  Muscle weakness (generalized)  Abnormal posture  Rationale for Evaluation and Treatment Rehabilitation  PERTINENT HISTORY: MDD, cervical stenosis, HA, chronic myofascial pain, GAD, GERD, migraines, cervical spinal fusion; prior T11-L1 laminoplasty and resection of intradural tumor  PRECAUTIONS: Fusion C6-7  SUBJECTIVE: Michael Garcia reports back pain between 0-4/10 over the past 2 days,  even with his bathroom renovation.    PAIN:  Are you having pain? Yes: NPRS scale: 0-4/10 Pain location: Neck and low back Pain description: Ache, sore Aggravating factors: Flexion and prolonged  postures Relieving factors: Change of position, ice, topical medication   OBJECTIVE: (objective measures completed at initial evaluation unless otherwise dated)  OBJECTIVE:    DIAGNOSTIC FINDINGS:  MRI from 2020 "exhibits disc bulge and facet arthropathy at L4-L5 contributing to mild right and moderate left neural foraminal narrowing, unchanged postoperative appearance from prior T11-L1 laminoplasty and resection of intradural tumor. No evidence of local recurrence. No high grade spinal canal stenosis noted."   PATIENT SURVEYS:  FOTO 53% functional intake, predicted goal is 55%   SCREENING FOR RED FLAGS: Bowel or bladder incontinence: No Spinal tumors: No Cauda equina syndrome: No Compression fracture: No Abdominal aneurysm: No   COGNITION:           Overall cognitive status: Within functional limits for tasks assessed                          SENSATION: WFL   MUSCLE LENGTH:   POSTURE: slumped posture, forward head, thoracic kyphosis   PALPATION: TTP with trigger points in Rt side cerval-thoracic paraspinals, scapular area, and upper trap.   Neck ROM At Eval Flexion 30 Extension 10 Lateral flexion Rt 20 deg Lateral flexion left 20 deg Rotation to Rt 60 deg Rotation to left 55 deg   LUMBAR ROM:    Active  AROM  eval  Flexion 80%  Extension 50%  Right lateral flexion WFL  Left lateral flexion WFL  Right rotation WFL  Left rotation WFL   (Blank rows = not tested)   LOWER EXTREMITY ROM:      Active  Right eval Left eval  Hip flexion      Hip extension      Hip abduction      Hip adduction      Hip internal rotation      Hip external rotation      Knee flexion      Knee extension      Ankle dorsiflexion      Ankle plantarflexion      Ankle inversion      Ankle eversion       (Blank rows = not tested)   LOWER EXTREMITY MMT:       MMT Right eval Left eval  Hip flexion 4 4  Hip extension      Hip abduction 4 4  Hip adduction      Hip  internal rotation      Hip external rotation      Knee flexion 5 5  Knee extension 4+ 4+  Ankle dorsiflexion      Ankle plantarflexion      Ankle inversion      Ankle eversion       (Blank rows = not tested)   UE strength grossly WNL except bilat ER 4/5 at eval   LUMBAR SPECIAL TESTS:      FUNCTIONAL TESTS:      GAIT: Comments: WFL       TODAY'S TREATMENT  07/13/2022 Lumbar extension (hips forward) 10X 3 seconds Shoulder blade pinches 10X 5 seconds Rows Blue theraband 20X 3 seconds Shoulder extension (palms up) 10X 3 seconds Red theraband Shoulder ER with Resistance Green 10X each side(palms up) 3 seconds Cervical extension isometrics 10X 5 seconds Prone alternating hip extensions 10X  3 seconds Prone alternate arm and leg extensions 10X 3 seconds Hip hike in doorway 2 sets of 5 for 3 seconds   Reviewed body mechanics with his bathroom renovations   07/11/2022 Therapeutic Exercises: Lumbar extension (hips forward) 10X 3 seconds Shoulder blade pinches 10X 5 seconds Rows Blue theraband 20X 3 seconds Shoulder extension (palms up) 10X 3 seconds Red theraband Shoulder ER with Resistance Green 10X each side(palms up) 3 seconds Cervical extension isometrics 10X 5 seconds     Hip hiking 2 sets of 5 for 3 seconds Prone alternating hip extension 10X 3 seconds Prone alternating arm and leg extensions 10X 3 seconds   Discussed disc pressures in various positions and practiced practical golfer's and diagonal squat lift for use in renovating his bathroom   Trigger Point Dry-Needling  Treatment instructions: Expect mild to moderate muscle soreness. S/S of pneumothorax if dry needled over a lung field, and to seek immediate medical attention should they occur. Patient verbalized understanding of these instructions and education.   Patient Consent Given: Yes  Education handout provided: Previously provided  Muscles treated: Rt upper trap, levator scapula, rhomboids  Electrical  stimulation performed: No  Parameters: N/A  Treatment response/outcome: twitch responses noted in all muscle groups   Manual:   STM with compression to Rt upper trap, levator scapula and rhomboids.  Skilled palpation and monitoring of soft tissue during DN.   Dry needling and manual therapy performed by Faustino Congress, PT, DPT.    07/05/2022 Therapeutic Exercises: UBE L5 X 6 min retro    Shoulder extensions green X 20 Rows green 20X 3 seconds Shoulder ER bilat with green X20 Standing Shoulder Horizontal Abduction Green 20X Supine chin tucks 5 sec X 15 Supine neck extension 5 sec X 15 Supine thoracic extension on foam roll Supine thoracic rotation X 5   Manual therapy: Skiled palpation and active compression with Trigger Point Dry-Needling  Treatment instructions: Expect mild to moderate muscle soreness.  Patient Consent Given: Yes Muscles treated: Cervical-thoacic mutlifidi/paraspinals, splenius capitus/suboccitals, Rt infraspantus, Rt supraspinatus, Rt subscap,Rt upper trap and combined with Estim micro current with 100 frequency to desired intensity Treatment response/outcome: twitch response noted  DN performed by Elsie Ra, PT,DPT    PATIENT EDUCATION:  Education details: HEP,PT plan of care, DN Person educated: Patient Education method: Explanation, Demonstration, Verbal cues, and Handouts Education comprehension: verbalized understanding, returned demonstration, and needs further education     HOME EXERCISE PROGRAM: Access Code: [email or web address] URL: [email or web address] Date: 07/13/2022 Prepared by: Vista Mink  Exercises - Standing Lumbar Extension  - 5 x daily - 6 x weekly - 1 sets - 10 reps - 5 hold - Seated Cervical Sidebending Stretch  - 2 x daily - 6 x weekly - 1 sets - 2-3 reps - 30 sec hold - Seated Isometric Cervical Extension  - 2 x daily - 6 x weekly - 1 sets - 10 reps - 5 hold - Standing Row with Anchored Resistance  - 1 x daily - 6 x  weekly - 2-3 sets - 10-20 reps - Shoulder extension with resistance - Neutral  - 1 x daily - 6 x weekly - 2-3 sets - 10 reps - Shoulder External Rotation and Scapular Retraction with Resistance  - 1 x daily - 6 x weekly - 3 sets - 10 reps - Standing Shoulder Horizontal Abduction with Resistance  - 1 x daily - 6 x weekly - 3 sets - 10 reps - Deadlift with Resistance  - 1 x  daily - 6 x weekly - 2 sets - 10 reps - Standing Scapular Retraction  - 5 x daily - 7 x weekly - 1 sets - 5 reps - 5 second hold - Standing Lumbar Extension at Wall - Forearms  - 5 x daily - 7 x weekly - 1 sets - 5 reps - 3 seconds hold - Standing Hip Hiking  - 1 x daily - 7 x weekly - 2 sets - 5 reps - 3 seconds hold - Prone Hip Extension  - 1 x daily - 7 x weekly - 1-2 sets - 10 reps - 3 seconds hold - Prone Alternating Arm and Leg Lifts  - 1 x daily - 7 x weekly - 1-2 sets - 10 reps - 3-10 seconds hold   ASSESSMENT:   CLINICAL IMPRESSION: Michael Garcia notes doing well with his bathroom renovations due to improved postural awareness and strength since starting PT.  Appropriate strength progressions and practical mechanics work twill continue to help [place] meet LTGs.  DN as appropriate may also help with symptoms.   OBJECTIVE IMPAIRMENTS: decreased activity tolerance, difficulty walking, decreased balance, decreased endurance, decreased mobility, decreased ROM, decreased strength, impaired flexibility, impaired UE/LE use, postural dysfunction, and pain.   ACTIVITY LIMITATIONS: bending, lifting, carry, locomotion, cleaning, community activity, driving, and or occupation   PERSONAL FACTORS: PMH: MDD,, cervical stenosis, HA, chronic myofascial pain, GAD, GERD, migraines, cervical spinal fusion;prior T11-L1 laminoplasty and resection of intradural tumor.  are also affecting patient's functional outcome.   REHAB POTENTIAL: Good   CLINICAL DECISION MAKING: Stable/uncomplicated   EVALUATION COMPLEXITY: Low       GOALS: Short term  PT Goals Target date: 07/12/2022 Pt will be I and compliant with HEP. Baseline:  Goal status: Met 07/11/2022 Pt will decrease pain by 25% overall Baseline: Goal status: Met 07/11/2022   Long term PT goals Target date: 08/09/2022 Pt will improve neck and back ROM to [facility] to improve functional mobility Baseline: Goal status: New Pt will improve FOTO to at least 55% functional to show improved function Baseline: Goal status: New Pt will reduce pain by overall 50% overall with usual activity Baseline: Goal status: New     PLAN: PT FREQUENCY: 1-3 times per week    PT DURATION: 6-8 weeks   PLANNED INTERVENTIONS (unless contraindicated): aquatic PT, Canalith repositioning, cryotherapy, Electrical stimulation, Iontophoresis with 4 mg/ml dexamethasome, Moist heat, traction, Ultrasound, gait training, Therapeutic exercise, balance training, neuromuscular re-education, patient/family education, prosthetic training, manual techniques, passive ROM, dry needling, taping, vasopnuematic device, vestibular, spinal manipulations, joint manipulations   PLAN FOR NEXT SESSION: DN with estim, needs postural strength progressions as appropriate along with practical posture and body mechanics work, thoracic mobility.  FOTO and some objective measures to assess LTG progress.    Farley Ly, PT, MPT 07/13/2022, 4:38 PM

## 2022-07-17 ENCOUNTER — Ambulatory Visit (INDEPENDENT_AMBULATORY_CARE_PROVIDER_SITE_OTHER): Payer: BC Managed Care – PPO | Admitting: Physical Therapy

## 2022-07-17 ENCOUNTER — Encounter: Payer: Self-pay | Admitting: Physical Medicine and Rehabilitation

## 2022-07-17 ENCOUNTER — Encounter: Payer: Self-pay | Admitting: Physical Therapy

## 2022-07-17 DIAGNOSIS — R293 Abnormal posture: Secondary | ICD-10-CM | POA: Diagnosis not present

## 2022-07-17 DIAGNOSIS — M6281 Muscle weakness (generalized): Secondary | ICD-10-CM

## 2022-07-17 DIAGNOSIS — M5459 Other low back pain: Secondary | ICD-10-CM

## 2022-07-17 DIAGNOSIS — M542 Cervicalgia: Secondary | ICD-10-CM | POA: Diagnosis not present

## 2022-07-17 NOTE — Therapy (Addendum)
OUTPATIENT PHYSICAL THERAPY TREATMENT NOTE PHYSICAL THERAPY DISCHARGE SUMMARY  Visits from Start of Care: 6  Current functional level related to goals / functional outcomes: See below   Remaining deficits: See below   Education / Equipment: HEP  Plan:  Patient goals were some met. Patient is being discharged due to not returning since last visit. Elsie Ra, PT, DPT 09/11/22 12:39 PM       Patient Name: Kyian Obst. MRN: [id number] DOB:[birth year], 66 y.o., male Today's Date: 07/17/2022  PCP: Leamon Arnt, MD REFERRING PROVIDER: Lorine Bears, NP  END OF SESSION:   PT End of Session - 07/17/22 1510     Visit Number 6    Number of Visits 12    Date for PT Re-Evaluation 08/09/22    Authorization Type [employer]    PT Start Time 1512    PT Stop Time 1552    PT Time Calculation (min) 40 min    Activity Tolerance Patient tolerated treatment well;No increased pain    Behavior During Therapy [facility] for tasks assessed/performed             Past Medical History:  Diagnosis Date   Anxiety    Benign non-nodular prostatic hyperplasia with lower urinary tract symptoms 11/21/2017   Bipolar 1 disorder, depressed ([place]) 07/25/2020   Cervical spondylosis without myelopathy 04/29/2014   Cervical stenosis of spinal canal 11/12/2008   Chronic headaches    Chronic myofascial pain 11/21/2017   Colon polyps 07/25/2020   Colonoscopy 04/2020, [place], benign, recall in 5 years   Degenerative disc disease, cervical 04/29/2014   Erectile disorder, acquired, generalized, severe 11/21/2017   GAD (generalized anxiety disorder) 03/29/2016   Gastro-esophageal reflux disease with esophagitis 11/21/2017   Glaucoma 11/21/2017   Incomplete right bundle branch block 10/18/2016   Insomnia, persistent 1/66/0630   Lichen planus, penis 11/21/2017   Nondependent alcohol abuse, in remission 03/29/2016   Nonintractable migraine, unspecified migraine type 11/21/2017   Peyronie disease 11/12/2016    Status post cervical spinal fusion 04/29/2014   Thyroid nodule 11/21/2017   benign by FNA 04/2020   Past Surgical History:  Procedure Laterality Date   CERVICAL SPINE SURGERY  2010   c6-c7 fusion   HERNIA REPAIR     Patient Active Problem List   Diagnosis Date Noted   Mixed hyperlipidemia 01/15/2022   Prediabetes 01/15/2022   Bipolar 2 disorder ([place]) 07/25/2020   Colon polyps 07/25/2020   Stricture of anterior urethra in male 09/11/2018   Schwannoma of spinal cord ([place]) 04/22/2018   Benign non-nodular prostatic hyperplasia with lower urinary tract symptoms 11/21/2017   Erectile dysfunction due to arterial insufficiency 11/21/2017   Gastritis 16/11/930   Lichen planus 35/57/3220   Migraine headache 11/21/2017   Nontoxic thyroid nodule 11/21/2017   Glaucoma 11/21/2017   Chronic myofascial pain 11/21/2017   Peyronie's disease 11/12/2016   Incomplete right bundle branch block 10/18/2016   GAD (generalized anxiety disorder) 03/29/2016   Insomnia, persistent 03/29/2016   Nondependent alcohol abuse, in remission 03/29/2016   Cervical spondylosis without myelopathy 04/29/2014   Status post cervical spinal fusion 04/29/2014   Cervical stenosis of spinal canal 11/12/2008    REFERRING DIAG: M54.50,G89.29 (ICD-10-CM) - Chronic bilateral low back pain without sciatica M47.816 (ICD-10-CM) - Spondylosis without myelopathy or radiculopathy, lumbar region M47.816 (ICD-10-CM) - Facet hypertrophy of lumbar region M54.2 (ICD-10-CM) - Cervicalgia M54.6,G89.29 (ICD-10-CM) - Chronic midline thoracic back pain M79.18 (ICD-10-CM) - Myofascial pain syndrome M96.1 (ICD-10-CM) - Post laminectomy syndrome  THERAPY DIAG:  Other low back pain  Cervicalgia  Muscle weakness (generalized)  Abnormal posture  Rationale for Evaluation and Treatment Rehabilitation  PERTINENT HISTORY: MDD, cervical stenosis, HA, chronic myofascial pain, GAD, GERD, migraines, cervical spinal fusion; prior T11-L1  laminoplasty and resection of intradural tumor  PRECAUTIONS: Fusion C6-7  SUBJECTIVE: Hunter reports back pain today after he strained it doing home renovations. PAIN:  Are you having pain? Yes: NPRS scale: 4 today but was an 8 yesterday d/10 Pain location: Neck and low back Pain description: Ache, sore Aggravating factors: Flexion and prolonged postures Relieving factors: Change of position, ice, topical medication   OBJECTIVE: (objective measures completed at initial evaluation unless otherwise dated)  OBJECTIVE:    DIAGNOSTIC FINDINGS:  MRI from 2020 "exhibits disc bulge and facet arthropathy at L4-L5 contributing to mild right and moderate left neural foraminal narrowing, unchanged postoperative appearance from prior T11-L1 laminoplasty and resection of intradural tumor. No evidence of local recurrence. No high grade spinal canal stenosis noted."   PATIENT SURVEYS:  FOTO 53% functional intake, predicted goal is 55%   SCREENING FOR RED FLAGS: Bowel or bladder incontinence: No Spinal tumors: No Cauda equina syndrome: No Compression fracture: No Abdominal aneurysm: No   COGNITION:           Overall cognitive status: Within functional limits for tasks assessed                          SENSATION: WFL   MUSCLE LENGTH:   POSTURE: slumped posture, forward head, thoracic kyphosis   PALPATION: TTP with trigger points in Rt side cerval-thoracic paraspinals, scapular area, and upper trap.   Neck ROM At Eval Flexion 30 Extension 10 Lateral flexion Rt 20 deg Lateral flexion left 20 deg Rotation to Rt 60 deg Rotation to left 55 deg   LUMBAR ROM:    Active  AROM  eval AROM 07/17/22  Flexion 80%   Extension 50% 75%  Right lateral flexion WFL   Left lateral flexion WFL   Right rotation WFL   Left rotation WFL    (Blank rows = not tested)   LOWER EXTREMITY ROM:      Active  Right eval Left eval  Hip flexion      Hip extension      Hip abduction      Hip  adduction      Hip internal rotation      Hip external rotation      Knee flexion      Knee extension      Ankle dorsiflexion      Ankle plantarflexion      Ankle inversion      Ankle eversion       (Blank rows = not tested)   LOWER EXTREMITY MMT:       MMT Right eval Left eval  Hip flexion 4 4  Hip extension      Hip abduction 4 4  Hip adduction      Hip internal rotation      Hip external rotation      Knee flexion 5 5  Knee extension 4+ 4+  Ankle dorsiflexion      Ankle plantarflexion      Ankle inversion      Ankle eversion       (Blank rows = not tested)   UE strength grossly WNL except bilat ER 4/5 at eval   LUMBAR SPECIAL TESTS:  FUNCTIONAL TESTS:      GAIT: Comments: WFL       TODAY'S TREATMENT  07/17/22 Therapeutic Exercises: UBE L5 X 5 min switching 1/2 way Shoulder extensions blue X 20 Rows blue 20 X 3 seconds Shoulder ER bilat with green X20 Standing Shoulder Horizontal Abduction Green 20X Standing lumbar extensions X 10 holding 5 sec Standing shoulder flexion at doorway reaching up into tall posture maintaining back of head to doorframe and holding 2 breaths X 10 reps Standing lumbar L stretch with alternating shoulder raises X 10 each   Manual therapy: Skiled palpation and active compression with Trigger Point Dry-Needling  Treatment instructions: Expect mild to moderate muscle soreness.  Patient Consent Given: Yes Muscles treated: lumbar mutlifidi/paraspinals, QL bilat combined with Estim micro current with 100 frequency to desired intensity Treatment response/outcome: twitch response noted    07/13/2022 Lumbar extension (hips forward) 10X 3 seconds Shoulder blade pinches 10X 5 seconds Rows Blue theraband 20X 3 seconds Shoulder extension (palms up) 10X 3 seconds Red theraband Shoulder ER with Resistance Green 10X each side(palms up) 3 seconds Cervical extension isometrics 10X 5 seconds Prone alternating hip extensions 10X 3  seconds Prone alternate arm and leg extensions 10X 3 seconds Hip hike in doorway 2 sets of 5 for 3 seconds   Reviewed body mechanics with his bathroom renovations   07/11/2022 Therapeutic Exercises: Lumbar extension (hips forward) 10X 3 seconds Shoulder blade pinches 10X 5 seconds Rows Blue theraband 20X 3 seconds Shoulder extension (palms up) 10X 3 seconds Red theraband Shoulder ER with Resistance Green 10X each side(palms up) 3 seconds Cervical extension isometrics 10X 5 seconds     Hip hiking 2 sets of 5 for 3 seconds Prone alternating hip extension 10X 3 seconds Prone alternating arm and leg extensions 10X 3 seconds   Discussed disc pressures in various positions and practiced practical golfer's and diagonal squat lift for use in renovating his bathroom   Trigger Point Dry-Needling  Treatment instructions: Expect mild to moderate muscle soreness. S/S of pneumothorax if dry needled over a lung field, and to seek immediate medical attention should they occur. Patient verbalized understanding of these instructions and education.   Patient Consent Given: Yes  Education handout provided: Previously provided  Muscles treated: Rt upper trap, levator scapula, rhomboids  Electrical stimulation performed: No  Parameters: N/A  Treatment response/outcome: twitch responses noted in all muscle groups   Manual:   STM with compression to Rt upper trap, levator scapula and rhomboids.  Skilled palpation and monitoring of soft tissue during DN.   Dry needling and manual therapy performed by Faustino Congress, PT, DPT.    07/05/2022 Therapeutic Exercises: UBE L5 X 6 min retro    Shoulder extensions green X 20 Rows green 20X 3 seconds Shoulder ER bilat with green X20 Standing Shoulder Horizontal Abduction Green 20X Supine chin tucks 5 sec X 15 Supine neck extension 5 sec X 15 Supine thoracic extension on foam roll Supine thoracic rotation X 5   Manual therapy: Skiled palpation and  active compression with Trigger Point Dry-Needling  Treatment instructions: Expect mild to moderate muscle soreness.  Patient Consent Given: Yes Muscles treated: Cervical-thoacic mutlifidi/paraspinals, splenius capitus/suboccitals, Rt infraspantus, Rt supraspinatus, Rt subscap,Rt upper trap and combined with Estim micro current with 100 frequency to desired intensity Treatment response/outcome: twitch response noted  DN performed by Elsie Ra, PT,DPT    PATIENT EDUCATION:  Education details: HEP,PT plan of care, DN Person educated: Patient Education method: Explanation, Demonstration, Verbal cues,  and Handouts Education comprehension: verbalized understanding, returned demonstration, and needs further education     HOME EXERCISE PROGRAM: Access Code: [email or web address] URL: [email or web address] Date: 07/13/2022 Prepared by: Vista Mink  Exercises - Standing Lumbar Extension  - 5 x daily - 6 x weekly - 1 sets - 10 reps - 5 hold - Seated Cervical Sidebending Stretch  - 2 x daily - 6 x weekly - 1 sets - 2-3 reps - 30 sec hold - Seated Isometric Cervical Extension  - 2 x daily - 6 x weekly - 1 sets - 10 reps - 5 hold - Standing Row with Anchored Resistance  - 1 x daily - 6 x weekly - 2-3 sets - 10-20 reps - Shoulder extension with resistance - Neutral  - 1 x daily - 6 x weekly - 2-3 sets - 10 reps - Shoulder External Rotation and Scapular Retraction with Resistance  - 1 x daily - 6 x weekly - 3 sets - 10 reps - Standing Shoulder Horizontal Abduction with Resistance  - 1 x daily - 6 x weekly - 3 sets - 10 reps - Deadlift with Resistance  - 1 x daily - 6 x weekly - 2 sets - 10 reps - Standing Scapular Retraction  - 5 x daily - 7 x weekly - 1 sets - 5 reps - 5 second hold - Standing Lumbar Extension at Wall - Forearms  - 5 x daily - 7 x weekly - 1 sets - 5 reps - 3 seconds hold - Standing Hip Hiking  - 1 x daily - 7 x weekly - 2 sets - 5 reps - 3 seconds hold - Prone Hip Extension   - 1 x daily - 7 x weekly - 1-2 sets - 10 reps - 3 seconds hold - Prone Alternating Arm and Leg Lifts  - 1 x daily - 7 x weekly - 1-2 sets - 10 reps - 3-10 seconds hold   ASSESSMENT:   CLINICAL IMPRESSION: He had low back flare up after performing home renovations. I did DN this area to see if this helps with his pain and showed him a few extra stretches to see if they help as well. PT recommending PT for another 3-4 weeks at one time per week.    OBJECTIVE IMPAIRMENTS: decreased activity tolerance, difficulty walking, decreased balance, decreased endurance, decreased mobility, decreased ROM, decreased strength, impaired flexibility, impaired UE/LE use, postural dysfunction, and pain.   ACTIVITY LIMITATIONS: bending, lifting, carry, locomotion, cleaning, community activity, driving, and or occupation   PERSONAL FACTORS: PMH: MDD,, cervical stenosis, HA, chronic myofascial pain, GAD, GERD, migraines, cervical spinal fusion;prior T11-L1 laminoplasty and resection of intradural tumor.  are also affecting patient's functional outcome.   REHAB POTENTIAL: Good   CLINICAL DECISION MAKING: Stable/uncomplicated   EVALUATION COMPLEXITY: Low       GOALS: Short term PT Goals Target date: 07/12/2022 Pt will be I and compliant with HEP. Baseline:  Goal status: Met 07/11/2022 Pt will decrease pain by 25% overall Baseline: Goal status: Met 07/11/2022   Long term PT goals Target date: 08/09/2022 Pt will improve neck and back ROM to [facility] to improve functional mobility Baseline: Goal status: ongoing Pt will improve FOTO to at least 55% functional to show improved function Baseline: Goal status: ongoing Pt will reduce pain by overall 50% overall with usual activity Baseline: Goal status: ongoing     PLAN: PT FREQUENCY: 1-3 times per week    PT DURATION: 6-8 weeks   PLANNED INTERVENTIONS (  unless contraindicated): aquatic PT, Canalith repositioning, cryotherapy, Electrical stimulation,  Iontophoresis with 4 mg/ml dexamethasome, Moist heat, traction, Ultrasound, gait training, Therapeutic exercise, balance training, neuromuscular re-education, patient/family education, prosthetic training, manual techniques, passive ROM, dry needling, taping, vasopnuematic device, vestibular, spinal manipulations, joint manipulations   PLAN FOR NEXT SESSION: DN with estim, needs postural strength progressions as appropriate along with practical posture and body mechanics work, thoracic mobility. capture FOTO.  Debbe Odea, PT, DPT 07/17/2022, 3:11 PM

## 2022-07-19 DIAGNOSIS — F411 Generalized anxiety disorder: Secondary | ICD-10-CM | POA: Diagnosis not present

## 2022-07-19 DIAGNOSIS — F3132 Bipolar disorder, current episode depressed, moderate: Secondary | ICD-10-CM | POA: Diagnosis not present

## 2022-08-03 ENCOUNTER — Encounter: Payer: Self-pay | Admitting: Adult Health

## 2022-08-06 ENCOUNTER — Encounter: Payer: Self-pay | Admitting: *Deleted

## 2022-08-07 ENCOUNTER — Encounter: Payer: BC Managed Care – PPO | Admitting: Physical Therapy

## 2022-08-07 MED ORDER — GABAPENTIN 100 MG PO CAPS: 200.0000 mg | ORAL_CAPSULE | Freq: Two times a day (BID) | ORAL | 3 refills | Status: DC

## 2022-08-07 NOTE — Telephone Encounter (Signed)
Prescription sent

## 2022-08-13 ENCOUNTER — Ambulatory Visit (INDEPENDENT_AMBULATORY_CARE_PROVIDER_SITE_OTHER): Payer: BC Managed Care – PPO | Admitting: *Deleted

## 2022-08-13 DIAGNOSIS — Z23 Encounter for immunization: Secondary | ICD-10-CM

## 2022-08-20 ENCOUNTER — Encounter: Payer: Self-pay | Admitting: Family Medicine

## 2022-08-28 ENCOUNTER — Encounter: Payer: BC Managed Care – PPO | Admitting: Physical Therapy

## 2022-09-04 DIAGNOSIS — M25552 Pain in left hip: Secondary | ICD-10-CM | POA: Diagnosis not present

## 2022-09-04 DIAGNOSIS — M47892 Other spondylosis, cervical region: Secondary | ICD-10-CM | POA: Diagnosis not present

## 2022-09-04 DIAGNOSIS — M9905 Segmental and somatic dysfunction of pelvic region: Secondary | ICD-10-CM | POA: Diagnosis not present

## 2022-09-04 DIAGNOSIS — M546 Pain in thoracic spine: Secondary | ICD-10-CM | POA: Diagnosis not present

## 2022-09-04 DIAGNOSIS — M9903 Segmental and somatic dysfunction of lumbar region: Secondary | ICD-10-CM | POA: Diagnosis not present

## 2022-09-10 DIAGNOSIS — M9903 Segmental and somatic dysfunction of lumbar region: Secondary | ICD-10-CM | POA: Diagnosis not present

## 2022-09-10 DIAGNOSIS — M9901 Segmental and somatic dysfunction of cervical region: Secondary | ICD-10-CM | POA: Diagnosis not present

## 2022-09-10 DIAGNOSIS — M546 Pain in thoracic spine: Secondary | ICD-10-CM | POA: Diagnosis not present

## 2022-09-11 ENCOUNTER — Encounter: Payer: BC Managed Care – PPO | Admitting: Physical Therapy

## 2022-09-12 DIAGNOSIS — M546 Pain in thoracic spine: Secondary | ICD-10-CM | POA: Diagnosis not present

## 2022-09-12 DIAGNOSIS — M9901 Segmental and somatic dysfunction of cervical region: Secondary | ICD-10-CM | POA: Diagnosis not present

## 2022-09-12 DIAGNOSIS — M9903 Segmental and somatic dysfunction of lumbar region: Secondary | ICD-10-CM | POA: Diagnosis not present

## 2022-09-18 DIAGNOSIS — M9903 Segmental and somatic dysfunction of lumbar region: Secondary | ICD-10-CM | POA: Diagnosis not present

## 2022-09-18 DIAGNOSIS — M546 Pain in thoracic spine: Secondary | ICD-10-CM | POA: Diagnosis not present

## 2022-09-18 DIAGNOSIS — M9901 Segmental and somatic dysfunction of cervical region: Secondary | ICD-10-CM | POA: Diagnosis not present

## 2022-09-20 DIAGNOSIS — M9903 Segmental and somatic dysfunction of lumbar region: Secondary | ICD-10-CM | POA: Diagnosis not present

## 2022-09-20 DIAGNOSIS — M9901 Segmental and somatic dysfunction of cervical region: Secondary | ICD-10-CM | POA: Diagnosis not present

## 2022-09-20 DIAGNOSIS — M546 Pain in thoracic spine: Secondary | ICD-10-CM | POA: Diagnosis not present

## 2022-09-21 DIAGNOSIS — M9901 Segmental and somatic dysfunction of cervical region: Secondary | ICD-10-CM | POA: Diagnosis not present

## 2022-09-21 DIAGNOSIS — M546 Pain in thoracic spine: Secondary | ICD-10-CM | POA: Diagnosis not present

## 2022-09-21 DIAGNOSIS — M9903 Segmental and somatic dysfunction of lumbar region: Secondary | ICD-10-CM | POA: Diagnosis not present

## 2022-09-24 DIAGNOSIS — M9903 Segmental and somatic dysfunction of lumbar region: Secondary | ICD-10-CM | POA: Diagnosis not present

## 2022-09-24 DIAGNOSIS — M546 Pain in thoracic spine: Secondary | ICD-10-CM | POA: Diagnosis not present

## 2022-09-24 DIAGNOSIS — M9901 Segmental and somatic dysfunction of cervical region: Secondary | ICD-10-CM | POA: Diagnosis not present

## 2022-09-25 DIAGNOSIS — H25813 Combined forms of age-related cataract, bilateral: Secondary | ICD-10-CM | POA: Diagnosis not present

## 2022-09-25 DIAGNOSIS — H5021 Vertical strabismus, right eye: Secondary | ICD-10-CM | POA: Diagnosis not present

## 2022-09-27 DIAGNOSIS — M9903 Segmental and somatic dysfunction of lumbar region: Secondary | ICD-10-CM | POA: Diagnosis not present

## 2022-09-27 DIAGNOSIS — M546 Pain in thoracic spine: Secondary | ICD-10-CM | POA: Diagnosis not present

## 2022-09-27 DIAGNOSIS — M9901 Segmental and somatic dysfunction of cervical region: Secondary | ICD-10-CM | POA: Diagnosis not present

## 2022-09-28 DIAGNOSIS — M546 Pain in thoracic spine: Secondary | ICD-10-CM | POA: Diagnosis not present

## 2022-09-28 DIAGNOSIS — M9903 Segmental and somatic dysfunction of lumbar region: Secondary | ICD-10-CM | POA: Diagnosis not present

## 2022-09-28 DIAGNOSIS — M9901 Segmental and somatic dysfunction of cervical region: Secondary | ICD-10-CM | POA: Diagnosis not present

## 2022-10-01 DIAGNOSIS — N5201 Erectile dysfunction due to arterial insufficiency: Secondary | ICD-10-CM | POA: Diagnosis not present

## 2022-10-01 DIAGNOSIS — N35814 Other anterior urethral stricture, male: Secondary | ICD-10-CM | POA: Diagnosis not present

## 2022-10-01 DIAGNOSIS — N401 Enlarged prostate with lower urinary tract symptoms: Secondary | ICD-10-CM | POA: Diagnosis not present

## 2022-10-01 DIAGNOSIS — Z960 Presence of urogenital implants: Secondary | ICD-10-CM | POA: Diagnosis not present

## 2022-10-01 DIAGNOSIS — R310 Gross hematuria: Secondary | ICD-10-CM | POA: Diagnosis not present

## 2022-10-01 DIAGNOSIS — N489 Disorder of penis, unspecified: Secondary | ICD-10-CM | POA: Diagnosis not present

## 2022-10-01 DIAGNOSIS — N486 Induration penis plastica: Secondary | ICD-10-CM | POA: Diagnosis not present

## 2022-10-01 DIAGNOSIS — Z125 Encounter for screening for malignant neoplasm of prostate: Secondary | ICD-10-CM | POA: Diagnosis not present

## 2022-10-01 DIAGNOSIS — L439 Lichen planus, unspecified: Secondary | ICD-10-CM | POA: Diagnosis not present

## 2022-10-01 DIAGNOSIS — Z9079 Acquired absence of other genital organ(s): Secondary | ICD-10-CM | POA: Diagnosis not present

## 2022-10-01 DIAGNOSIS — N320 Bladder-neck obstruction: Secondary | ICD-10-CM | POA: Diagnosis not present

## 2022-10-02 ENCOUNTER — Other Ambulatory Visit: Payer: Self-pay | Admitting: Family Medicine

## 2022-10-02 ENCOUNTER — Encounter: Payer: Self-pay | Admitting: Family Medicine

## 2022-10-16 DIAGNOSIS — F3132 Bipolar disorder, current episode depressed, moderate: Secondary | ICD-10-CM | POA: Diagnosis not present

## 2022-10-16 DIAGNOSIS — M9901 Segmental and somatic dysfunction of cervical region: Secondary | ICD-10-CM | POA: Diagnosis not present

## 2022-10-16 DIAGNOSIS — M546 Pain in thoracic spine: Secondary | ICD-10-CM | POA: Diagnosis not present

## 2022-10-16 DIAGNOSIS — M9903 Segmental and somatic dysfunction of lumbar region: Secondary | ICD-10-CM | POA: Diagnosis not present

## 2022-10-16 DIAGNOSIS — F411 Generalized anxiety disorder: Secondary | ICD-10-CM | POA: Diagnosis not present

## 2022-10-18 DIAGNOSIS — M9901 Segmental and somatic dysfunction of cervical region: Secondary | ICD-10-CM | POA: Diagnosis not present

## 2022-10-18 DIAGNOSIS — M546 Pain in thoracic spine: Secondary | ICD-10-CM | POA: Diagnosis not present

## 2022-10-22 DIAGNOSIS — M546 Pain in thoracic spine: Secondary | ICD-10-CM | POA: Diagnosis not present

## 2022-10-22 DIAGNOSIS — M9901 Segmental and somatic dysfunction of cervical region: Secondary | ICD-10-CM | POA: Diagnosis not present

## 2022-10-25 DIAGNOSIS — M546 Pain in thoracic spine: Secondary | ICD-10-CM | POA: Diagnosis not present

## 2022-10-25 DIAGNOSIS — M9901 Segmental and somatic dysfunction of cervical region: Secondary | ICD-10-CM | POA: Diagnosis not present

## 2022-12-10 ENCOUNTER — Encounter: Payer: Self-pay | Admitting: Physical Medicine and Rehabilitation

## 2022-12-10 ENCOUNTER — Other Ambulatory Visit: Payer: Self-pay | Admitting: Physical Medicine and Rehabilitation

## 2022-12-10 DIAGNOSIS — F3132 Bipolar disorder, current episode depressed, moderate: Secondary | ICD-10-CM | POA: Diagnosis not present

## 2022-12-10 DIAGNOSIS — F411 Generalized anxiety disorder: Secondary | ICD-10-CM | POA: Diagnosis not present

## 2022-12-10 MED ORDER — PREDNISONE 50 MG PO TABS: 50.0000 mg | ORAL_TABLET | Freq: Every day | ORAL | 0 refills | Status: DC

## 2022-12-10 MED ORDER — TRAMADOL HCL 50 MG PO TABS: 50.0000 mg | ORAL_TABLET | Freq: Two times a day (BID) | ORAL | 0 refills | Status: DC | PRN

## 2022-12-20 ENCOUNTER — Encounter: Payer: Self-pay | Admitting: Adult Health

## 2022-12-25 DIAGNOSIS — F411 Generalized anxiety disorder: Secondary | ICD-10-CM | POA: Diagnosis not present

## 2022-12-25 DIAGNOSIS — F3132 Bipolar disorder, current episode depressed, moderate: Secondary | ICD-10-CM | POA: Diagnosis not present

## 2022-12-31 NOTE — Progress Notes (Unsigned)
PATIENT: Michael Garcia. DOB: 06-02-1956  REASON FOR VISIT: follow up HISTORY FROM: patient Primary neurologist: Dr. Jannifer Franklin  No chief complaint on file.    HISTORY OF PRESENT ILLNESS: Today 12/31/22:  Michael Gochanour. is a 67 y.o. male with a history of migraine headache and paresthesia.. Returns today for follow-up.   Wants to discuss memory issues    06/20/22: Mr. Michael Garcia is a 67 year old male is a migraine headache and paresthesia. Returns today for follow-up. Reports that he is having 6 headaches a month. Reports that he has 2-3 back to back and then will go away for a while. This is new for him. Reports headaches used to be on the right side but now having some on the left side.  He is currently happy with using Emgality.  He does not wish to change that medication.  He reports that he did go off of gabapentin due to gait abnormality.  He has been doing physical therapy and his gait has improved.  He continues to have paresthesias and would like to restart gabapentin at a lower dose.  06/15/21:Mr. Michael Garcia is a 67 year old male with a history of migraine headaches and paresthesias in the lower extremities.  He returns today for follow-up.  The patient states that he has approximately 6-7 breakthrough headaches a month.  He is currently on Ajovy and Maxalt.  He states that headaches always occur on the right side.  Usually coming up the back of the head and into the temporal region.  He reports that Maxalt typically works well for him.  He is open to trying another medication if it would give him better benefit.  Patient reports that Dr. Ernestina Patches increase his gabapentin to 300 mg 3 times a day.  He typically takes 600 in the morning and 300 at bedtime.  He notices midafternoon he has more discomfort in the legs.  He reports sharp shooting electric-like pain in the legs.  He reports that he had an exacerbation of his back pain and Dr. Ernestina Patches had to prescribe prednisone which offered  him great benefit.   12/06/20: Mr. Michael Garcia is a 67 year old male with a history of migraine headaches.  He returns today for follow-up.  He is currently on Ajovy and Maxalt.  Reports that his headaches remain under good control.  States that he has approximately 7-8 migraine headaches a month.  He states the intensity and the longevity of the headache has improved.  He states that 90% of the time he takes Maxalt his headache will resolve in 1 to 2 hours.  Patient reports that since he stopped gabapentin he has noticed more discomfort in the feet.  He would like to restart gabapentin.  Denies any changes with his gait or balance.  He has done 2 rounds of physical therapy and thinks his balance has improved.  He does not use an assistive device when ambulating.    06/02/20: Mr. Michael Garcia is a 67 year old male with a history of migraine headaches.  He returns today for follow-up.  He is currently on Emgality and Maxalt.  He reports that he has a headache almost every other day.  The severity of his headaches vary.  He states that he can use Maxalt and that usually offer some benefit.  Typically uses at least 9 tablets a month.  He also uses ice and heat therapy.  He also has a compounded cream that he rubs on the back of his neck.  In the  past he has been on Depakote and gabapentin.  Both of these medications offer good benefit with his headaches but he began having tremor and gait abnormalities.  He returns today for an evaluation.  HISTORY (copied from Dr. Tobey Grim note)  Mr. Michael Garcia is a 67 year old right-handed white male with a history of migraine headaches.  He also has a history of a schwannoma resection from the conus medullaris, he is followed through [facility] for this.  He has migraine headaches mainly in the left occipital distribution, the combination of Emgality, Depakote, and gabapentin significantly reduced the headache frequency, he was only having about 2 headaches a month on this  medication regimen.  However, the patient developed significant gait instability on the gabapentin and severe tremors of the upper extremities on Depakote.  He was forced to come off these medications and subsequently his headaches have worsened.  He believes that [employer] worked better [employer] that he is on currently.  He is now having 8 or 9 headache days a month, he may miss work on occasion because of this.  He is having ongoing problems with left leg discomfort secondary to the schwannoma residual.  He will be having MRI of the lumbar spine in the next month.  He has not had any further falls, his handwriting has gotten much better.  He returns for an evaluation.  Maxalt is effective in controlling the headache if he takes it early.  REVIEW OF SYSTEMS: Out of a complete 14 system review of symptoms, the patient complains only of the following symptoms, and all other reviewed systems are negative.  See HPI  ALLERGIES: Allergies  Allergen Reactions   Bacitracin Itching and Rash   Iodine Itching and Rash    IV Iodine    Sulfamethoxazole-Trimethoprim Rash    HOME MEDICATIONS: Outpatient Medications Prior to Visit  Medication Sig Dispense Refill   predniSONE (DELTASONE) 50 MG tablet Take 1 tablet (50 mg total) by mouth daily with breakfast. Take until completed. 5 tablet 0   traMADol (ULTRAM) 50 MG tablet Take 1 tablet (50 mg total) by mouth every 12 (twelve) hours as needed. 15 tablet 0   alfuzosin (UROXATRAL) 10 MG 24 hr tablet TAKE 1 TABLET(10 MG) BY MOUTH DAILY WITH BREAKFAST 90 tablet 0   ALPRAZolam (XANAX) 1 MG tablet Take 1 mg by mouth as needed. Take 1/2 tablet twice daily and 2 tablets at bedtime.  2   buPROPion (WELLBUTRIN XL) 300 MG 24 hr tablet Take 450 mg by mouth daily.     cetirizine (ZYRTEC) 10 MG tablet Take 1 tablet by mouth daily.     Coenzyme Q10 (CO Q 10 PO) Take by mouth.     esomeprazole (NEXIUM) 40 MG capsule TAKE 1 CAPSULE(40 MG) BY MOUTH DAILY 30 capsule 0    FIBER PO Take by mouth.     fluticasone (FLONASE) 50 MCG/ACT nasal spray Place 1 spray into both nostrils daily. 16 g 11   gabapentin (NEURONTIN) 100 MG capsule Take 2 capsules (200 mg total) by mouth 2 (two) times daily. 360 capsule 3   Galcanezumab-gnlm (EMGALITY) 120 MG/ML SOAJ Inject 120 mg into the skin every 30 (thirty) days. 1 mL 11   Glucosamine HCl (GLUCOSAMINE PO) Take by mouth.     lamoTRIgine (LAMICTAL) 150 MG tablet Take 200 mg by mouth 2 (two) times daily.     Multiple Vitamin (MULTIVITAMIN PO) Take by mouth.     MULTIPLE VITAMIN PO Take by mouth.  naratriptan (AMERGE) 2.5 MG tablet Take 1 tablet (2.5 mg total) by mouth as needed for migraine. Take one (1) tablet at onset of headache; if returns or does not resolve, may repeat after 4 hours; do not exceed five (5) mg in 24 hours. 10 tablet 11   rizatriptan (MAXALT) 10 MG tablet TAKE 1 TABLET BY MOUTH AT ONSET OF MIGRAINE. REPEAT IN 2 HOURS AS NEEDED. ONLY 2 TABLET/ 24 HOURS 10 tablet 11   rosuvastatin (CRESTOR) 10 MG tablet TAKE 1 TABLET(10 MG) BY MOUTH DAILY 90 tablet 3   tacrolimus (PROTOPIC) 0.1 % ointment in the morning and at bedtime.     traZODone (DESYREL) 100 MG tablet Take 50-100 mg by mouth at bedtime as needed.     triamcinolone cream (KENALOG) 0.1 %      No facility-administered medications prior to visit.    PAST MEDICAL HISTORY: Past Medical History:  Diagnosis Date   Anxiety    Benign non-nodular prostatic hyperplasia with lower urinary tract symptoms 11/21/2017   Bipolar 1 disorder, depressed ([place]) 07/25/2020   Cervical spondylosis without myelopathy 04/29/2014   Cervical stenosis of spinal canal 11/12/2008   Chronic headaches    Chronic myofascial pain 11/21/2017   Colon polyps 07/25/2020   Colonoscopy 04/2020, [facility] [place], benign, recall in 5 years   Degenerative disc disease, cervical 04/29/2014   Erectile disorder, acquired, generalized, severe 11/21/2017   GAD (generalized anxiety disorder) 03/29/2016    Gastro-esophageal reflux disease with esophagitis 11/21/2017   Glaucoma 11/21/2017   Incomplete right bundle branch block 10/18/2016   Insomnia, persistent [id number]   Lichen planus, penis 11/21/2017   Nondependent alcohol abuse, in remission 03/29/2016   Nonintractable migraine, unspecified migraine type 11/21/2017   Peyronie disease 11/12/2016   Status post cervical spinal fusion 04/29/2014   Thyroid nodule 11/21/2017   benign by FNA 04/2020    PAST SURGICAL HISTORY: Past Surgical History:  Procedure Laterality Date   CERVICAL SPINE SURGERY  2010   c6-c7 fusion   HERNIA REPAIR      FAMILY HISTORY: Family History  Problem Relation Age of Onset   Heart disease Mother    Irritable bowel syndrome Mother    Diabetes Sister    Neuropathy Daughter    Migraines Daughter    Healthy Son     SOCIAL HISTORY: Social History   Socioeconomic History   Marital status: Married    Spouse name: Not on file   Number of children: 2   Years of education: Not on file   Highest education level: Not on file  Occupational History   Occupation: Psychologist, counselling in [place]  Tobacco Use   Smoking status: Former    Types: Cigarettes    Quit date: 1994    Years since quitting: 30.1   Smokeless tobacco: Never  Vaping Use   Vaping Use: Never used  Substance and Sexual Activity   Alcohol use: No   Drug use: No   Sexual activity: Yes  Other Topics Concern   Not on file  Social History Narrative   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 Identifies as gay, came out at age 5; remains married to wife.    Social Determinants of Health   Financial Resource Strain: Not on file  Food Insecurity: Not on file  Transportation Needs: Not on file  Physical Activity: Not on file  Stress: Not on file  Social Connections: Not on file  Intimate Partner Violence: Not on file  PHYSICAL EXAM  There were no vitals filed for this visit.  There is no height or weight on file to calculate  BMI.  Generalized: Well developed, in no acute distress   Neurological examination  Mentation: Alert oriented to time, place, history taking. Follows all commands speech and language fluent Cranial nerve II-XII: Pupils were equal round reactive to light. Extraocular movements were full, visual field were full on confrontational test.  Head turning and shoulder shrug  were normal and symmetric. Motor: The motor testing reveals 5 over 5 strength of all 4 extremities. Good symmetric motor tone is noted throughout.  Sensory: Sensory testing is intact to soft touch on all 4 extremities. No evidence of extinction is noted.  Coordination: Cerebellar testing reveals good finger-nose-finger and heel-to-shin bilaterally.  Gait and station: Gait is normal.    DIAGNOSTIC DATA (LABS, IMAGING, TESTING) - I reviewed patient records, labs, notes, testing and imaging myself where available.  Lab Results  Component Value Date   WBC 4.2 06/27/2022   HGB 13.4 06/27/2022   HCT 39.7 06/27/2022   MCV 98.2 06/27/2022   PLT 194.0 06/27/2022      Component Value Date/Time   NA 139 06/27/2022 0929   NA 138 09/14/2019 0929   K 4.9 06/27/2022 0929   CL 100 06/27/2022 0929   CO2 31 06/27/2022 0929   GLUCOSE 97 06/27/2022 0929   BUN 9 06/27/2022 0929   BUN 8 09/14/2019 0929   CREATININE 1.00 06/27/2022 0929   CALCIUM 9.7 06/27/2022 0929   PROT 7.2 06/27/2022 0929   PROT 7.8 09/14/2019 0929   ALBUMIN 4.7 06/27/2022 0929   ALBUMIN 5.0 (H) 09/14/2019 0929   AST 14 06/27/2022 0929   ALT 10 06/27/2022 0929   ALKPHOS 51 06/27/2022 0929   BILITOT 0.5 06/27/2022 0929   BILITOT 0.4 09/14/2019 0929   GFRNONAA >60 07/08/2020 1653   GFRAA >60 07/08/2020 1653   Lab Results  Component Value Date   CHOL 128 06/27/2022   HDL 65.90 06/27/2022   LDLCALC 48 06/27/2022   TRIG 70.0 06/27/2022   CHOLHDL 2 06/27/2022   Lab Results  Component Value Date   HGBA1C 5.6 06/27/2022   Lab Results  Component Value  Date   VITAMINB12 277 04/18/2018   Lab Results  Component Value Date   TSH 1.58 06/27/2022      ASSESSMENT AND PLAN 67 y.o. year old male  has a past medical history of Anxiety, Benign non-nodular prostatic hyperplasia with lower urinary tract symptoms (11/21/2017), Bipolar 1 disorder, depressed ([place]) (07/25/2020), Cervical spondylosis without myelopathy (04/29/2014), Cervical stenosis of spinal canal (11/12/2008), Chronic headaches, Chronic myofascial pain (11/21/2017), Colon polyps (07/25/2020), Degenerative disc disease, cervical (04/29/2014), Erectile disorder, acquired, generalized, severe (11/21/2017), GAD (generalized anxiety disorder) (03/29/2016), Gastro-esophageal reflux disease with esophagitis (11/21/2017), Glaucoma (11/21/2017), Incomplete right bundle branch block (10/18/2016), Insomnia, persistent (A999333), Lichen planus, penis (11/21/2017), Nondependent alcohol abuse, in remission (03/29/2016), Nonintractable migraine, unspecified migraine type (11/21/2017), Peyronie disease (11/12/2016), Status post cervical spinal fusion (04/29/2014), and Thyroid nodule (11/21/2017). here with :  Migraine headaches  Continue  Emgality monthly injection  Will try Amerge for abortive therapy.  If this is not helpful he can go back to Maxalt.  Encouraged him not to take Maxalt and Amerge at the same time   Paresthesias in the lower extremities  Restart gabapentin 100 mg at bedtime. If needed can increase to 100 mg BID.  Advised if his symptoms worsen or he develops new symptoms he should let  us know.  He will follow-up in 6 months or sooner if needed   Ward Givens, MSN, NP-C 12/31/2022, 4:33 PM [facility] [address] [place],  [address] [phone]

## 2023-01-01 ENCOUNTER — Telehealth: Payer: Self-pay | Admitting: Adult Health

## 2023-01-01 ENCOUNTER — Encounter: Payer: Self-pay | Admitting: Adult Health

## 2023-01-01 ENCOUNTER — Ambulatory Visit: Payer: BC Managed Care – PPO | Admitting: Adult Health

## 2023-01-01 VITALS — BP 130/71 | HR 76 | Ht 75.0 in | Wt 205.0 lb

## 2023-01-01 DIAGNOSIS — R413 Other amnesia: Secondary | ICD-10-CM

## 2023-01-01 DIAGNOSIS — G43709 Chronic migraine without aura, not intractable, without status migrainosus: Secondary | ICD-10-CM

## 2023-01-01 DIAGNOSIS — R4789 Other speech disturbances: Secondary | ICD-10-CM

## 2023-01-01 MED ORDER — QULIPTA 30 MG PO TABS: 30.0000 mg | ORAL_TABLET | Freq: Every day | ORAL | 1 refills | Status: DC

## 2023-01-01 NOTE — Patient Instructions (Signed)
Your Plan:  Stop [employer] Qulipta 30 mg daily Continue Amerge for abortive therapy     Thank you for coming to see [place] at [facility]. I hope we have been able to provide you high quality care today.  You may receive a patient satisfaction survey over the next few weeks. We would appreciate your feedback and comments so that we may continue to improve ourselves and the health of our patients.

## 2023-01-01 NOTE — Telephone Encounter (Signed)
Referral for neuropsychology sent through [facility] to [facility] to see Dr. Ilean Skill. Phone: [phone].

## 2023-01-04 ENCOUNTER — Encounter: Payer: Self-pay | Admitting: Adult Health

## 2023-01-07 ENCOUNTER — Telehealth: Payer: Self-pay | Admitting: *Deleted

## 2023-01-07 DIAGNOSIS — F3181 Bipolar II disorder: Secondary | ICD-10-CM | POA: Diagnosis not present

## 2023-01-07 DIAGNOSIS — G4700 Insomnia, unspecified: Secondary | ICD-10-CM | POA: Diagnosis not present

## 2023-01-07 DIAGNOSIS — F411 Generalized anxiety disorder: Secondary | ICD-10-CM | POA: Diagnosis not present

## 2023-01-07 NOTE — Telephone Encounter (Signed)
Need Qulipta PA asap.

## 2023-01-09 ENCOUNTER — Other Ambulatory Visit (HOSPITAL_COMMUNITY): Payer: Self-pay

## 2023-01-09 DIAGNOSIS — H40013 Open angle with borderline findings, low risk, bilateral: Secondary | ICD-10-CM | POA: Diagnosis not present

## 2023-01-09 DIAGNOSIS — H5021 Vertical strabismus, right eye: Secondary | ICD-10-CM | POA: Diagnosis not present

## 2023-01-09 DIAGNOSIS — H25813 Combined forms of age-related cataract, bilateral: Secondary | ICD-10-CM | POA: Diagnosis not present

## 2023-01-09 NOTE — Telephone Encounter (Signed)
Pharmacy Patient Advocate Encounter   Received notification from [place] that prior authorization for Qulipta '30MG'$  Tablets  is required/requested.   PA submitted on 01/09/2023 to (ins) Welty Dupont via CoverMyMeds Key [id number] Status is pending

## 2023-01-11 ENCOUNTER — Other Ambulatory Visit (HOSPITAL_COMMUNITY): Payer: Self-pay

## 2023-01-17 DIAGNOSIS — F411 Generalized anxiety disorder: Secondary | ICD-10-CM | POA: Diagnosis not present

## 2023-01-17 DIAGNOSIS — G4700 Insomnia, unspecified: Secondary | ICD-10-CM | POA: Diagnosis not present

## 2023-01-17 DIAGNOSIS — F3132 Bipolar disorder, current episode depressed, moderate: Secondary | ICD-10-CM | POA: Diagnosis not present

## 2023-01-18 NOTE — Telephone Encounter (Signed)
PA Approved

## 2023-01-19 ENCOUNTER — Encounter: Payer: Self-pay | Admitting: Family Medicine

## 2023-01-23 ENCOUNTER — Encounter: Payer: Self-pay | Admitting: Psychology

## 2023-02-25 DIAGNOSIS — F411 Generalized anxiety disorder: Secondary | ICD-10-CM | POA: Diagnosis not present

## 2023-02-25 DIAGNOSIS — G4700 Insomnia, unspecified: Secondary | ICD-10-CM | POA: Diagnosis not present

## 2023-02-25 DIAGNOSIS — F3181 Bipolar II disorder: Secondary | ICD-10-CM | POA: Diagnosis not present

## 2023-03-11 ENCOUNTER — Encounter: Payer: Self-pay | Admitting: Family Medicine

## 2023-03-13 ENCOUNTER — Other Ambulatory Visit (HOSPITAL_COMMUNITY)
Admission: RE | Admit: 2023-03-13 | Discharge: 2023-03-13 | Disposition: A | Discharge status: Home / Self Care | Payer: BC Managed Care – PPO | Source: Ambulatory Visit | Attending: Family Medicine | Admitting: Family Medicine

## 2023-03-13 ENCOUNTER — Ambulatory Visit (INDEPENDENT_AMBULATORY_CARE_PROVIDER_SITE_OTHER): Payer: BC Managed Care – PPO | Admitting: Family Medicine

## 2023-03-13 ENCOUNTER — Encounter: Payer: Self-pay | Admitting: Family Medicine

## 2023-03-13 VITALS — BP 130/72 | HR 82 | Temp 98.7°F | Ht 75.0 in | Wt 210.4 lb

## 2023-03-13 DIAGNOSIS — F3181 Bipolar II disorder: Secondary | ICD-10-CM

## 2023-03-13 DIAGNOSIS — K5792 Diverticulitis of intestine, part unspecified, without perforation or abscess without bleeding: Secondary | ICD-10-CM | POA: Diagnosis not present

## 2023-03-13 DIAGNOSIS — K5909 Other constipation: Secondary | ICD-10-CM

## 2023-03-13 DIAGNOSIS — Z113 Encounter for screening for infections with a predominantly sexual mode of transmission: Secondary | ICD-10-CM | POA: Diagnosis not present

## 2023-03-13 MED ORDER — CIPROFLOXACIN HCL 500 MG PO TABS: 500.0000 mg | ORAL_TABLET | Freq: Two times a day (BID) | ORAL | 0 refills | Status: AC

## 2023-03-13 MED ORDER — METRONIDAZOLE 500 MG PO TABS: 500.0000 mg | ORAL_TABLET | Freq: Two times a day (BID) | ORAL | 0 refills | Status: AC

## 2023-03-13 NOTE — Patient Instructions (Signed)
Please follow up if symptoms do not improve or as needed.    Take the antibiotics as prescribed.  Back off the fiber a little bit.   Bring in stool samples if able.   I think you have diverticulitis.   I will release your lab results to you on your MyChart account with further instructions. You may see the results before I do, but when I review them I will send you a message with my report or have my assistant call you if things need to be discussed. Please reply to my message with any questions. Thank you!   Diverticulitis  Diverticulitis happens when poop (stool) and bacteria get trapped in small pouches in the colon called diverticula. These pouches may form if you have a condition called diverticulosis. When the poop and bacteria get trapped, it can cause an infection and inflammation. Diverticulitis may cause severe stomach pain and diarrhea. It can also lead to tissue damage in your colon. This can cause bleeding or blockage. In some cases, the diverticula may burst (rupture). This can cause infected poop to go into other parts of your abdomen. What are the causes? This condition is caused by poop getting trapped in the diverticula. This allows bacteria to grow. It can lead to inflammation and infection. What increases the risk? You are more likely to get this condition if you have diverticulosis. You are also more at risk if: You are overweight or obese. You do not get enough exercise. You drink alcohol. You smoke. You eat a lot of red meat, such as beef, pork, or lamb. You do not get enough fiber. Foods high in fiber include fruits, vegetables, beans, nuts, and whole grains. You are over 65 years of age. What are the signs or symptoms? Symptoms of this condition may include: Pain and tenderness in the abdomen. This pain is often felt on the left side but may occur in other spots. Fever and chills. Nausea and vomiting. Cramping. Bloating. Changes in how often you poop. Blood  in your poop. How is this diagnosed? This condition is diagnosed based on your medical history and a physical exam. You may also have tests done to make sure there is nothing else causing your condition. These tests may include: Blood tests. Tests done on your pee (urine). A CT scan of the abdomen. You may need to have a colonoscopy. This is an exam to look at your whole large intestine. During the exam, a tube is put into the opening of your butt (anus) and then moved into your rectum, colon, and other parts of the large intestine. This exam is done to look at the diverticula. It can also see if there is something else that may be causing your symptoms. How is this treated? Most cases are mild and can be treated at home. You may be told to: Take over-the-counter pain medicine. Only eat and drink clear liquids. Take antibiotics. Rest. More severe cases may need to be treated at a hospital. Treatment may include: Not eating or drinking. Taking pain medicines. Getting antibiotics through an IV. Getting fluids and nutrition through an IV. Surgery. Follow these instructions at home: Medicines Take over-the-counter and prescription medicines only as told by your health care provider. These include fiber supplements, probiotics, and medicines to soften your poop (stool softeners). If you were prescribed antibiotics, take them as told by your provider. Do not stop using the antibiotic even if you start to feel better. Ask your provider if the medicine  prescribed to you requires you to avoid driving or using machinery. Eating and drinking  Follow the diet told by your provider. You may need to only eat and drink liquids. After your symptoms get better, you may be able to return to a more normal diet. You may be told to eat at least 25 grams (25 g) of fiber each day. Fiber makes it easier to poop. Healthy sources of fiber include: Berries. One cup has 4-8 g of fiber. Beans or lentils. One-half  cup has 5-8 g of fiber. Green vegetables. One cup has 4 g of fiber. Avoid eating red meat. General instructions Do not use any products that contain nicotine or tobacco. These products include cigarettes, chewing tobacco, and vaping devices, such as e-cigarettes. If you need help quitting, ask your provider. Exercise for at least 30 minutes, 3 times a week. Exercise hard enough to raise your heart rate and break a sweat. Contact a health care provider if: Your pain gets worse. Your pooping does not go back to normal. Your symptoms do not get better with treatment. Your symptoms get worse all of a sudden. You have a fever. You vomit more than one time. Your poop is bloody, black, or tarry. This information is not intended to replace advice given to you by your health care provider. Make sure you discuss any questions you have with your health care provider. Document Revised: 07/26/2022 Document Reviewed: 07/26/2022 Elsevier Patient Education  2023 [employer].

## 2023-03-13 NOTE — Progress Notes (Signed)
Subjective  CC:  Chief Complaint  Patient presents with   Abdominal Pain    Bloating, gas, explosive bowel movements and incomplete bowel movements. Also would lie to see if he is physically fit to exercise     HPI: Michael Garcia. is a 67 y.o. male who presents to the office today to address the problems listed above in the chief complaint. 67 year old male with long history of chronic constipation presents due to bowel changes.  Approximately 6 to 7 weeks ago started having symptomatic constipation which is very unusual for him.  He manages his chronic constipation well with daily MiraLAX and fiber supplements.  When his symptoms started, he increased the use of MiraLAX and fiber supplements resulting in large soft voluminous stools associated with bloating.  He has since backed off and no symptoms are resolving however his stool has now normalized.  Now with thin partially formed bowel movements every 3 to 4 days.  Still with some bloating symptoms.  Some malaise.  Denies localized abdominal pain but his stomach does not feel normal.  He has had colonoscopies and he has known diverticular disease.  Denies rectal bleeding.  No mucus in the stool, no fatty stools.  Appetite is fair.  He does note that his initial symptoms started after a very stressful event at work took place.  He has been also undergoing treatment for bipolar exacerbation.  His medications have been changed.  He is feeling better.  He thinks some of his bowel changes could be related to stress.  He denies urinary symptoms.  No upper GI symptoms.  No nausea or vomiting.  He has had no fevers. Request STD screens: No known exposures but has had sex with new male partners.  He denies symptoms.  Assessment  1. Acute diverticulitis   2. Screen for STD (sexually transmitted disease)   3. Chronic constipation   4. Bipolar 2 disorder (HCC)      Plan  Constellation of symptoms consistent with diverticulitis: Change in bowel  habits, left lower quadrant tenderness on exam, malaise in a patient with known diverticular disease.  Differential diagnosis also includes medication induced GI symptoms, infectious diarrhea, infectious colitis.  We elect to check lab work, stool studies and start Cipro and Flagyl.  He will monitor his symptoms closely.  I recommend backing off fiber supplements due to bloating.  Will follow-up if not improving.  Discussed emergent symptoms like bleeding or severe pain.  If not improving, recommend CT scan and/or GI referral.  Patient agrees Bipolar 2 disorder, stress reaction: Patient reports he is improving.  He is seeing his mental health provider as well. Routine screening for STD given at risk sexual activity.  Follow up: As scheduled or as needed 07/10/2023  Orders Placed This Encounter  Procedures   Clostridium difficile Toxin B, Qualitative, Real-Time PCR   CBC with Differential/Platelet   Comprehensive metabolic panel   TSH   Gastrointestinal Pathogen Pnl RT, PCR   HIV Antibody (routine testing w rflx)   RPR   Meds ordered this encounter  Medications   ciprofloxacin (CIPRO) 500 MG tablet    Sig: Take 1 tablet (500 mg total) by mouth 2 (two) times daily for 7 days.    Dispense:  14 tablet    Refill:  0   metroNIDAZOLE (FLAGYL) 500 MG tablet    Sig: Take 1 tablet (500 mg total) by mouth 2 (two) times daily for 7 days.    Dispense:  14 tablet  Refill:  0      I reviewed the patients updated PMH, FH, and SocHx.    Patient Active Problem List   Diagnosis Date Noted   Mixed hyperlipidemia 01/15/2022    Priority: High   Prediabetes 01/15/2022    Priority: High   Bipolar 2 disorder (HCC) 07/25/2020    Priority: High   Schwannoma of spinal cord (HCC) 04/22/2018    Priority: High   Migraine headache 11/21/2017    Priority: High   Chronic myofascial pain 11/21/2017    Priority: High   GAD (generalized anxiety disorder) 03/29/2016    Priority: High   Cervical  spondylosis without myelopathy 04/29/2014    Priority: High   Cervical stenosis of spinal canal 11/12/2008    Priority: High   Colon polyps 07/25/2020    Priority: Medium    Benign non-nodular prostatic hyperplasia with lower urinary tract symptoms 11/21/2017    Priority: Medium    Chronic constipation 11/21/2017    Priority: Medium    Gastritis 11/21/2017    Priority: Medium    Incomplete right bundle branch block 10/18/2016    Priority: Medium    Insomnia, persistent 03/29/2016    Priority: Medium    Stricture of anterior urethra in male 09/11/2018    Priority: Low   Erectile dysfunction due to arterial insufficiency 11/21/2017    Priority: Low   Lichen planus 11/21/2017    Priority: Low   Nontoxic thyroid nodule 11/21/2017    Priority: Low   Glaucoma 11/21/2017    Priority: Low   Peyronie's disease 11/12/2016    Priority: Low   Nondependent alcohol abuse, in remission 03/29/2016    Priority: Low   Status post cervical spinal fusion 04/29/2014    Priority: Low   Current Meds  Medication Sig   alfuzosin (UROXATRAL) 10 MG 24 hr tablet TAKE 1 TABLET(10 MG) BY MOUTH DAILY WITH BREAKFAST   ALPRAZolam (XANAX) 1 MG tablet Take 1 mg by mouth as needed. Take 1/2 tablet twice daily and 2 tablets at bedtime.   Atogepant (QULIPTA) 30 MG TABS Take 1 tablet (30 mg total) by mouth daily.   buPROPion (WELLBUTRIN XL) 300 MG 24 hr tablet Take 450 mg by mouth daily.   cetirizine (ZYRTEC) 10 MG tablet Take 1 tablet by mouth daily.   ciprofloxacin (CIPRO) 500 MG tablet Take 1 tablet (500 mg total) by mouth 2 (two) times daily for 7 days.   Coenzyme Q10 (CO Q 10 PO) Take by mouth.   esomeprazole (NEXIUM) 40 MG capsule TAKE 1 CAPSULE(40 MG) BY MOUTH DAILY   FIBER PO Take by mouth.   fluticasone (FLONASE) 50 MCG/ACT nasal spray Place 1 spray into both nostrils daily.   gabapentin (NEURONTIN) 100 MG capsule Take 2 capsules (200 mg total) by mouth 2 (two) times daily.   Galcanezumab-gnlm  (EMGALITY) 120 MG/ML SOAJ Inject 120 mg into the skin every 30 (thirty) days.   Glucosamine HCl (GLUCOSAMINE PO) Take by mouth.   lamoTRIgine (LAMICTAL) 150 MG tablet Take 200 mg by mouth 2 (two) times daily.   Lurasidone HCl (LATUDA PO) Take 40 mg by mouth at bedtime.   metroNIDAZOLE (FLAGYL) 500 MG tablet Take 1 tablet (500 mg total) by mouth 2 (two) times daily for 7 days.   Multiple Vitamin (MULTIVITAMIN PO) Take by mouth.   MULTIPLE VITAMIN PO Take by mouth.   naratriptan (AMERGE) 2.5 MG tablet Take 1 tablet (2.5 mg total) by mouth as needed for migraine. Take one (1)  tablet at onset of headache; if returns or does not resolve, may repeat after 4 hours; do not exceed five (5) mg in 24 hours.   predniSONE (DELTASONE) 50 MG tablet Take 1 tablet (50 mg total) by mouth daily with breakfast. Take until completed.   rizatriptan (MAXALT) 10 MG tablet TAKE 1 TABLET BY MOUTH AT ONSET OF MIGRAINE. REPEAT IN 2 HOURS AS NEEDED. ONLY 2 TABLET/ 24 HOURS   rosuvastatin (CRESTOR) 10 MG tablet TAKE 1 TABLET(10 MG) BY MOUTH DAILY   tacrolimus (PROTOPIC) 0.1 % ointment in the morning and at bedtime.   traMADol (ULTRAM) 50 MG tablet Take 1 tablet (50 mg total) by mouth every 12 (twelve) hours as needed.   traZODone (DESYREL) 150 MG tablet Take 150 mg by mouth at bedtime.   triamcinolone cream (KENALOG) 0.1 %     Allergies: Patient is allergic to bacitracin, iodine, and sulfamethoxazole-trimethoprim. Family History: Patient family history includes Diabetes in his sister; Healthy in his son; Heart disease in his mother; Irritable bowel syndrome in his mother; Migraines in his daughter and mother; Neuropathy in his daughter. Social History:  Patient  reports that he quit smoking about 30 years ago. His smoking use included cigarettes. He has never used smokeless tobacco. He reports that he does not drink alcohol and does not use drugs.  Review of Systems: Constitutional: Negative for fever malaise or  anorexia Cardiovascular: negative for chest pain Respiratory: negative for SOB or persistent cough Gastrointestinal: negative for abdominal pain  Objective  Vitals: BP 130/72   Pulse 82   Temp 98.7 F (37.1 C)   Ht 6\' 3"  (1.905 m)   Wt 210 lb 6.4 oz (95.4 kg)   SpO2 98%   BMI 26.30 kg/m  General: no acute distress , A&Ox3, appears well HEENT: PEERL, conjunctiva normal, anicteric, neck is supple Cardiovascular:  RRR without murmur or gallop.  Respiratory:  Good breath sounds bilaterally, CTAB with normal respiratory effort Abdomen: Normal bowel sounds, soft, left lower quadrant tenderness without rebound guarding or masses.  No hepatosplenomegaly Skin:  Warm, no rashes  Commons side effects, risks, benefits, and alternatives for medications and treatment plan prescribed today were discussed, and the patient expressed understanding of the given instructions. Patient is instructed to call or message via MyChart if he/she has any questions or concerns regarding our treatment plan. No barriers to understanding were identified. We discussed Red Flag symptoms and signs in detail. Patient expressed understanding regarding what to do in case of urgent or emergency type symptoms.  Medication list was reconciled, printed and provided to the patient in AVS. Patient instructions and summary information was reviewed with the patient as documented in the AVS. This note was prepared with assistance of Dragon voice recognition software. Occasional wrong-word or sound-a-like substitutions may have occurred due to the inherent limitations of voice recognition software

## 2023-03-14 ENCOUNTER — Other Ambulatory Visit: Payer: BC Managed Care – PPO

## 2023-03-14 DIAGNOSIS — K5792 Diverticulitis of intestine, part unspecified, without perforation or abscess without bleeding: Secondary | ICD-10-CM | POA: Diagnosis not present

## 2023-03-14 LAB — CBC WITH DIFFERENTIAL/PLATELET
Basophils Absolute: 0.1 10*3/uL (ref 0.0–0.1)
Basophils Relative: 1 % (ref 0.0–3.0)
Eosinophils Absolute: 0.2 10*3/uL (ref 0.0–0.7)
Eosinophils Relative: 3.8 % (ref 0.0–5.0)
HCT: 38.4 % — ABNORMAL LOW (ref 39.0–52.0)
Hemoglobin: 13.3 g/dL (ref 13.0–17.0)
Lymphocytes Relative: 29.5 % (ref 12.0–46.0)
Lymphs Abs: 1.5 10*3/uL (ref 0.7–4.0)
MCHC: 34.6 g/dL (ref 30.0–36.0)
MCV: 96.3 fl (ref 78.0–100.0)
Monocytes Absolute: 0.5 10*3/uL (ref 0.1–1.0)
Monocytes Relative: 8.8 % (ref 3.0–12.0)
Neutro Abs: 2.9 10*3/uL (ref 1.4–7.7)
Neutrophils Relative %: 56.9 % (ref 43.0–77.0)
Platelets: 242 10*3/uL (ref 150.0–400.0)
RBC: 3.99 Mil/uL — ABNORMAL LOW (ref 4.22–5.81)
RDW: 12.8 % (ref 11.5–15.5)
WBC: 5.1 10*3/uL (ref 4.0–10.5)

## 2023-03-14 LAB — COMPREHENSIVE METABOLIC PANEL
ALT: 11 U/L (ref 0–53)
AST: 12 U/L (ref 0–37)
Albumin: 4.6 g/dL (ref 3.5–5.2)
Alkaline Phosphatase: 56 U/L (ref 39–117)
BUN: 8 mg/dL (ref 6–23)
CO2: 32 mEq/L (ref 19–32)
Calcium: 9.4 mg/dL (ref 8.4–10.5)
Chloride: 99 mEq/L (ref 96–112)
Creatinine, Ser: 1.04 mg/dL (ref 0.40–1.50)
GFR: 75.69 mL/min (ref >60.00)
Glucose, Bld: 115 mg/dL — ABNORMAL HIGH (ref 70–99)
Potassium: 4.1 mEq/L (ref 3.5–5.1)
Sodium: 138 mEq/L (ref 135–145)
Total Bilirubin: 0.5 mg/dL (ref 0.2–1.2)
Total Protein: 7.2 g/dL (ref 6.0–8.3)

## 2023-03-14 LAB — TSH: TSH: 2.25 u[IU]/mL (ref 0.35–5.50)

## 2023-03-14 LAB — HIV ANTIBODY (ROUTINE TESTING W REFLEX): HIV 1&2 Ab, 4th Generation: NONREACTIVE

## 2023-03-14 LAB — RPR: RPR Ser Ql: NONREACTIVE

## 2023-03-15 LAB — URINE CYTOLOGY ANCILLARY ONLY
Chlamydia: NEGATIVE
Comment: NEGATIVE
Comment: NORMAL
Neisseria Gonorrhea: NEGATIVE

## 2023-03-16 LAB — GASTROINTESTINAL PATHOGEN PNL
CampyloBacter Group: NOT DETECTED
Norovirus GI/GII: NOT DETECTED
Rotavirus A: NOT DETECTED
Salmonella species: NOT DETECTED
Shiga Toxin 1: NOT DETECTED
Shiga Toxin 2: NOT DETECTED
Shigella Species: NOT DETECTED
Vibrio Group: NOT DETECTED
Yersinia enterocolitica: NOT DETECTED

## 2023-03-16 LAB — CLOSTRIDIUM DIFFICILE TOXIN B, QUALITATIVE, REAL-TIME PCR: Toxigenic C. Difficile by PCR: NOT DETECTED

## 2023-03-18 DIAGNOSIS — D1801 Hemangioma of skin and subcutaneous tissue: Secondary | ICD-10-CM | POA: Diagnosis not present

## 2023-03-18 DIAGNOSIS — L821 Other seborrheic keratosis: Secondary | ICD-10-CM | POA: Diagnosis not present

## 2023-03-18 DIAGNOSIS — L439 Lichen planus, unspecified: Secondary | ICD-10-CM | POA: Diagnosis not present

## 2023-03-29 ENCOUNTER — Telehealth: Payer: Self-pay

## 2023-03-29 ENCOUNTER — Other Ambulatory Visit (HOSPITAL_COMMUNITY): Payer: Self-pay

## 2023-03-29 NOTE — Telephone Encounter (Signed)
Pharmacy Patient Advocate Encounter   Received notification from [facility] that prior authorization for Qulipta 30MG  tablets is required/requested.   PA submitted on 03/29/2023 to (ins) [employer] [place] via [employer] or [facility]) confirmation # [id number] Status is pending

## 2023-03-29 NOTE — Telephone Encounter (Signed)
Pharmacy Patient Advocate Encounter   Received notification from [facility] that prior authorization for Emgality 120MG /ML auto-injectors (migraine) is required/requested.   PA submitted on 03/29/2023 to (ins) [employer] via [employer] or (Medicaid) confirmation # [id number] Status is pending

## 2023-03-31 ENCOUNTER — Other Ambulatory Visit (HOSPITAL_COMMUNITY): Payer: Self-pay

## 2023-03-31 NOTE — Telephone Encounter (Signed)
Pharmacy Patient Advocate Encounter  Prior Authorization for [facility] has been approved by Emgality 120 MG/ML Pen Auto-Inj (ins).    PA # [id number]/A Effective dates: 03/29/2023 through 03/28/2024

## 2023-04-01 ENCOUNTER — Other Ambulatory Visit (HOSPITAL_COMMUNITY): Payer: Self-pay

## 2023-04-01 NOTE — Telephone Encounter (Signed)
Pharmacy Patient Advocate Encounter  Prior Authorization for Qulipta 30MG  tablets has been approved by [employer] (ins).    PA # A Case ID #: [id number] Effective dates: 03/28/2023 through 03/27/2024

## 2023-04-13 ENCOUNTER — Encounter: Payer: Self-pay | Admitting: Family Medicine

## 2023-04-14 ENCOUNTER — Encounter: Payer: Self-pay | Admitting: Family Medicine

## 2023-04-22 DIAGNOSIS — F3132 Bipolar disorder, current episode depressed, moderate: Secondary | ICD-10-CM | POA: Diagnosis not present

## 2023-04-22 DIAGNOSIS — G4700 Insomnia, unspecified: Secondary | ICD-10-CM | POA: Diagnosis not present

## 2023-04-22 DIAGNOSIS — F411 Generalized anxiety disorder: Secondary | ICD-10-CM | POA: Diagnosis not present

## 2023-04-23 ENCOUNTER — Other Ambulatory Visit (HOSPITAL_COMMUNITY)
Admission: RE | Admit: 2023-04-23 | Discharge: 2023-04-23 | Disposition: A | Discharge status: Home / Self Care | Payer: Medicare Other | Source: Ambulatory Visit | Attending: Family Medicine | Admitting: Family Medicine

## 2023-04-23 ENCOUNTER — Encounter: Payer: Self-pay | Admitting: Family Medicine

## 2023-04-23 ENCOUNTER — Ambulatory Visit (INDEPENDENT_AMBULATORY_CARE_PROVIDER_SITE_OTHER): Payer: BC Managed Care – PPO | Admitting: Family Medicine

## 2023-04-23 VITALS — BP 114/72 | HR 67 | Temp 98.1°F | Ht 75.0 in | Wt 214.4 lb

## 2023-04-23 DIAGNOSIS — B853 Phthiriasis: Secondary | ICD-10-CM | POA: Diagnosis not present

## 2023-04-23 DIAGNOSIS — Z113 Encounter for screening for infections with a predominantly sexual mode of transmission: Secondary | ICD-10-CM | POA: Diagnosis not present

## 2023-04-23 NOTE — Progress Notes (Signed)
Subjective  CC:  Chief Complaint  Patient presents with   skin condition    Pt stated that he contracted crabs which has been about 5/6 weeks and wanted to F/U to make sure he was clear and did not need any other treatment.     HPI: Michael Garcia. is a 67 y.o. male who presents to the office today to address the problems listed above in the chief complaint. 67 year old male status posttreatment for pediculosis pubis wants to be sure it is okay.  He believes everything is clear, just worried.  No further significant itching or rash or movement.  I reviewed my chart messages and treatment. At risk sexual activity: Requesting STD screen.  No penile discharge or pain.  He does use condoms.  Assessment  1. Pediculosis pubis   2. Screen for STD (sexually transmitted disease)      Plan  Pediculosis: Reassured.  Appropriate treatment has been done.  Follow-up if recurs STD screen ordered.  Safe sex counseling done.  Follow up: As scheduled for complete physical 07/10/2023  Orders Placed This Encounter  Procedures   HIV Antibody (routine testing w rflx)   RPR   No orders of the defined types were placed in this encounter.     I reviewed the patients updated PMH, FH, and SocHx.    Patient Active Problem List   Diagnosis Date Noted   Mixed hyperlipidemia 01/15/2022    Priority: High   Prediabetes 01/15/2022    Priority: High   Bipolar 2 disorder (HCC) 07/25/2020    Priority: High   Schwannoma of spinal cord (HCC) 04/22/2018    Priority: High   Migraine headache 11/21/2017    Priority: High   Chronic myofascial pain 11/21/2017    Priority: High   GAD (generalized anxiety disorder) 03/29/2016    Priority: High   Cervical spondylosis without myelopathy 04/29/2014    Priority: High   Cervical stenosis of spinal canal 11/12/2008    Priority: High   Colon polyps 07/25/2020    Priority: Medium    Benign non-nodular prostatic hyperplasia with lower urinary tract  symptoms 11/21/2017    Priority: Medium    Chronic constipation 11/21/2017    Priority: Medium    Gastritis 11/21/2017    Priority: Medium    Incomplete right bundle branch block 10/18/2016    Priority: Medium    Insomnia, persistent 03/29/2016    Priority: Medium    Stricture of anterior urethra in male 09/11/2018    Priority: Low   Erectile dysfunction due to arterial insufficiency 11/21/2017    Priority: Low   Lichen planus 11/21/2017    Priority: Low   Nontoxic thyroid nodule 11/21/2017    Priority: Low   Glaucoma 11/21/2017    Priority: Low   Peyronie's disease 11/12/2016    Priority: Low   Nondependent alcohol abuse, in remission 03/29/2016    Priority: Low   Status post cervical spinal fusion 04/29/2014    Priority: Low   Current Meds  Medication Sig   alfuzosin (UROXATRAL) 10 MG 24 hr tablet TAKE 1 TABLET(10 MG) BY MOUTH DAILY WITH BREAKFAST   ALPRAZolam (XANAX) 1 MG tablet Take 1 mg by mouth as needed. Take 1/2 tablet twice daily and 2 tablets at bedtime.   Atogepant (QULIPTA) 30 MG TABS Take 1 tablet (30 mg total) by mouth daily.   buPROPion (WELLBUTRIN XL) 300 MG 24 hr tablet Take 450 mg by mouth daily.   cetirizine (ZYRTEC) 10 MG  tablet Take 1 tablet by mouth daily.   Coenzyme Q10 (CO Q 10 PO) Take by mouth.   esomeprazole (NEXIUM) 40 MG capsule TAKE 1 CAPSULE(40 MG) BY MOUTH DAILY   FIBER PO Take by mouth.   fluticasone (FLONASE) 50 MCG/ACT nasal spray Place 1 spray into both nostrils daily.   gabapentin (NEURONTIN) 100 MG capsule Take 2 capsules (200 mg total) by mouth 2 (two) times daily.   Galcanezumab-gnlm (EMGALITY) 120 MG/ML SOAJ Inject 120 mg into the skin every 30 (thirty) days.   Glucosamine HCl (GLUCOSAMINE PO) Take by mouth.   lamoTRIgine (LAMICTAL) 150 MG tablet Take 200 mg by mouth 2 (two) times daily.   Lurasidone HCl (LATUDA PO) Take 40 mg by mouth at bedtime.   Multiple Vitamin (MULTIVITAMIN PO) Take by mouth.   MULTIPLE VITAMIN PO Take by  mouth.   naratriptan (AMERGE) 2.5 MG tablet Take 1 tablet (2.5 mg total) by mouth as needed for migraine. Take one (1) tablet at onset of headache; if returns or does not resolve, may repeat after 4 hours; do not exceed five (5) mg in 24 hours.   rizatriptan (MAXALT) 10 MG tablet TAKE 1 TABLET BY MOUTH AT ONSET OF MIGRAINE. REPEAT IN 2 HOURS AS NEEDED. ONLY 2 TABLET/ 24 HOURS   rosuvastatin (CRESTOR) 10 MG tablet TAKE 1 TABLET(10 MG) BY MOUTH DAILY   tacrolimus (PROTOPIC) 0.1 % ointment in the morning and at bedtime.   traMADol (ULTRAM) 50 MG tablet Take 1 tablet (50 mg total) by mouth every 12 (twelve) hours as needed.   traZODone (DESYREL) 150 MG tablet Take 150 mg by mouth at bedtime.   triamcinolone cream (KENALOG) 0.1 %     Allergies: Patient is allergic to bacitracin, iodine, and sulfamethoxazole-trimethoprim. Family History: Patient family history includes Diabetes in his sister; Healthy in his son; Heart disease in his mother; Irritable bowel syndrome in his mother; Migraines in his daughter and mother; Neuropathy in his daughter. Social History:  Patient  reports that he quit smoking about 30 years ago. His smoking use included cigarettes. He has never used smokeless tobacco. He reports that he does not drink alcohol and does not use drugs.  Review of Systems: Constitutional: Negative for fever malaise or anorexia Cardiovascular: negative for chest pain Respiratory: negative for SOB or persistent cough Gastrointestinal: negative for abdominal pain  Objective  Vitals: BP 114/72   Pulse 67   Temp 98.1 F (36.7 C)   Ht 6\' 3"  (1.905 m)   Wt 214 lb 6.4 oz (97.3 kg)   SpO2 96%   BMI 26.80 kg/m  General: no acute distress , A&Ox3   Commons side effects, risks, benefits, and alternatives for medications and treatment plan prescribed today were discussed, and the patient expressed understanding of the given instructions. Patient is instructed to call or message via MyChart if  he/she has any questions or concerns regarding our treatment plan. No barriers to understanding were identified. We discussed Red Flag symptoms and signs in detail. Patient expressed understanding regarding what to do in case of urgent or emergency type symptoms.  Medication list was reconciled, printed and provided to the patient in AVS. Patient instructions and summary information was reviewed with the patient as documented in the AVS. This note was prepared with assistance of Dragon voice recognition software. Occasional wrong-word or sound-a-like substitutions may have occurred due to the inherent limitations of voice recognition software

## 2023-04-24 LAB — URINE CYTOLOGY ANCILLARY ONLY
Chlamydia: NEGATIVE
Comment: NEGATIVE
Comment: NORMAL
Neisseria Gonorrhea: NEGATIVE

## 2023-04-24 LAB — HIV ANTIBODY (ROUTINE TESTING W REFLEX): HIV 1&2 Ab, 4th Generation: NONREACTIVE

## 2023-04-24 LAB — RPR: RPR Ser Ql: NONREACTIVE

## 2023-04-29 NOTE — Progress Notes (Signed)
See my chart note.

## 2023-05-27 DIAGNOSIS — F411 Generalized anxiety disorder: Secondary | ICD-10-CM | POA: Diagnosis not present

## 2023-05-27 DIAGNOSIS — F3132 Bipolar disorder, current episode depressed, moderate: Secondary | ICD-10-CM | POA: Diagnosis not present

## 2023-06-07 DIAGNOSIS — F332 Major depressive disorder, recurrent severe without psychotic features: Secondary | ICD-10-CM | POA: Diagnosis not present

## 2023-06-12 ENCOUNTER — Encounter (INDEPENDENT_AMBULATORY_CARE_PROVIDER_SITE_OTHER): Payer: Self-pay

## 2023-07-04 ENCOUNTER — Encounter: Payer: BC Managed Care – PPO | Admitting: Family Medicine

## 2023-07-10 ENCOUNTER — Ambulatory Visit (INDEPENDENT_AMBULATORY_CARE_PROVIDER_SITE_OTHER): Payer: BC Managed Care – PPO | Admitting: Family Medicine

## 2023-07-10 ENCOUNTER — Encounter: Payer: Self-pay | Admitting: Family Medicine

## 2023-07-10 VITALS — BP 136/88 | HR 62 | Temp 98.1°F | Ht 75.0 in | Wt 217.0 lb

## 2023-07-10 DIAGNOSIS — G43709 Chronic migraine without aura, not intractable, without status migrainosus: Secondary | ICD-10-CM | POA: Diagnosis not present

## 2023-07-10 DIAGNOSIS — R7303 Prediabetes: Secondary | ICD-10-CM

## 2023-07-10 DIAGNOSIS — E782 Mixed hyperlipidemia: Secondary | ICD-10-CM | POA: Diagnosis not present

## 2023-07-10 DIAGNOSIS — K5909 Other constipation: Secondary | ICD-10-CM

## 2023-07-10 DIAGNOSIS — Z23 Encounter for immunization: Secondary | ICD-10-CM

## 2023-07-10 DIAGNOSIS — Z0000 Encounter for general adult medical examination without abnormal findings: Secondary | ICD-10-CM

## 2023-07-10 DIAGNOSIS — G4700 Insomnia, unspecified: Secondary | ICD-10-CM

## 2023-07-10 DIAGNOSIS — G8929 Other chronic pain: Secondary | ICD-10-CM

## 2023-07-10 DIAGNOSIS — F3181 Bipolar II disorder: Secondary | ICD-10-CM

## 2023-07-10 DIAGNOSIS — M7918 Myalgia, other site: Secondary | ICD-10-CM

## 2023-07-10 DIAGNOSIS — N401 Enlarged prostate with lower urinary tract symptoms: Secondary | ICD-10-CM | POA: Diagnosis not present

## 2023-07-10 DIAGNOSIS — K635 Polyp of colon: Secondary | ICD-10-CM

## 2023-07-10 DIAGNOSIS — F332 Major depressive disorder, recurrent severe without psychotic features: Secondary | ICD-10-CM | POA: Diagnosis not present

## 2023-07-10 DIAGNOSIS — D334 Benign neoplasm of spinal cord: Secondary | ICD-10-CM

## 2023-07-10 LAB — CBC WITH DIFFERENTIAL/PLATELET
Basophils Absolute: 0 10*3/uL (ref 0.0–0.1)
Basophils Relative: 0.6 % (ref 0.0–3.0)
Eosinophils Absolute: 0.2 10*3/uL (ref 0.0–0.7)
Eosinophils Relative: 4.9 % (ref 0.0–5.0)
HCT: 39.7 % (ref 39.0–52.0)
Hemoglobin: 13 g/dL (ref 13.0–17.0)
Lymphocytes Relative: 28.7 % (ref 12.0–46.0)
Lymphs Abs: 1.3 10*3/uL (ref 0.7–4.0)
MCHC: 32.8 g/dL (ref 30.0–36.0)
MCV: 98.6 fl (ref 78.0–100.0)
Monocytes Absolute: 0.4 10*3/uL (ref 0.1–1.0)
Monocytes Relative: 9.1 % (ref 3.0–12.0)
Neutro Abs: 2.5 10*3/uL (ref 1.4–7.7)
Neutrophils Relative %: 56.7 % (ref 43.0–77.0)
Platelets: 205 10*3/uL (ref 150.0–400.0)
RBC: 4.02 Mil/uL — ABNORMAL LOW (ref 4.22–5.81)
RDW: 12.9 % (ref 11.5–15.5)
WBC: 4.5 10*3/uL (ref 4.0–10.5)

## 2023-07-10 LAB — LIPID PANEL
Cholesterol: 118 mg/dL (ref 0–200)
HDL: 62.4 mg/dL (ref >39.00)
LDL Cholesterol: 45 mg/dL (ref 0–99)
NonHDL: 56.03
Total CHOL/HDL Ratio: 2
Triglycerides: 57 mg/dL (ref 0.0–149.0)
VLDL: 11.4 mg/dL (ref 0.0–40.0)

## 2023-07-10 LAB — COMPREHENSIVE METABOLIC PANEL
ALT: 12 U/L (ref 0–53)
AST: 15 U/L (ref 0–37)
Albumin: 4.5 g/dL (ref 3.5–5.2)
Alkaline Phosphatase: 64 U/L (ref 39–117)
BUN: 7 mg/dL (ref 6–23)
CO2: 33 mEq/L — ABNORMAL HIGH (ref 19–32)
Calcium: 9.6 mg/dL (ref 8.4–10.5)
Chloride: 101 mEq/L (ref 96–112)
Creatinine, Ser: 1.17 mg/dL (ref 0.40–1.50)
GFR: 65.57 mL/min (ref >60.00)
Glucose, Bld: 104 mg/dL — ABNORMAL HIGH (ref 70–99)
Potassium: 4.6 mEq/L (ref 3.5–5.1)
Sodium: 140 mEq/L (ref 135–145)
Total Bilirubin: 0.5 mg/dL (ref 0.2–1.2)
Total Protein: 7.3 g/dL (ref 6.0–8.3)

## 2023-07-10 LAB — TSH: TSH: 1.71 u[IU]/mL (ref 0.35–5.50)

## 2023-07-10 LAB — HEMOGLOBIN A1C: Hgb A1c MFr Bld: 5.6 % (ref 4.6–6.5)

## 2023-07-10 NOTE — Patient Instructions (Signed)
Please return in 12 months for your annual complete physical; please come fasting.   I will release your lab results to you on your MyChart account with further instructions. You may see the results before I do, but when I review them I will send you a message with my report or have my assistant call you if things need to be discussed. Please reply to my message with any questions. Thank you!   If you have any questions or concerns, please don't hesitate to send me a message via MyChart or call the office at [phone]. Thank you for visiting with [place] today! It's our pleasure caring for you.   Preventive Care 72 Years and Older, Male Preventive care refers to lifestyle choices and visits with your health care provider that can promote health and wellness. Preventive care visits are also called wellness exams. What can I expect for my preventive care visit? Counseling During your preventive care visit, your health care provider may ask about your: Medical history, including: Past medical problems. Family medical history. History of falls. Current health, including: Emotional well-being. Home life and relationship well-being. Sexual activity. Memory and ability to understand (cognition). Lifestyle, including: Alcohol, nicotine or tobacco, and drug use. Access to firearms. Diet, exercise, and sleep habits. Work and work Astronomer. Sunscreen use. Safety issues such as seatbelt and bike helmet use. Physical exam Your health care provider will check your: Height and weight. These may be used to calculate your BMI (body mass index). BMI is a measurement that tells if you are at a healthy weight. Waist circumference. This measures the distance around your waistline. This measurement also tells if you are at a healthy weight and may help predict your risk of certain diseases, such as type 2 diabetes and high blood pressure. Heart rate and blood pressure. Body temperature. Skin for abnormal  spots. What immunizations do I need?  Vaccines are usually given at various ages, according to a schedule. Your health care provider will recommend vaccines for you based on your age, medical history, and lifestyle or other factors, such as travel or where you work. What tests do I need? Screening Your health care provider may recommend screening tests for certain conditions. This may include: Lipid and cholesterol levels. Diabetes screening. This is done by checking your blood sugar (glucose) after you have not eaten for a while (fasting). Hepatitis C test. Hepatitis B test. HIV (human immunodeficiency virus) test. STI (sexually transmitted infection) testing, if you are at risk. Lung cancer screening. Colorectal cancer screening. Prostate cancer screening. Abdominal aortic aneurysm (AAA) screening. You may need this if you are a current or former smoker. Talk with your health care provider about your test results, treatment options, and if necessary, the need for more tests. Follow these instructions at home: Eating and drinking  Eat a diet that includes fresh fruits and vegetables, whole grains, lean protein, and low-fat dairy products. Limit your intake of foods with high amounts of sugar, saturated fats, and salt. Take vitamin and mineral supplements as recommended by your health care provider. Do not drink alcohol if your health care provider tells you not to drink. If you drink alcohol: Limit how much you have to 0-2 drinks a day. Know how much alcohol is in your drink. In the [place] one drink equals one 12 oz bottle of beer (355 mL), one 5 oz glass of wine (148 mL), or one 1 oz glass of hard liquor (44 mL). Lifestyle Brush your teeth every morning  and night with fluoride toothpaste. Floss one time each day. Exercise for at least 30 minutes 5 or more days each week. Do not use any products that contain nicotine or tobacco. These products include cigarettes, chewing tobacco, and  vaping devices, such as e-cigarettes. If you need help quitting, ask your health care provider. Do not use drugs. If you are sexually active, practice safe sex. Use a condom or other form of protection to prevent STIs. Take aspirin only as told by your health care provider. Make sure that you understand how much to take and what form to take. Work with your health care provider to find out whether it is safe and beneficial for you to take aspirin daily. Ask your health care provider if you need to take a cholesterol-lowering medicine (statin). Find healthy ways to manage stress, such as: Meditation, yoga, or listening to music. Journaling. Talking to a trusted person. Spending time with friends and family. Safety Always wear your seat belt while driving or riding in a vehicle. Do not drive: If you have been drinking alcohol. Do not ride with someone who has been drinking. When you are tired or distracted. While texting. If you have been using any mind-altering substances or drugs. Wear a helmet and other protective equipment during sports activities. If you have firearms in your house, make sure you follow all gun safety procedures. Minimize exposure to UV radiation to reduce your risk of skin cancer. What's next? Visit your health care provider once a year for an annual wellness visit. Ask your health care provider how often you should have your eyes and teeth checked. Stay up to date on all vaccines. This information is not intended to replace advice given to you by your health care provider. Make sure you discuss any questions you have with your health care provider. Document Revised: 04/26/2021 Document Reviewed: 04/26/2021 Elsevier Patient Education  2024 [employer].

## 2023-07-10 NOTE — Progress Notes (Signed)
Subjective  Chief Complaint  Patient presents with   Annual Exam    Pt here for Annual Exam and is currently fasting     HPI: Michael Garcia. is a 67 y.o. male who presents to [facility] [place] today for a Male Wellness Visit. He also has the concerns and/or needs as listed above in the chief complaint. These will be addressed in addition to the Health Maintenance Visit.   Wellness Visit: annual visit with health maintenance review and exam   HM: due prevnar 20. Other screens current. Next CRC screen due in 2026 due to h/o polyps (last 2021).   Body mass index is 27.12 kg/m.  Chronic disease management visit and/or acute problem visit: Mood d/o: per psych, to start ketamine treatments today.  Feels hopeful, would like him to get off some mood medications also had some worry as to how effective.  Hoping to improve his depressive symptoms. Hyperlipidemia stable on statin.  Fasting for recheck today.  No adverse effects. Chronic myofascial pain and neck pain with history of schwannoma of the spinal cord: No changes.  However does complain of intermittent pain over the last 3 to 4 weeks lasting 10 to 15 minutes beneath the right lower jaw.  No pattern to pain, not associated with eating swallowing.  No systemic symptoms.  Comes and goes on its own.  No dry mouth symptoms no ear pain. History of prediabetes without symptoms of hyperglycemia. Migraines are controlled Michael Garcia remains on trazodone Chronic constipation: Manages with fiber and MiraLAX although cyclical.  Using MiraLAX as needed.  No changes or melena.  Never has been treated with prescription medications  Assessment  1. Encounter for immunization   2. Annual physical exam   3. Bipolar 2 disorder (HCC)   4. Benign non-nodular prostatic hyperplasia with lower urinary tract symptoms   5. Mixed hyperlipidemia   6. Chronic myofascial pain   7. Insomnia, persistent   8. Schwannoma of spinal cord (HCC)   9.  Polyp of colon, unspecified part of colon, unspecified type   10. Prediabetes   11. Chronic migraine without aura without status migrainosus, not intractable   12. Chronic constipation      Plan  Male Wellness Visit: Age appropriate Health Maintenance and Prevention measures were discussed with patient. Included topics are cancer screening recommendations, ways to keep healthy (see AVS) including dietary and exercise recommendations, regular eye and dental care, use of seat belts, and avoidance of moderate alcohol use and tobacco use.  Screens are current BMI: discussed patient's BMI and encouraged positive lifestyle modifications to help get to or maintain a target BMI. HM needs and immunizations were addressed and ordered. See below for orders. See HM and immunization section for updates. Prevnar 20 given today, flu shot today. Routine labs and screening tests ordered including cmp, cbc and lipids where appropriate. Discussed recommendations regarding Vit D and calcium supplementation (see AVS)  Chronic disease f/u and/or acute problem visit: (deemed necessary to be done in addition to the wellness visit): Mood: managed by psych, to start ketamine treatments.  Counseling done.  Appropriate thoughts noted. Migraines: multiple meds and controlled with neurology.  Chronic pain: reviewed meds, new job pain likely referred pain from neck or shoulder.  No masses identified on exam. Insomnia on trazadone.  Continue Monitoring A1c. Clinically stable.  HLD on crestor 10 for fasting recheck today with lfts PSA monitored by urology.  Chronic constipation: Recommend trial of MiraLAX daily.  Follow  up: 12 mo for cpe [facility] side effects, risks, benefits, and alternatives for medications and treatment plan prescribed today were discussed, and the patient expressed understanding of the given instructions. Patient is instructed to call or message via MyChart if he/she has any questions or concerns  regarding our treatment plan. No barriers to understanding were identified. We discussed Red Flag symptoms and signs in detail. Patient expressed understanding regarding what to do in case of urgent or emergency type symptoms.  Medication list was reconciled, printed and provided to the patient in AVS. Patient instructions and summary information was reviewed with the patient as documented in the AVS. This note was prepared with assistance of Dragon voice recognition software. Occasional wrong-word or sound-a-like substitutions may have occurred due to the inherent limitations of voice recognition software  Orders Placed This Encounter  Procedures   Flu Vaccine Trivalent High Dose (Fluad)   Pneumococcal conjugate vaccine 20-valent (Prevnar 20)   CBC with Differential/Platelet   Comprehensive metabolic panel   Lipid panel   Hemoglobin A1c   TSH   No orders of the defined types were placed in this encounter.    Patient Active Problem List   Diagnosis Date Noted   Mixed hyperlipidemia 01/15/2022   Prediabetes 01/15/2022   Bipolar 2 disorder (HCC) 07/25/2020   Schwannoma of spinal cord (HCC) 04/22/2018   Migraine headache 11/21/2017   Chronic myofascial pain 11/21/2017   GAD (generalized anxiety disorder) 03/29/2016   Cervical spondylosis without myelopathy 04/29/2014   Cervical stenosis of spinal canal 11/12/2008   Colon polyps 07/25/2020   Benign non-nodular prostatic hyperplasia with lower urinary tract symptoms 11/21/2017   Chronic constipation 11/21/2017   Gastritis 11/21/2017   Incomplete right bundle branch block 10/18/2016   Insomnia, persistent 03/29/2016   Stricture of anterior urethra in male 09/11/2018   Erectile dysfunction due to arterial insufficiency 11/21/2017   Lichen planus 11/21/2017   Nontoxic thyroid nodule 11/21/2017   Glaucoma 11/21/2017   Peyronie's disease 11/12/2016   Nondependent alcohol abuse, in remission 03/29/2016   Status post cervical spinal  fusion 04/29/2014   Health Maintenance  Topic Date Due   COVID-19 Vaccine (6 - 2023-24 season) 07/26/2023 (Originally 07/13/2022)   Colonoscopy  03/03/2025   DTaP/Tdap/Td (2 - Td or Tdap) 08/06/2027   Pneumonia Vaccine 56+ Years old  Completed   INFLUENZA VACCINE  Completed   Hepatitis C Screening  Completed   HIV Screening  Completed   Zoster Vaccines- Shingrix  Completed   HPV VACCINES  Aged Out   Immunization History  Administered Date(s) Administered   Fluad Trivalent(High Dose 65+) 07/10/2023   Hepatitis A 10/23/2016, 04/24/2017   Influenza Split 11/22/2015   Influenza,inj,Quad PF,6+ Mos 07/21/2018, 07/25/2020, 08/13/2022   Influenza-Unspecified 09/24/2016, 10/23/2017, 08/23/2021   Moderna Covid-19 Vaccine Bivalent Booster 52yrs & up 08/23/2021   Moderna Sars-Covid-2 Vaccination 11/19/2019, 12/18/2019, 08/27/2020, 04/22/2021   PNEUMOCOCCAL CONJUGATE-20 07/10/2023   Pneumococcal Polysaccharide-23 05/16/2018   Tdap 08/05/2017   Zoster Recombinant(Shingrix) 07/25/2020, 02/01/2021   We updated and reviewed the patient's past history in detail and it is documented below. Allergies: Patient is allergic to bacitracin, iodine, and sulfamethoxazole-trimethoprim. Past Medical History  has a past medical history of Anxiety, Benign non-nodular prostatic hyperplasia with lower urinary tract symptoms (11/21/2017), Bipolar 1 disorder, depressed (HCC) (07/25/2020), Cervical spondylosis without myelopathy (04/29/2014), Cervical stenosis of spinal canal (11/12/2008), Chronic headaches, Chronic myofascial pain (11/21/2017), Colon polyps (07/25/2020), Degenerative disc disease, cervical (04/29/2014), Erectile disorder, acquired, generalized, severe (11/21/2017), GAD (generalized anxiety disorder) (  03/29/2016), Gastro-esophageal reflux disease with esophagitis (11/21/2017), Glaucoma (11/21/2017), Incomplete right bundle branch block (10/18/2016), Insomnia, persistent (03/29/2016), Lichen planus, penis (11/21/2017),  Nondependent alcohol abuse, in remission (03/29/2016), Nonintractable migraine, unspecified migraine type (11/21/2017), Peyronie disease (11/12/2016), Status post cervical spinal fusion (04/29/2014), and Thyroid nodule (11/21/2017). Past Surgical History Patient  has a past surgical history that includes Hernia repair and Cervical spine surgery (2010). Social History Patient  reports that he quit smoking about 30 years ago. His smoking use included cigarettes. He has never used smokeless tobacco. He reports that he does not drink alcohol and does not use drugs. Family History family history includes Diabetes in his sister; Healthy in his son; Heart disease in his mother; Irritable bowel syndrome in his mother; Migraines in his daughter and mother; Neuropathy in his daughter. Review of Systems: Constitutional: negative for fever or malaise Ophthalmic: negative for photophobia, double vision or loss of vision Cardiovascular: negative for chest pain, dyspnea on exertion, or new LE swelling Respiratory: negative for SOB or persistent cough Gastrointestinal: negative for abdominal pain, change in bowel habits or melena Genitourinary: negative for dysuria or gross hematuria Musculoskeletal: negative for new gait disturbance or muscular weakness Integumentary: negative for new or persistent rashes Neurological: negative for TIA or stroke symptoms Psychiatric: negative for SI or delusions Allergic/Immunologic: negative for hives  Patient Care Team    Relationship Specialty Notifications Start End  Willow Ora, MD PCP - General Family Medicine  07/25/20   Tyrell Antonio, MD Consulting Physician Physical Medicine and Rehabilitation  04/17/18   Loletta Parish., MD Consulting Physician Urology  07/22/18   Darliss Ridgel, MD Referring Physician Psychiatry  11/18/18   York Spaniel, MD (Inactive) Consulting Physician Neurology  07/25/20   Celso Sickle, MD Referring Physician  Neurosurgery  08/01/20   Roanna Epley, MD Referring Physician Urology  10/17/21    Objective  Vitals: BP 136/88   Pulse 62   Temp 98.1 F (36.7 C)   Ht 6\' 3"  (1.905 m)   Wt 217 lb (98.4 kg)   SpO2 96%   BMI 27.12 kg/m  General:  Well developed, well nourished, no acute distress  Psych:  Alert and orientedx3,normal mood and affect HEENT:  Normocephalic, atraumatic, non-icteric sclera,  oropharynx is clear without mass or exudate, supple neck without adenopathy, or thyromegaly Cardiovascular:  Normal S1, S2, RRR without gallop, rub or murmur,  Respiratory:  Good breath sounds bilaterally, CTAB with normal respiratory effort Gastrointestinal: normal bowel sounds, soft, non-tender, no noted masses. No HSM MSK: Joints are without erythema or swelling.  Skin:  Warm, no rashes Neurologic:    Mental status is normal.  Gross motor and sensory exams are normal. Stable gait. No tremor

## 2023-07-11 NOTE — Progress Notes (Signed)
See mychart note Dear Mr. Michael Garcia, All of your lab test results look great.  No changes are needed.  I am glad you are doing well. Sincerely, Dr. Mardelle Matte

## 2023-07-12 DIAGNOSIS — F332 Major depressive disorder, recurrent severe without psychotic features: Secondary | ICD-10-CM | POA: Diagnosis not present

## 2023-07-17 DIAGNOSIS — F332 Major depressive disorder, recurrent severe without psychotic features: Secondary | ICD-10-CM | POA: Diagnosis not present

## 2023-07-19 DIAGNOSIS — F332 Major depressive disorder, recurrent severe without psychotic features: Secondary | ICD-10-CM | POA: Diagnosis not present

## 2023-07-24 DIAGNOSIS — F332 Major depressive disorder, recurrent severe without psychotic features: Secondary | ICD-10-CM | POA: Diagnosis not present

## 2023-07-26 DIAGNOSIS — F332 Major depressive disorder, recurrent severe without psychotic features: Secondary | ICD-10-CM | POA: Diagnosis not present

## 2023-07-31 DIAGNOSIS — F332 Major depressive disorder, recurrent severe without psychotic features: Secondary | ICD-10-CM | POA: Diagnosis not present

## 2023-08-02 DIAGNOSIS — F332 Major depressive disorder, recurrent severe without psychotic features: Secondary | ICD-10-CM | POA: Diagnosis not present

## 2023-08-05 NOTE — Progress Notes (Unsigned)
PATIENT: Michael Garcia. DOB: [birth year]  REASON FOR VISIT: follow up HISTORY FROM: patient Primary neurologist: Dr. Anne Hahn  No chief complaint on file.    HISTORY OF PRESENT ILLNESS: Today 08/05/23:  Michael Magnani. is a 67 y.o. male with a history of Migraine headaches and Paresthesia. Returns today for follow-up.      01/01/23: Michael Bardon. is a 67 y.o. male with a history of migraine headache and paresthesia.. Returns today for follow-up. Migraine frequency is the same. Has about 6-7 migraines a month. Naratriptan works well. The severity is better. Continues with Emgality.  He feels that [employer] and Ajovy works very similar.  Wants to discuss memory issues.  Reports that he has noticed trouble with word finding for several years.  Reports that he will transpose numbers or words as if he is dyslexic. 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 With road signs he feels that it takes his brain longer to process.  At home he is able to complete all ADLs independently.  He manages his own medications finances.  He reports that sometimes he has trouble remembering appointments.  Reports that Latuda was recently added to his medication regimen.  MRI of the brain with and without contrast Apr 03, 2018 IMPRESSION: Normal brain MRI.  06/20/22: Michael Garcia is a 67 year old male is a migraine headache and paresthesia. Returns today for follow-up. Reports that he is having 6 headaches a month. Reports that he has 2-3 back to back and then will go away for a while. This is new for him. Reports headaches used to be on the right side but now having some on the left side.  He is currently happy with using Emgality.  He does not wish to change that medication.  He reports that he did go off of gabapentin due to gait abnormality.  He has been doing physical therapy and his gait has improved.  He continues to have paresthesias and would like to restart  gabapentin at a lower dose.  06/15/21:Michael Garcia is a 67 year old male with a history of migraine headaches and paresthesias in the lower extremities.  He returns today for follow-up.  The patient states that he has approximately 6-7 breakthrough headaches a month.  He is currently on Ajovy and Maxalt.  He states that headaches always occur on the right side.  Usually coming up the back of the head and into the temporal region.  He reports that Maxalt typically works well for him.  He is open to trying another medication if it would give him better benefit.  Patient reports that Dr. Alvester Morin increase his gabapentin to 300 mg 3 times a day.  He typically takes 600 in the morning and 300 at bedtime.  He notices midafternoon he has more discomfort in the legs.  He reports sharp shooting electric-like pain in the legs.  He reports that he had an exacerbation of his back pain and Dr. Alvester Morin had to prescribe prednisone which offered him great benefit.   12/06/20: Michael Garcia is a 67 year old male with a history of migraine headaches.  He returns today for follow-up.  He is currently on Ajovy and Maxalt.  Reports that his headaches remain under good control.  States that he has approximately 7-8 migraine headaches a month.  He states the intensity and the longevity of the headache has improved.  He states that 90% of the time he takes Maxalt his headache will resolve in  1 to 2 hours.  Patient reports that since he stopped gabapentin he has noticed more discomfort in the feet.  He would like to restart gabapentin.  Denies any changes with his gait or balance.  He has done 2 rounds of physical therapy and thinks his balance has improved.  He does not use an assistive device when ambulating.    06/02/20: Michael Garcia is a 67 year old male with a history of migraine headaches.  He returns today for follow-up.  He is currently on Emgality and Maxalt.  He reports that he has a headache almost every other day.  The  severity of his headaches vary.  He states that he can use Maxalt and that usually offer some benefit.  Typically uses at least 9 tablets a month.  He also uses ice and heat therapy.  He also has a compounded cream that he rubs on the back of his neck.  In the past he has been on Depakote and gabapentin.  Both of these medications offer good benefit with his headaches but he began having tremor and gait abnormalities.  He returns today for an evaluation.  HISTORY (copied from Dr. Clarisa Kindred note)  Mr. Hedrick is a 67 year old right-handed white male with a history of migraine headaches.  He also has a history of a schwannoma resection from the conus medullaris, he is followed through [facility] for this.  He has migraine headaches mainly in the left occipital distribution, the combination of Emgality, Depakote, and gabapentin significantly reduced the headache frequency, he was only having about 2 headaches a month on this medication regimen.  However, the patient developed significant gait instability on the gabapentin and severe tremors of the upper extremities on Depakote.  He was forced to come off these medications and subsequently his headaches have worsened.  He believes that [employer] worked better [employer] that he is on currently.  He is now having 8 or 9 headache days a month, he may miss work on occasion because of this.  He is having ongoing problems with left leg discomfort secondary to the schwannoma residual.  He will be having MRI of the lumbar spine in the next month.  He has not had any further falls, his handwriting has gotten much better.  He returns for an evaluation.  Maxalt is effective in controlling the headache if he takes it early.  REVIEW OF SYSTEMS: Out of a complete 14 system review of symptoms, the patient complains only of the following symptoms, and all other reviewed systems are negative.  See HPI  ALLERGIES: Allergies  Allergen Reactions   Bacitracin Itching and  Rash   Iodine Itching and Rash    IV Iodine    Sulfamethoxazole-Trimethoprim Rash    HOME MEDICATIONS: Outpatient Medications Prior to Visit  Medication Sig Dispense Refill   alfuzosin (UROXATRAL) 10 MG 24 hr tablet TAKE 1 TABLET(10 MG) BY MOUTH DAILY WITH BREAKFAST 90 tablet 0   ALPRAZolam (XANAX) 1 MG tablet Take 1 mg by mouth as needed. Take 1/2 tablet twice daily and 2 tablets at bedtime.  2   Atogepant (QULIPTA) 30 MG TABS Take 1 tablet (30 mg total) by mouth daily. 90 tablet 1   buPROPion (WELLBUTRIN XL) 300 MG 24 hr tablet Take 450 mg by mouth daily.     cetirizine (ZYRTEC) 10 MG tablet Take 1 tablet by mouth daily.     esomeprazole (NEXIUM) 40 MG capsule TAKE 1 CAPSULE(40 MG) BY MOUTH DAILY 30 capsule 0  FIBER PO Take by mouth.     fluticasone (FLONASE) 50 MCG/ACT nasal spray Place 1 spray into both nostrils daily. 16 g 11   gabapentin (NEURONTIN) 100 MG capsule Take 2 capsules (200 mg total) by mouth 2 (two) times daily. 360 capsule 3   Glucosamine HCl (GLUCOSAMINE PO) Take by mouth.     lamoTRIgine (LAMICTAL) 150 MG tablet Take 200 mg by mouth 2 (two) times daily.     Lurasidone HCl (LATUDA PO) Take 40 mg by mouth at bedtime.     Multiple Vitamin (MULTIVITAMIN PO) Take by mouth.     MULTIPLE VITAMIN PO Take by mouth.     naratriptan (AMERGE) 2.5 MG tablet Take 1 tablet (2.5 mg total) by mouth as needed for migraine. Take one (1) tablet at onset of headache; if returns or does not resolve, may repeat after 4 hours; do not exceed five (5) mg in 24 hours. 10 tablet 11   rizatriptan (MAXALT) 10 MG tablet TAKE 1 TABLET BY MOUTH AT ONSET OF MIGRAINE. REPEAT IN 2 HOURS AS NEEDED. ONLY 2 TABLET/ 24 HOURS 10 tablet 11   rosuvastatin (CRESTOR) 10 MG tablet TAKE 1 TABLET(10 MG) BY MOUTH DAILY 90 tablet 3   tacrolimus (PROTOPIC) 0.1 % ointment in the morning and at bedtime.     traZODone (DESYREL) 150 MG tablet Take 150 mg by mouth at bedtime.     triamcinolone cream (KENALOG) 0.1 %       No facility-administered medications prior to visit.    PAST MEDICAL HISTORY: Past Medical History:  Diagnosis Date   Anxiety    Benign non-nodular prostatic hyperplasia with lower urinary tract symptoms 11/21/2017   Bipolar 1 disorder, depressed (HCC) 07/25/2020   Cervical spondylosis without myelopathy 04/29/2014   Cervical stenosis of spinal canal 11/12/2008   Chronic headaches    Chronic myofascial pain 11/21/2017   Colon polyps 07/25/2020   Colonoscopy 04/2020, [place], benign, recall in 5 years   Degenerative disc disease, cervical 04/29/2014   Erectile disorder, acquired, generalized, severe 11/21/2017   GAD (generalized anxiety disorder) 03/29/2016   Gastro-esophageal reflux disease with esophagitis 11/21/2017   Glaucoma 11/21/2017   Incomplete right bundle branch block 10/18/2016   Insomnia, persistent 03/29/2016   Lichen planus, penis 11/21/2017   Nondependent alcohol abuse, in remission 03/29/2016   Nonintractable migraine, unspecified migraine type 11/21/2017   Peyronie disease 11/12/2016   Status post cervical spinal fusion 04/29/2014   Thyroid nodule 11/21/2017   benign by FNA 04/2020    PAST SURGICAL HISTORY: Past Surgical History:  Procedure Laterality Date   CERVICAL SPINE SURGERY  2010   c6-c7 fusion   HERNIA REPAIR      FAMILY HISTORY: Family History  Problem Relation Age of Onset   Heart disease Mother    Irritable bowel syndrome Mother    Migraines Mother    Diabetes Sister    Neuropathy Daughter    Migraines Daughter    Healthy Son    Alzheimer's disease Neg Hx    Dementia Neg Hx     SOCIAL HISTORY: Social History   Socioeconomic History   Marital status: Married    Spouse name: Not on file   Number of children: 2   Years of education: Not on file   Highest education level: Doctorate  Occupational History   Occupation: Conservation officer, nature in [place]  Tobacco Use   Smoking status: Former    Current packs/day: 0.00    Types: Cigarettes  Quit date: 63    Years since quitting: 30.7   Smokeless tobacco: Never  Vaping Use   Vaping status: Never Used  Substance and Sexual Activity   Alcohol use: No   Drug use: No   Sexual activity: Yes  Other Topics Concern   Not on file  Social History Narrative   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 Identifies as gay, came out at age 18; remains married to wife.    Social Determinants of Health   Financial Resource Strain: Low Risk  (04/19/2023)   Overall Financial Resource Strain (CARDIA)    Difficulty of Paying Living Expenses: Not hard at all  Food Insecurity: No Food Insecurity (04/19/2023)   Hunger Vital Sign    Worried About Running Out of Food in the Last Year: Never true    Ran Out of Food in the Last Year: Never true  Transportation Needs: No Transportation Needs (04/19/2023)   PRAPARE - Administrator, Civil Service (Medical): No    Lack of Transportation (Non-Medical): No  Physical Activity: Insufficiently Active (04/19/2023)   Exercise Vital Sign    Days of Exercise per Week: 2 days    Minutes of Exercise per Session: 20 min  Stress: Stress Concern Present (04/19/2023)   [employer] of Occupational Health - Occupational Stress Questionnaire    Feeling of Stress : Rather much  Social Connections: Moderately Isolated (04/19/2023)   Social Connection and Isolation Panel [NHANES]    Frequency of Communication with Friends and Family: Once a week    Frequency of Social Gatherings with Friends and Family: Once a week    Attends Religious Services: More than 4 times per year    Active Member of [employer] or Organizations: No    Attends Engineer, structural: Not on file    Marital Status: Married  Catering manager Violence: Not on file      PHYSICAL EXAM  There were no vitals filed for this visit.   There is no height or weight on file to calculate BMI.     01/01/2023    8:19 AM 01/01/2023    8:18 AM  Montreal Cognitive Assessment    Visuospatial/ Executive (0/5) 3 3  Naming (0/3) 3   Attention: Read list of digits (0/2) 1   Attention: Read list of letters (0/1) 1   Attention: Serial 7 subtraction starting at 100 (0/3) 3   Language: Repeat phrase (0/2) 2   Language : Fluency (0/1) 1   Abstraction (0/2) 2   Delayed Recall (0/5) 5   Orientation (0/6) 6   Total 27        Generalized: Well developed, in no acute distress   Neurological examination  Mentation: Alert oriented to time, place, history taking. Follows all commands speech and language fluent Cranial nerve II-XII: Pupils were equal round reactive to light. Extraocular movements were full, visual field were full on confrontational test.  Head turning and shoulder shrug  were normal and symmetric. Motor: The motor testing reveals 5 over 5 strength of all 4 extremities. Good symmetric motor tone is noted throughout.  Sensory: Sensory testing is intact to soft touch on all 4 extremities. No evidence of extinction is noted.  Coordination: Cerebellar testing reveals good finger-nose-finger and heel-to-shin bilaterally. Tremor in the upper extremities- action tremor Gait and station: Gait is normal.    DIAGNOSTIC DATA (LABS, IMAGING, TESTING) - I reviewed patient records, labs, notes, testing and imaging myself where available.  Lab Results  Component Value Date   WBC 4.5 07/10/2023   HGB 13.0 07/10/2023   HCT 39.7 07/10/2023   MCV 98.6 07/10/2023   PLT 205.0 07/10/2023      Component Value Date/Time   NA 140 07/10/2023 0849   NA 138 09/14/2019 0929   K 4.6 07/10/2023 0849   CL 101 07/10/2023 0849   CO2 33 (H) 07/10/2023 0849   GLUCOSE 104 (H) 07/10/2023 0849   BUN 7 07/10/2023 0849   BUN 8 09/14/2019 0929   CREATININE 1.17 07/10/2023 0849   CALCIUM 9.6 07/10/2023 0849   PROT 7.3 07/10/2023 0849   PROT 7.8 09/14/2019 0929   ALBUMIN 4.5 07/10/2023 0849   ALBUMIN 5.0 (H) 09/14/2019 0929   AST 15 07/10/2023 0849   ALT 12 07/10/2023 0849    ALKPHOS 64 07/10/2023 0849   BILITOT 0.5 07/10/2023 0849   BILITOT 0.4 09/14/2019 0929   GFRNONAA >60 07/08/2020 1653   GFRAA >60 07/08/2020 1653   Lab Results  Component Value Date   CHOL 118 07/10/2023   HDL 62.40 07/10/2023   LDLCALC 45 07/10/2023   TRIG 57.0 07/10/2023   CHOLHDL 2 07/10/2023   Lab Results  Component Value Date   HGBA1C 5.6 07/10/2023   Lab Results  Component Value Date   VITAMINB12 277 04/18/2018   Lab Results  Component Value Date   TSH 1.71 07/10/2023      ASSESSMENT AND PLAN 67 y.o. year old male  has a past medical history of Anxiety, Benign non-nodular prostatic hyperplasia with lower urinary tract symptoms (11/21/2017), Bipolar 1 disorder, depressed (HCC) (07/25/2020), Cervical spondylosis without myelopathy (04/29/2014), Cervical stenosis of spinal canal (11/12/2008), Chronic headaches, Chronic myofascial pain (11/21/2017), Colon polyps (07/25/2020), Degenerative disc disease, cervical (04/29/2014), Erectile disorder, acquired, generalized, severe (11/21/2017), GAD (generalized anxiety disorder) (03/29/2016), Gastro-esophageal reflux disease with esophagitis (11/21/2017), Glaucoma (11/21/2017), Incomplete right bundle branch block (10/18/2016), Insomnia, persistent (03/29/2016), Lichen planus, penis (11/21/2017), Nondependent alcohol abuse, in remission (03/29/2016), Nonintractable migraine, unspecified migraine type (11/21/2017), Peyronie disease (11/12/2016), Status post cervical spinal fusion (04/29/2014), and Thyroid nodule (11/21/2017). here with :  Migraine headaches  Stop [employer] 30 mg daily for prevention.  I provided the patient with information on his MyChart regarding the medication and potential side effects Continue Amerge for abortive therapy.     Memory disturbance  Referral placed for neuropsychological evaluation Discussed repeating imaging however he deferred until after neuropsychological testing  Advised if his symptoms worsen  or he develops new symptoms he should let us know.  He will follow-up in 6 months or sooner if needed   Butch Penny, MSN, NP-C 08/05/2023, 3:44 PM [facility] [address] [place], [place] [address] [phone]

## 2023-08-06 ENCOUNTER — Ambulatory Visit: Payer: BC Managed Care – PPO | Admitting: Adult Health

## 2023-08-06 ENCOUNTER — Encounter: Payer: Self-pay | Admitting: Adult Health

## 2023-08-06 VITALS — BP 113/69 | HR 64 | Ht 75.0 in | Wt 223.0 lb

## 2023-08-06 DIAGNOSIS — R413 Other amnesia: Secondary | ICD-10-CM

## 2023-08-06 DIAGNOSIS — G43709 Chronic migraine without aura, not intractable, without status migrainosus: Secondary | ICD-10-CM | POA: Diagnosis not present

## 2023-08-06 MED ORDER — QULIPTA 60 MG PO TABS: 60.0000 mg | ORAL_TABLET | Freq: Every day | ORAL | 11 refills | Status: DC

## 2023-08-06 NOTE — Patient Instructions (Addendum)
Your Plan:  Increase Qulipta to 60 mg daily  Continue using Amerge for abortive therapy /If your symptoms worsen or you develop new symptoms please let us know.     Thank you for coming to see [place] at [facility]. I hope we have been able to provide you high quality care today.  You may receive a patient satisfaction survey over the next few weeks. We would appreciate your feedback and comments so that we may continue to improve ourselves and the health of our patients.

## 2023-08-07 ENCOUNTER — Other Ambulatory Visit: Payer: Self-pay | Admitting: Adult Health

## 2023-08-09 DIAGNOSIS — F332 Major depressive disorder, recurrent severe without psychotic features: Secondary | ICD-10-CM | POA: Diagnosis not present

## 2023-08-10 ENCOUNTER — Other Ambulatory Visit: Payer: Self-pay | Admitting: Adult Health

## 2023-08-16 ENCOUNTER — Other Ambulatory Visit: Payer: Self-pay | Admitting: Adult Health

## 2023-08-16 DIAGNOSIS — F332 Major depressive disorder, recurrent severe without psychotic features: Secondary | ICD-10-CM | POA: Diagnosis not present

## 2023-08-20 NOTE — Telephone Encounter (Signed)
Rx refilled per last office visit note.

## 2023-08-22 DIAGNOSIS — F3181 Bipolar II disorder: Secondary | ICD-10-CM | POA: Diagnosis not present

## 2023-08-22 DIAGNOSIS — F4312 Post-traumatic stress disorder, chronic: Secondary | ICD-10-CM | POA: Diagnosis not present

## 2023-08-22 DIAGNOSIS — F4323 Adjustment disorder with mixed anxiety and depressed mood: Secondary | ICD-10-CM | POA: Diagnosis not present

## 2023-08-22 DIAGNOSIS — F331 Major depressive disorder, recurrent, moderate: Secondary | ICD-10-CM | POA: Diagnosis not present

## 2023-08-23 DIAGNOSIS — F332 Major depressive disorder, recurrent severe without psychotic features: Secondary | ICD-10-CM | POA: Diagnosis not present

## 2023-08-26 DIAGNOSIS — F411 Generalized anxiety disorder: Secondary | ICD-10-CM | POA: Diagnosis not present

## 2023-08-26 DIAGNOSIS — F3181 Bipolar II disorder: Secondary | ICD-10-CM | POA: Diagnosis not present

## 2023-08-26 DIAGNOSIS — G4700 Insomnia, unspecified: Secondary | ICD-10-CM | POA: Diagnosis not present

## 2023-08-27 ENCOUNTER — Other Ambulatory Visit (HOSPITAL_COMMUNITY): Payer: Self-pay

## 2023-08-27 DIAGNOSIS — F332 Major depressive disorder, recurrent severe without psychotic features: Secondary | ICD-10-CM | POA: Diagnosis not present

## 2023-08-29 ENCOUNTER — Encounter: Payer: BC Managed Care – PPO | Attending: Psychology | Admitting: Psychology

## 2023-08-29 DIAGNOSIS — F411 Generalized anxiety disorder: Secondary | ICD-10-CM | POA: Diagnosis not present

## 2023-08-29 DIAGNOSIS — R41840 Attention and concentration deficit: Secondary | ICD-10-CM | POA: Diagnosis not present

## 2023-08-29 DIAGNOSIS — F4323 Adjustment disorder with mixed anxiety and depressed mood: Secondary | ICD-10-CM | POA: Diagnosis not present

## 2023-08-29 DIAGNOSIS — F902 Attention-deficit hyperactivity disorder, combined type: Secondary | ICD-10-CM | POA: Diagnosis not present

## 2023-08-29 DIAGNOSIS — F3181 Bipolar II disorder: Secondary | ICD-10-CM

## 2023-08-29 DIAGNOSIS — G4700 Insomnia, unspecified: Secondary | ICD-10-CM

## 2023-08-29 DIAGNOSIS — F4312 Post-traumatic stress disorder, chronic: Secondary | ICD-10-CM | POA: Diagnosis not present

## 2023-08-29 DIAGNOSIS — R413 Other amnesia: Secondary | ICD-10-CM | POA: Insufficient documentation

## 2023-08-30 DIAGNOSIS — F332 Major depressive disorder, recurrent severe without psychotic features: Secondary | ICD-10-CM | POA: Diagnosis not present

## 2023-09-04 DIAGNOSIS — R413 Other amnesia: Secondary | ICD-10-CM

## 2023-09-05 NOTE — Progress Notes (Signed)
Behavioral Observations:  The patient appeared well-groomed and appropriately dressed. His manners were polite and appropriate to the situation. The patient's affect was anxious and he became frustrated during the [facility] portion of the WMS-IV. His overall attitude towards testing was positive and his effort was good.   Neuropsychology Note  Michael Garcia. completed 135 minutes of neuropsychological testing with technician, Staci Acosta, BA, under the supervision of Arley Phenix, PsyD., Clinical Neuropsychologist. The patient did not appear overtly distressed by the testing session, per behavioral observation or via self-report to the technician. Rest breaks were offered.   Clinical Decision Making: In considering the patient's current level of functioning, level of presumed impairment, nature of symptoms, emotional and behavioral responses during clinical interview, level of literacy, and observed level of motivation/effort, a battery of tests was selected by Dr. Kieth Brightly during initial consultation on 08/29/2023. This was communicated to the technician. Communication between the neuropsychologist and technician was ongoing throughout the testing session and changes were made as deemed necessary based on patient performance on testing, technician observations and additional pertinent factors such as those listed above.  Tests Administered: Trail Making Test (TMT; Part A & B) Wechsler Adult Intelligence Scale, 4th Edition (WAIS-IV) Wechsler Memory Scale, 4th Edition (WMS-IV); Adult Battery   Results:  TMT:  Part A time= 51s Errors=0  Part B time= 78s Errors=0  WAIS-IV:  Composite Score Summary  Scale Sum of Scaled Scores Composite Score Percentile Rank 95% Conf. Interval Qualitative Description  Verbal Comprehension 35 VCI 108 70 102-113 Average  Perceptual Reasoning 24 PRI 88 21 82-95 Low Average  Working Memory 17 WMI 92 30 86-99 Average   Processing Speed 20 PSI 100 50 92-108 Average  Full Scale 96 FSIQ 97 42 93-101 Average  General Ability 59 GAI 99 47 94-104 Average   Verbal Comprehension Subtests Summary  Subtest Raw Score Scaled Score Percentile Rank Reference Group Scaled Score SEM  Similarities 27 11 63 11 1.04  Vocabulary 43 11 63 12 0.67  Information 20 13 84 14 0.73  The scaled scores in the Reference Group Scaled Score column are based on the performance of examinees aged 20:0-34:11 (i.e., the reference group). See Chapter 6 of the WAIS-IV Technical and Interpretive Manual for more information.  Perceptual Reasoning Subtests Summary  Subtest Raw Score Scaled Score Percentile Rank Reference Group Scaled Score SEM  Block Design 28 9 37 6 1.08  Matrix Reasoning 9 7 16 4  0.90  Visual Puzzles 9 8 25 6  0.85   Working Librarian, academic Raw Score Scaled Score Percentile Rank Reference Group Scaled Score SEM  Digit Span 22 8 25 7  0.79  Arithmetic 12 9 37 9 0.99   Processing Speed Subtests Summary  Subtest Raw Score Scaled Score Percentile Rank Reference Group Scaled Score SEM  Symbol Search 23 9 37 6 1.31  Coding 60 11 63 8 0.99    WMS-IV:  Index Score Summary  Index Sum of Scaled Scores Index Score Percentile Rank 95% Confidence Interval Qualitative Descriptor  Auditory Memory (AMI) 35 93 32 87-100 Average  Visual Memory (VMI) 39 98 45 92-104 Average  Visual Working Memory (VWMI) 26 117 87 109-123 High Average  Immediate Memory (IMI) 41 102 55 96-108 Average  Delayed Memory (DMI) 33 88 21 82-95 Low Average      Primary Subtest Scaled Score Summary  Subtest Domain Raw Score Scaled Score Percentile Rank  Logical Memory I AM 20 8 25   Logical  Memory II AM 20 10 50  Verbal [facility] I AM 19 8 25   [facility] II AM 7 9 37  Designs I VM 67 11 63  Designs II VM 37 7 16  Visual Reproduction I VM 40 14 91  Visual Reproduction II VM 11 7 16   Spatial Addition VWM 17 15 95   Symbol Span VWM 22 11 63   Auditory Memory Process Score Summary  Process Score Raw Score Scaled Score Percentile Rank Cumulative Percentage (Base Rate)  LM II Recognition 21 - - 17-25%  VPA II Recognition 38 - - 51-75%   Visual Memory Process Score Summary  Process Score Raw Score Scaled Score Percentile Rank Cumulative Percentage (Base Rate)  DE I Content 34 10 50 -  DE I Spatial 17 11 63 -  DE II Content 27 8 25  -  DE II Spatial 8 7 16  -  DE II Recognition 8 - - 3-9%  VR II Recognition 4 - - 26-50%     ABILITY-MEMORY ANALYSIS  Ability Score:  VCI: 108 Date of Testing:  WAIS-IV; WMS-IV 2023/09/04  Predicted Difference Method   Index Predicted WMS-IV Index Score Actual WMS-IV Index Score Difference Critical Value  Significant Difference Y/N Base Rate  Auditory Memory 104 93 11 10.15 Y 20%  Visual Memory 104 98 6 9.30 N   Visual Working Memory 104 117 -13 11.68 Y 15%  Immediate Memory 105 102 3 11.15 N   Delayed Memory 104 88 16 11.49 Y 10-15%  Statistical significance (critical value) at the .01 level.    Feedback to Patient: Michael Garcia. will return on 10/17/2023 for an interactive feedback session with Dr. Kieth Brightly at which time his test performances, clinical impressions and treatment recommendations will be reviewed in detail. The patient understands he can contact our office should he require our assistance before this time.  135 minutes spent face-to-face with patient administering standardized tests, 30 minutes spent scoring Radiographer, therapeutic). [CPT [id number], [id number]]  Full report to follow.

## 2023-09-06 DIAGNOSIS — F332 Major depressive disorder, recurrent severe without psychotic features: Secondary | ICD-10-CM | POA: Diagnosis not present

## 2023-09-08 ENCOUNTER — Encounter: Payer: Self-pay | Admitting: Family Medicine

## 2023-09-10 DIAGNOSIS — F331 Major depressive disorder, recurrent, moderate: Secondary | ICD-10-CM | POA: Diagnosis not present

## 2023-09-10 DIAGNOSIS — F902 Attention-deficit hyperactivity disorder, combined type: Secondary | ICD-10-CM | POA: Diagnosis not present

## 2023-09-10 DIAGNOSIS — F4323 Adjustment disorder with mixed anxiety and depressed mood: Secondary | ICD-10-CM | POA: Diagnosis not present

## 2023-09-10 DIAGNOSIS — F4312 Post-traumatic stress disorder, chronic: Secondary | ICD-10-CM | POA: Diagnosis not present

## 2023-09-11 DIAGNOSIS — R413 Other amnesia: Secondary | ICD-10-CM | POA: Diagnosis not present

## 2023-09-11 NOTE — Progress Notes (Signed)
   Behavioral Observations:  The patient appeared well-groomed and appropriately dressed. His manners were polite and appropriate to the situation. The patient's affect was anxious.  Despite this, his attitude towards testing was positive and his effort was good.   Neuropsychology Note  Michael Garcia. completed 90 minutes of neuropsychological testing with technician, Staci Acosta, BA, under the supervision of Arley Phenix, PsyD., Clinical Neuropsychologist. The patient did not appear overtly distressed by the testing session, per behavioral observation or via self-report to the technician. Rest breaks were offered.   Clinical Decision Making: In considering the patient's current level of functioning, level of presumed impairment, nature of symptoms, emotional and behavioral responses during clinical interview, level of literacy, and observed level of motivation/effort, a battery of tests was selected by Dr. Kieth Brightly during initial consultation on 08/29/2023. This was communicated to the technician. Communication between the neuropsychologist and technician was ongoing throughout the testing session and changes were made as deemed necessary based on patient performance on testing, technician observations and additional pertinent factors such as those listed above.  Tests Administered: Comprehensive Attention Battery (CAB) Continuous Performance Test (CPT) Controlled Oral Word Association Test (COWAT; FAS & Animals)   Results: Will be included in final report   COWAT: FAS total= 43 Z= 0.33 Animals total= 27 Z= 2  Feedback to Patient: Michael Garcia. will return on 10/17/2023 for an interactive feedback session with Dr. Kieth Brightly at which time his test performances, clinical impressions and treatment recommendations will be reviewed in detail. The patient understands he can contact our office should he require our assistance before this time.  90 minutes spent face-to-face  with patient administering standardized tests, 30 minutes spent scoring Radiographer, therapeutic). [CPT [id number], [id number]]  Full report to follow.

## 2023-09-13 DIAGNOSIS — F332 Major depressive disorder, recurrent severe without psychotic features: Secondary | ICD-10-CM | POA: Diagnosis not present

## 2023-09-18 ENCOUNTER — Encounter: Payer: BC Managed Care – PPO | Admitting: Psychology

## 2023-09-19 ENCOUNTER — Encounter: Payer: BC Managed Care – PPO | Attending: Psychology | Admitting: Psychology

## 2023-09-19 DIAGNOSIS — R413 Other amnesia: Secondary | ICD-10-CM | POA: Diagnosis not present

## 2023-09-19 DIAGNOSIS — F411 Generalized anxiety disorder: Secondary | ICD-10-CM | POA: Diagnosis not present

## 2023-09-19 DIAGNOSIS — G4700 Insomnia, unspecified: Secondary | ICD-10-CM | POA: Diagnosis not present

## 2023-09-19 DIAGNOSIS — F3181 Bipolar II disorder: Secondary | ICD-10-CM | POA: Diagnosis not present

## 2023-09-20 DIAGNOSIS — F332 Major depressive disorder, recurrent severe without psychotic features: Secondary | ICD-10-CM | POA: Diagnosis not present

## 2023-09-27 ENCOUNTER — Other Ambulatory Visit (HOSPITAL_COMMUNITY): Payer: Self-pay

## 2023-09-27 DIAGNOSIS — F332 Major depressive disorder, recurrent severe without psychotic features: Secondary | ICD-10-CM | POA: Diagnosis not present

## 2023-10-01 DIAGNOSIS — F411 Generalized anxiety disorder: Secondary | ICD-10-CM | POA: Diagnosis not present

## 2023-10-01 DIAGNOSIS — F3132 Bipolar disorder, current episode depressed, moderate: Secondary | ICD-10-CM | POA: Diagnosis not present

## 2023-10-01 DIAGNOSIS — G4700 Insomnia, unspecified: Secondary | ICD-10-CM | POA: Diagnosis not present

## 2023-10-04 DIAGNOSIS — F332 Major depressive disorder, recurrent severe without psychotic features: Secondary | ICD-10-CM | POA: Diagnosis not present

## 2023-10-06 ENCOUNTER — Encounter: Payer: Self-pay | Admitting: Family Medicine

## 2023-10-07 ENCOUNTER — Other Ambulatory Visit: Payer: Self-pay

## 2023-10-07 DIAGNOSIS — F4312 Post-traumatic stress disorder, chronic: Secondary | ICD-10-CM | POA: Diagnosis not present

## 2023-10-07 DIAGNOSIS — F4323 Adjustment disorder with mixed anxiety and depressed mood: Secondary | ICD-10-CM | POA: Diagnosis not present

## 2023-10-07 DIAGNOSIS — F411 Generalized anxiety disorder: Secondary | ICD-10-CM | POA: Diagnosis not present

## 2023-10-07 MED ORDER — ROSUVASTATIN CALCIUM 10 MG PO TABS: 10.0000 mg | ORAL_TABLET | Freq: Every day | ORAL | 3 refills | Status: DC

## 2023-10-08 DIAGNOSIS — N5201 Erectile dysfunction due to arterial insufficiency: Secondary | ICD-10-CM | POA: Diagnosis not present

## 2023-10-08 DIAGNOSIS — Z125 Encounter for screening for malignant neoplasm of prostate: Secondary | ICD-10-CM | POA: Diagnosis not present

## 2023-10-08 DIAGNOSIS — Z960 Presence of urogenital implants: Secondary | ICD-10-CM | POA: Diagnosis not present

## 2023-10-08 DIAGNOSIS — N401 Enlarged prostate with lower urinary tract symptoms: Secondary | ICD-10-CM | POA: Diagnosis not present

## 2023-10-08 DIAGNOSIS — N320 Bladder-neck obstruction: Secondary | ICD-10-CM | POA: Diagnosis not present

## 2023-10-11 DIAGNOSIS — F332 Major depressive disorder, recurrent severe without psychotic features: Secondary | ICD-10-CM | POA: Diagnosis not present

## 2023-10-15 ENCOUNTER — Telehealth: Payer: Self-pay | Admitting: Physical Medicine and Rehabilitation

## 2023-10-15 NOTE — Telephone Encounter (Signed)
Pt called requesting an appt for back [email or web address] Please call pt at [phone]. Last 05/2022

## 2023-10-16 ENCOUNTER — Other Ambulatory Visit: Payer: Self-pay | Admitting: Physical Medicine and Rehabilitation

## 2023-10-16 DIAGNOSIS — M5442 Lumbago with sciatica, left side: Secondary | ICD-10-CM | POA: Diagnosis not present

## 2023-10-16 DIAGNOSIS — M47892 Other spondylosis, cervical region: Secondary | ICD-10-CM | POA: Diagnosis not present

## 2023-10-16 DIAGNOSIS — M5416 Radiculopathy, lumbar region: Secondary | ICD-10-CM

## 2023-10-17 ENCOUNTER — Encounter: Payer: Self-pay | Admitting: Psychology

## 2023-10-17 ENCOUNTER — Encounter: Payer: BC Managed Care – PPO | Attending: Psychology | Admitting: Psychology

## 2023-10-17 ENCOUNTER — Encounter: Payer: BC Managed Care – PPO | Admitting: Psychology

## 2023-10-17 DIAGNOSIS — G4700 Insomnia, unspecified: Secondary | ICD-10-CM

## 2023-10-17 DIAGNOSIS — F411 Generalized anxiety disorder: Secondary | ICD-10-CM | POA: Diagnosis not present

## 2023-10-17 DIAGNOSIS — F3181 Bipolar II disorder: Secondary | ICD-10-CM

## 2023-10-17 DIAGNOSIS — R413 Other amnesia: Secondary | ICD-10-CM | POA: Insufficient documentation

## 2023-10-17 NOTE — Progress Notes (Signed)
Neuropsychological Evaluation   Patient:  Elisandro Wyka.   DOB: [birth year]  MR Number: [id number]  Location: [facility] DEPT OF [facility] [address] [place], [place] [address] [place] [address] Dept: [phone]  Start: 3 PM End: 4 PM  Provider/Observer:     Hershal Coria PsyD  Chief Complaint:      Chief Complaint  Patient presents with   Memory Loss   Anxiety   Depression   Neck Pain   Migraine   Other    Attention and concentration difficulties Word finding difficulties    Reason For Service:      Isaias Bozard. is a 67 year old male referred for neuropsychological evaluation by Butch Penny, NP, who has been following the patient since 2021.  The patient was initially seen by Stephanie Acre, MD.  The reason for initially being seen by neurology was due to recurrent headaches that were initially diagnosed as intractable periodic headache syndrome and now carries a diagnosis of chronic migraine without aura without status migrainous not intractable.  During these headaches, the patient gets paresthesia as well.  Patient most recently has about 6 or 7 migraines a month and the severity have been improving with medication interventions.  Patient has noted increasing difficulties with word finding for several years and describes difficulties are issues transposing numbers and words as if he is "dyslexic".  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  Patient has noted increased time for processing information.  Patient is managing all ADLs and IADLs.  Patient with a past psychiatric history including diagnosis of generalized anxiety disorder, bipolar disorder (Latuda and Wellbutrin along with alprazolam) and insomnia (trazodone for sleep) along with significant cervical pain with history of spinal fusion.    During initial clinical interview, the patient reported that he would like to create a baseline to establish current status and consideration of what may be going on with him cognitively.  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  Patient describes short-term memory loss is minor but describes forgetfulness, attentional lapses that interrupt completion of tasks, occasional confusion, episodes of difficulty spelling and (brain fog.).  Patient notes that sometimes when his short-term memory is problematic that he has difficulty focusing on 1 thing at a time.  Patient reports that he has had attentional issues for more than a decade with the memory changes more recently.  Patient reports that he was never great with his memory but was able to manage his life.  Patient notes that he has had a history of significant depressive events and dealt with significant depression in college.  Patient reports that he was hyperactive "ADHD" as a child and experiences lots of stressors and trauma as a child.  Patient notes childhood history of depression and behavioral issues, hyperactivity.  Patient endorses at least limited hypomanic episodes that included episodes of increased anger and agitation.   Patient reports that he had a significant fall when he was a preteen suffering retrograde and anterograde amnesia.  Patient describes this as a fall from 10 to 12 feet hitting his head.  The patient reported that he broke his elbows during this fall.  Patient has had C6-C7 fusion in 2019 as well as previous  surgery for intradural schwannoma, experiences chronic shoulder and lower back pain.  Patient notes injuries from playing basketball and chronic pain currently from arthritic process.  Patient also has mild left leg and torso nerve damage.  Left-sided balance issues with occasional weakness are also noted.    Medical History:                         Past Medical History:  Diagnosis Date   Anxiety     Benign non-nodular prostatic hyperplasia with lower urinary tract symptoms 11/21/2017   Bipolar 1 disorder, depressed (HCC) 07/25/2020   Cervical spondylosis without myelopathy 04/29/2014   Cervical stenosis of spinal canal 11/12/2008   Chronic headaches     Chronic myofascial pain 11/21/2017   Colon polyps 07/25/2020    [place]oscopy 04/2020, [place], benign, recall in 5 years   Degenerative disc disease, cervical 04/29/2014   Erectile disorder, acquired, generalized, severe 11/21/2017   GAD (generalized anxiety disorder) 03/29/2016   Gastro-esophageal reflux disease with esophagitis 11/21/2017   Glaucoma 11/21/2017   Incomplete right bundle branch block 10/18/2016   Insomnia, persistent 03/29/2016   Lichen planus, penis 11/21/2017   Nondependent alcohol abuse, in remission 03/29/2016   Nonintractable migraine, unspecified migraine type 11/21/2017   Peyronie disease 11/12/2016   Status post cervical spinal fusion 04/29/2014   Thyroid nodule 11/21/2017    benign by FNA 04/2020                                                               Patient Active Problem List    Diagnosis Date Noted   Mixed hyperlipidemia 01/15/2022   Prediabetes 01/15/2022   Bipolar 2 disorder (HCC) 07/25/2020   Colon polyps 07/25/2020   Stricture of anterior urethra in male 09/11/2018   Schwannoma of spinal cord (HCC) 04/22/2018   Benign non-nodular prostatic hyperplasia with lower urinary tract symptoms 11/21/2017   Chronic constipation 11/21/2017   Erectile dysfunction due to arterial insufficiency 11/21/2017   Gastritis 11/21/2017   Lichen planus 11/21/2017   Migraine headache 11/21/2017   Nontoxic thyroid nodule 11/21/2017   Glaucoma 11/21/2017   Chronic myofascial pain 11/21/2017   Peyronie's disease 11/12/2016   Incomplete right bundle branch block 10/18/2016   GAD (generalized anxiety disorder) 03/29/2016    Insomnia, persistent 03/29/2016   Nondependent alcohol abuse, in remission 03/29/2016   Cervical spondylosis without myelopathy 04/29/2014   Status post cervical spinal fusion 04/29/2014   Cervical stenosis of spinal canal 11/12/2008      Onset and Duration of Symptoms: Patient notes lifelong history of attentional issues and behavioral issues that go back to childhood and has been diagnosed with bipolar disorder and previously diagnosed with both bipolar 1 and bipolar 2 at various times.  Patient with significant anxiety and insomnia as well.  Patient notes more short-term memory issues and difficulty completing task recently.  Patient with long-term insomnia.  Patient has had hypomanic episodes as well as significant depressive episodes.  More recently patient notes more issues with short-term memory issues and difficulty completing tasks and more errors in completing work with word finding difficulties, transposing numbers and letters more frequently.   Progression of Symptoms: Patient notes more recent changes with regard to short-term memory issues.   Additional  Tests and Measures from other records:   Neuroimaging Results: Patient had MRI brain with and without contrast conducted on 04/03/2018.  There was no indication of acute abnormality.  White matter signal was normal for age and no atrophy noted.  There was no indication of chronic microhemorrhages or other abnormalities and this was felt to be a normal brain MRI.   Laboratory Tests: Patient has had regular blood workl and overall, blood work and other laboratory studies have been within normal limits.  This includes vitamin B12 and vitamin D studies as well as thyroid studies.   Sleep: Patient scribes lifelong issues with sleep particularly issues with insomnia and takes alprazolam and trazodone to help with sleep and addresses anxiety symptoms.   Diet Pattern: Patient describes an inconsistent appetite and inconsistent eating  pattern.  Tests Administered: Trail Making Test (TMT; Part A & B) Wechsler Adult Intelligence Scale, 4th Edition (WAIS-IV) Wechsler Memory Scale, 4th Edition (WMS-IV); Adult Battery Comprehensive Attention Battery (CAB) Continuous Performance Test (CPT) Controlled Oral Word Association Test (COWAT; FAS & Animals)   Participation Level:   Active  Participation Quality:  Appropriate and Inattentive and anxious at times    Behavioral Observation:    09/04/2023:The patient appeared well-groomed and appropriately dressed. His manners were polite and appropriate to the situation. The patient's affect was anxious and he became frustrated during the Verbal Paired Associates portion of the WMS-IV. His overall attitude towards testing was positive and his effort was good.   09/11/2023:  The patient appeared well-groomed and appropriately dressed. His manners were polite and appropriate to the situation. The patient's affect was anxious. Despite this, his attitude towards testing was positive and his effort was good.   Well Groomed, Alert, and Appropriate.   Test Results:   Initially, an estimation was made as to the patient's premorbid intellectual and cognitive functioning to provide comparison points to current objective neuropsychological test data.  The patient graduated from the [employer] and completed his doctorate in [place] attending [address]  The patient was active in sports and school including playing basketball.  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  Patient also has worked as a Programmer, applications previously.  It is conservatively estimated that the patient's premorbid intellectual and cognitive abilities were likely in the high average range globally although individual cognitive domains may have been higher.  We will utilize a  comparison point of roughly 1 standard deviation above normative expectations for measures that involve relationship to educational based and abilities based measures.  Secondly, an estimation was made as to the validity of the current assessment procedures and scores.  While the patient was rather anxious at times and concerned about good performance and performing well the patient maintained a positive attitude towards the testing and displayed good effort throughout.  Embedded validity test/checks were all within normal limits and no indication of purposeful attempts to exaggerate or Frain any type of difficulties.  There was, however, considerable apprehension and engaging with touch screen interface which was pressure sensitive on the comprehensive attention battery.  Excessive numbers of errors of omission on a number of tests likely represent tentative responding rather than the degree of inattention that it might suggest.  When measures that were similar in nature utilized a keyboard instead of a touch screen the patient showed nearly 0 errors of omission and therefore  this appears to be the case.  Caution will be made interpreting some of the subtest of the comprehensive attention battery.  Initially, the patient was administered the comprehensive attention battery.  On the pure auditory and visual reaction time measures the patient had very good accuracy scores for correct responses but had significant elevations in errors of omission on both the visual and auditory.  However, in-depth analysis of the overall test battery would suggest that this simply reflects hesitancy to firmly respond to the stimuli as the touch screen was a pressure sensitive user interface rather than electro static such as a common cell phone.  The patient had no errors of commission noted.  His average response times for both auditory and visual reaction times when he did respond were well within normative expectations.  A  similar pattern was noted with errors of omission on the discriminate reaction time test on the the auditory, visual and shift discriminant reaction time measures.  I will not over interpret this information.  Average response times when he did have a recorded response were well within normal limits.  There were no indications of errors of commission.  Similar pattern was noted on the visual/plan reaction time measure.  The patient did very well on the visual encoding measures and had some initial difficulty with the auditory encoding measures.  However, on similar measures from the Wechsler Adult Intelligence Scale we did not see this and therefore think it reflects his apprehension on the touch screen user interface during this measure.  There do not appear to be any deficits with regard to auditory or visual encoding.  Finally, the patient was administered the comprehensive attention battery visual monitor CPT measure.  This is a 15-minute task that essentially is a visual discriminate reaction time measure that is broken down into five 3-minute blocks of time for analysis.  Rather than using a touch screen interface a keyboard interface is utilized.  The patient did very well on this measure throughout showing an average of 1 error of commission during each block of time and no errors of omission throughout the entirety of the measure.  During the first 3 minutes of this measure the patient average 378 ms for accurate responses and showed very little change over the 15 minutes of the task.  In fact, he showed minimal increase in response times and by the end of the measure his average response time was 412 ms which is less than a 50 ms change over the 15-minute task.  This pattern would suggest very good capacity to sustain attention and concentration over time, good impulse control, accurate decision making and no lapses of attention.  COWAT: FAS total= 43 Z= 0.33 Animals total= 27 Z= 2  The patient  was then administered the control or word association test.  This measure assesses both lexical fluency and semantic fluency.  On the FAS test which assesses lexical fluency the patient did well with performances in the average range relative to age, gender, and education matched comparison group.  The patient did very well on the animal naming test, which assesses semantic fluency where he was 2 standard deviations above normative expectations.  Overall, there were no indications of any expressive language deficits or objective findings of word finding difficulties.  During the extended face-to-face clinical interview there were also no indications of receptive or expressive language deficits noted.  TMT:  Part A time= 51s Errors=0   Part B time= 78s Errors=0  The patient was  also administered the Trail Making Test part A and B.  On Trail Making Test part A he required 51 seconds to complete the measure with 0 errors.  This performance is well within normative expectations.  On part B which assesses not only focus execute/information processing speed also requires cognitive shifting and mental flexibility.  The patient did well on the Trail Making Test part B requiring 78 seconds to complete the task with no errors.  There were no deficits noted on the Trail Making Test.   WAIS-IV:            Composite Score Summary          Scale Sum of Scaled Scores Composite Score Percentile Rank 95% Conf. Interval Qualitative Description  Verbal Comprehension 35 VCI 108 70 102-113 Average  Perceptual Reasoning 24 PRI 88 21 82-95 Low Average  Working Memory 17 WMI 92 30 86-99 Average  Processing Speed 20 PSI 100 50 92-108 Average  Full Scale 96 FSIQ 97 42 93-101 Average  General Ability 59 GAI 99 47 94-104 Average    The patient was administered the Wechsler Adult Intelligence Scale-IV to provide a thorough assessment of a wide range of cognitive domains and a highly structured well normed collection of  measures.  This was utilized to allow for very standardized administration and potential for test retest procedures in the future as the patient would like part of the usefulness of the current assessment to be utilized as a baseline for future assessments.  As the patient is reporting some cognitive changes more recently, the current score should not be considered reflexive of his lifelong functioning status but more of as a reflection of his current status.  2 composite/Global functioning scales were derived.  The patient produced a full-scale IQ score of 97 which falls at the 42nd percentile and in the average range relative to a normative population.  This is roughly 1 standard deviation below predicted levels of premorbid functioning.  Verbal comprehension and information processing Speed indices were generally consistent premorbid estimations and the patient has shown weaknesses more with visual-spatial and visual processing measures and some relative weakness with regard to auditory encoding measures which brought down his Global scores.          Verbal Comprehension Subtests Summary        Subtest Raw Score Scaled Score Percentile Rank Reference Group Scaled Score SEM  Similarities 27 11 63 11 1.04  Vocabulary 43 11 63 12 0.67  Information 20 13 84 14 0.73   The patient produced a verbal comprehension index score of 108 which falls at the [facility] and is in the average range relative to a normative population.  There was not really significant scatter noted within subtest performance and the patient performed consistent with premorbid estimates on measures of verbal reasoning and problem-solving, vocabulary knowledge and did very well with regard to his general fund of information.  Perceptual Reasoning Subtests Summary        Subtest Raw Score Scaled Score Percentile Rank Reference Group Scaled Score SEM  Block Design 28 9 37 6 1.08  Matrix Reasoning 9 7 16 4  0.90  Visual Puzzles 9 8  25 6  0.85   The patient produced a perceptual reasoning index score of 88 which falls at the 21st percentile and in the low average range relative to a normative population.  There was some scatter noted in subtest performance and the patient performed at the lower end of the average  range to mild impairments noted on individual subtest.  The patient did well on measures of his ability to analyze geometric patterns and perform whole-part recognition skills.  The patient had some very mild relative difficulties especially when compared to premorbid estimates on measures of fluid visual reasoning and visual processing skills.  The patient showed his greatest weakness with regard to visual spatial capacity on measures of nonverbal reasoning skills and perceptual organizational skills.  While these are not significantly impaired they are below predictions of premorbid functioning.  This is an area that if repeat testing is performed should be looked at more closely.  Working Comptroller Raw Score Scaled Score Percentile Rank Reference Group Scaled Score SEM  Digit Span 22 8 25 7  0.79  Arithmetic 12 9 37 9 0.99   The patient produced a working memory index score of 92 which falls at the 30th percentile and is in the average range relative to a normative population.  The patient showed mild weaknesses here relative to premorbid estimates with regard to auditory encoding but he still performed at the lower end of the average range relative to the skills.  The patient showed the capacity to actively process the information that he does initially encode into his auditory Register.  There were no significant deficits but this level of auditory encoding may have some mild impact on his capacity to store and organize new auditory information.  Processing Speed Subtests Summary        Subtest Raw Score Scaled Score Percentile Rank Reference Group Scaled Score SEM  Symbol Search 23 9 37 6  1.31  Coding 60 11 63 8 0.99   The patient produced a processing speed index score of 100 which falls at the 50th percentile and in the average range.  There were no indications of significant weakness with regard to information processing speed, visual scanning or visual searching capacities.  The patient showed good focus/execute attentional capacity.  This is consistent with his performances on the comprehensive attention battery particularly the CPT measures.      WMS-IV:          Index Score Summary        Index Sum of Scaled Scores Index Score Percentile Rank 95% Confidence Interval Qualitative Descriptor  Auditory Memory (AMI) 35 93 32 87-100 Average  Visual Memory (VMI) 39 98 45 92-104 Average  Visual Working Memory (VWMI) 26 117 87 109-123 High Average  Immediate Memory (IMI) 41 102 55 96-108 Average  Delayed Memory (DMI) 33 88 21 82-95 Low Average    The patient was also administered the Wechsler Memory Scale-IV to provide a thorough assessment of a wide range of memory and learning components in a highly structured, well normed and highly adopted memory assessment.  The patient showed mild relative weakness with regard to auditory encoding variables but no significant deficits on the Wechsler Adult Intelligence Scale.  The patient showed exceptionally good visual working memory and visual encoding capacity.  Some of this may have been due to increasing comfort level with the standardized testing procedures but I suspect that the patient's visual working memory has always been stronger than auditory encoding and the patient clearly has a strength with regard to visual encoding capacity.  Breaking memory functions down between auditory versus visual memory the patient produced an auditory memory index score of 93 which is in the average range and slightly below predicted levels of premorbid functioning.  However, it is consistent with storage and organizational capacities relative to  his performance on auditory encoding measures.  The patient produced a visual memory index score of 98 which falls at the 45th percentile and is in the average range and only slightly below predicted levels of premorbid functioning.  There was no significant difference between auditory versus visual memory from a statistical standpoint and they appear to be intact and only slightly below premorbid estimates.  Breaking memory functions down between immediate versus delayed memory the patient did show some significant weakness retaining information over a period of delay however, this significant drop in performance on delayed memory index where he produced a delayed memory index score of 88 and fell at the 21st percentile was almost exclusively seen for delayed visual memory components.  Under recognition/cued recall he showed significant improvements for 1 visual memory subtest but not another.  This would at least suggest that some of his delayed memory issues had to do with retrieval on 1 individual subtest but it was almost exclusively seen for visual memory without similar loss of her auditory memory regarding's retaining information over period of delay.          Primary Subtest Scaled Score Summary       Subtest Domain Raw Score Scaled Score Percentile Rank  Logical Memory I AM 20 8 25   Logical Memory II AM 20 10 50  Verbal Paired Associates I AM 19 8 25   Verbal Paired Associates II AM 7 9 37  Designs I VM 67 11 63  Designs II VM 37 7 16  Visual Reproduction I VM 40 14 91  Visual Reproduction II VM 11 7 16   Spatial Addition VWM 17 15 95  Symbol Span VWM 22 11 63          Auditory Memory Process Score Summary      Process Score Raw Score Scaled Score Percentile Rank Cumulative Percentage (Base Rate)  LM II Recognition 21 - - 17-25%  VPA II Recognition 38 - - 51-75%         Visual Memory Process Score Summary      Process Score Raw Score Scaled Score Percentile Rank Cumulative  Percentage (Base Rate)  DE I Content 34 10 50 -  DE I Spatial 17 11 63 -  DE II Content 27 8 25  -  DE II Spatial 8 7 16  -  DE II Recognition 8 - - 3-9%  VR II Recognition 4 - - 26-50%      Summary of Results:   Overall, the results of the objective neuropsychological assessment do show that the patient is generally doing well overall and his global performance is only slightly below predicted levels of premorbid functioning.  The patient is retaining language based information with no objective findings of weakness or difficulties relative to expressive or receptive language capacities.  The patient is showing good attentional capacities including his ability to sustain attention over time, his capacity for mental flexibility and shifting of attention, good information processing speed and focus execute abilities, adequate freedom from external distraction, impulse control issues etc.  His performance on the CPT measures would not be consistent with those related to attentional deficit disorder of an adult residual type.  The patient did have some focused weakness with regard to delayed memory and retention of information over time but this was isolated to visual memory components and not for auditory memory components with improvements with cued recall/recognition recall for one of  the two visual memory components.  This would suggest that it has to do with more of a functional nature rather than an inability to effectively store and organize new information.  The patient did exceptionally well on visual encoding challenges.  There were some relative weakness with regard to visual spatial and visual constructional capacity but no significant deficits noted in this domain.  Patient noted greatest weakness with regard to visual-spatial capacity on measures of nonverbal reasoning skills and perceptual organizational skills.  Impression/Diagnosis:   The results of the current neuropsychological evaluation  are not consistent with any type of findings indicating clear changes in cognitive functioning or progressive neurocognitive disorder.  I do suspect that the patient's day-to-day difficulties are more directly related to a mood disorder with previous diagnosis of bipolar type II disorder, anxiety, severe pain, poor sleep and to some degree impacts of medications for anxiety including alprazolam.  Patient also has significant issues with blood pressure and migraine headache.  Extended poor sleep, which is exacerbated by pain secondary to degenerative disc disease and recurrent posttraumatic headaches are all noted.  There are no specific abnormalities noted on MRI including no indications of significant microvascular ischemic disease.  At this point, I do think that the experiences of memory and attentional difficulties are likely more related to functional issues including his mood disorder, medications to treat his mood disorder, significant anxiety, significant pain including back pain and posttraumatic migraines etc.  Medications used to treat these can have a deleterious impact on attention and memory as well but nontreatment of these conditions would also have a deleterious impact on attention and memory.  We do have a very good baseline data set now.  I will sit down with the patient and go over the results of the current neuropsychological evaluation and some specific treatment recommendations for him individually.  The results of the current neuropsychological evaluation do not present any significant concern around progressive neurological disorder.  We have set the patient up for return visit 1 year from now to assess for any progression of cognitive issues and to determine whether repeat testing would be warranted.  It will be important for the patient to continue to focus on his global wellbeing with good physical activity, good nutrition and good sleep patterns.  Continued care for his depression and  anxiety based symptoms and mood disorder will also be important going forward.  Diagnosis:    Memory loss  Bipolar 2 disorder (HCC)  GAD (generalized anxiety disorder)  Insomnia, persistent   _____________________ Arley Phenix, Psy.D. Clinical Neuropsychologist

## 2023-10-17 NOTE — Progress Notes (Signed)
Neuropsychological Consultation   Patient:   Michael Garcia.   DOB:   1956-03-31  MR Number:  [id number]  Location:  [facility] A DEPT OF [facility] [address] [place], [place] [address] [place] [address] Dept: [phone]           Date of Service:   08/29/2023  Location of Service and Individuals present: Today's visit was conducted in my outpatient clinic office with the patient myself present.  Start Time:   8 AM End Time:   10 AM  Today's clinical visit consisted of a formal face-to-face clinical interview with a duration of 1 hour and 15 minutes and the other 45 minutes was spent with records review, report writing and setting up testing protocols.  Patient Consent and Confidentiality: Limits of confidentiality were described as the patient has been referred for neuropsychological evaluation that will be provided to his treating neurologist and will be made available in his electronic medical records for other appropriate medical providers to have access to.  The patient consents to these limits and understands the purpose of the evaluation and provides consent to go forward.  Consent for Evaluation and Treatment:  Signed:  Yes Explanation of Privacy Policies:  Signed:  Yes Discussion of Confidentiality Limits:  Yes  Provider/Observer:  Arley Phenix, Psy.D.       Clinical Neuropsychologist       Billing Code/Service: [phone]  Chief Complaint:     Chief Complaint  Patient presents with   Memory Loss   Other    Word finding difficulties and attention and concentration issues    Reason for Service:    Michael Petricca. is a 67 year old male referred for neuropsychological evaluation by Butch Penny, NP who has been following the patient since 2021 where he was initially seen by Stephanie Acre, MD.  The reason for initially being seen by neurology was due to recurrent  headaches that were initially diagnosed as intractable periodic headache syndrome and now carries a diagnosis of chronic migraine without aura without status migrainous not intractable.  During these headaches the patient gets paresthesia as well.  Patient most recently has about 6 or 7 migraines a month and the severity have been improving with medication interventions.  Patient has noted increasing difficulties with word finding for several years and describes difficulties are issues transposing numbers and words as if he is "dyslexic".  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  Patient has noted increased time for processing information.  Patient is managing all ADLs and IADLs.  Patient with a past psychiatric history including diagnosis of generalized anxiety disorder, bipolar disorder (Latuda and Wellbutrin along with alprazolam) and insomnia (trazodone for sleep) along with significant cervical pain with history of spinal fusion.  During today's clinical interview the patient reports that he would like to create a baseline to establish current status and consideration of what may be going on with him cognitively.  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  Patient describes short-term memory loss is minor but describes forgetfulness, attentional lapses that interrupt completion of tasks, occasional confusion, episodes of difficulty spelling and (brain fog.).  Patient notes that sometimes when his short-term memory is problematic that he has difficulty focusing  on 1 thing at a time.  Patient reports that he has had attentional issues for more than a decade with the memory changes or something more new.  Patient reports that he was never great with his memory but was able to manage his life.  Patient notes that he has  had a history of significant depressive events and dealt with significant depression in college.  Patient reports that he was hyperactive "ADHD" as a child and experiences lots of stressors and trauma as a child.  Patient notes childhood history of depression and behavioral issues, hyperactivity.  Patient endorses at least limited hypomanic episodes that included episodes of increased anger and agitation.  Patient reports that he had a significant fall when he was a preteen suffering retrograde and anterograde amnesia.  Patient describes this as a fall from 10 to 12 feet hitting his head.  The patient forts that he broke his elbows during this fall.  Patient has had C6-C7 fusion in 2019 as well as previous surgery for intradural schwannoma, experiences chronic shoulder and lower back pain.  Patient notes injuries from playing basketball and chronic pain currently from arthritic process.  Patient also has mild left leg and torso nerve damage.  Left-sided balance issues with occasional weakness are also noted.  Medical History:   Past Medical History:  Diagnosis Date   Anxiety    Benign non-nodular prostatic hyperplasia with lower urinary tract symptoms 11/21/2017   Bipolar 1 disorder, depressed (HCC) 07/25/2020   Cervical spondylosis without myelopathy 04/29/2014   Cervical stenosis of spinal canal 11/12/2008   Chronic headaches    Chronic myofascial pain 11/21/2017   Colon polyps 07/25/2020   Colonoscopy 04/2020, [place], benign, recall in 5 years   Degenerative disc disease, cervical 04/29/2014   Erectile disorder, acquired, generalized, severe 11/21/2017   GAD (generalized anxiety disorder) 03/29/2016   Gastro-esophageal reflux disease with esophagitis 11/21/2017   Glaucoma 11/21/2017   Incomplete right bundle branch block 10/18/2016   Insomnia, persistent 03/29/2016   Lichen planus, penis 11/21/2017   Nondependent alcohol abuse, in remission 03/29/2016   Nonintractable migraine, unspecified migraine  type 11/21/2017   Peyronie disease 11/12/2016   Status post cervical spinal fusion 04/29/2014   Thyroid nodule 11/21/2017   benign by FNA 04/2020         Patient Active Problem List   Diagnosis Date Noted   Mixed hyperlipidemia 01/15/2022   Prediabetes 01/15/2022   Bipolar 2 disorder (HCC) 07/25/2020   Colon polyps 07/25/2020   Stricture of anterior urethra in male 09/11/2018   Schwannoma of spinal cord (HCC) 04/22/2018   Benign non-nodular prostatic hyperplasia with lower urinary tract symptoms 11/21/2017   Chronic constipation 11/21/2017   Erectile dysfunction due to arterial insufficiency 11/21/2017   Gastritis 11/21/2017   Lichen planus 11/21/2017   Migraine headache 11/21/2017   Nontoxic thyroid nodule 11/21/2017   Glaucoma 11/21/2017   Chronic myofascial pain 11/21/2017   Peyronie's disease 11/12/2016   Incomplete right bundle branch block 10/18/2016   GAD (generalized anxiety disorder) 03/29/2016   Insomnia, persistent 03/29/2016   Nondependent alcohol abuse, in remission 03/29/2016   Cervical spondylosis without myelopathy 04/29/2014   Status post cervical spinal fusion 04/29/2014   Cervical stenosis of spinal canal 11/12/2008    Onset and Duration of Symptoms: Patient notes lifelong history of attentional issues and behavioral issues that go back to childhood and has been diagnosed with bipolar disorder and previously diagnosed with both bipolar 1 and bipolar 2 at various times.  Patient with significant anxiety and insomnia as well.  Patient notes more short-term memory issues and difficulty completing task recently.  Patient with long-term insomnia.  Patient has had hypomanic episodes as well as significant depressive episodes.  More recently patient notes more issues with short-term memory issues and difficulty completing tasks and more errors in completing work with word finding difficulties, transposing numbers and letters more frequently.  Progression of Symptoms:  Patient notes more recent changes with regard to short-term memory issues.  Additional Tests and Measures from other records:  Neuroimaging Results: Patient had MRI brain with and without contrast conducted on 04/03/2018.  There was no indication of acute abnormality.  White matter signal was normal for age and no atrophy noted.  There was no indication of chronic microhemorrhages or other abnormalities and this was felt to be a normal brain MRI.  Laboratory Tests: Patient has had regular blood workl and overall, blood work and other laboratory studies have been within normal limits.  This includes vitamin B12 and vitamin D studies as well as thyroid studies.  Sleep: Patient scribes lifelong issues with sleep particularly issues with insomnia and takes alprazolam and trazodone to help with sleep and addresses anxiety symptoms.  Diet Pattern: Patient describes an inconsistent appetite and inconsistent eating pattern.  Behavioral Observation/Mental Status:   Michael Madriaga.  presents as a 67 y.o.-year-old Right handed Caucasian Male who appeared his stated age. his dress was Appropriate and he was Well Groomed and his manners were Appropriate to the situation.  his participation was indicative of Appropriate and Attentive behaviors.  There were not physical disabilities noted.  he displayed an appropriate level of cooperation and motivation.    Interactions:    Active Appropriate  Attention:   within normal limits and attention span and concentration were age appropriate  Memory:   within normal limits; recent and remote memory intact  Visuo-spatial:   not examined  Speech (Volume):  normal  Speech:   normal; some very mild word finding issues were noted without paraphasic errors or circumlocutions.  Thought Process:  Coherent and Relevant  Coherent, Logical, and Organized  Though Content:  WNL; not suicidal and not homicidal  Orientation:   person, place, time/date, and  situation  Judgment:   Good  Planning:   Good  Affect:    Appropriate  Mood:    Anxious  Insight:   Good  Intelligence:   high  Marital Status/Living:  Patient was born in [place] along with 2 siblings.  He was born premature and weighed 5 pounds 6 ounces at birth.  Developmental milestones were reached at the appropriate time.  Normal childhood illnesses were noted including measles.  Patient notes that he was accident-prone as a child and had issues with vision.  Patient currently lives with his wife and adult daughter and has been married for 36 years without previous marriage.  Patient has had 39 year old son with no significant issues and a 75 year old daughter with numerous vascular and muscular vascular issues.  Patient's daughter has dealt with depression.  Educational and Occupational History:     Highest Level of Education:   Patient completed high school and then college graduating from [employer] and completed his doctorate in [place] attending [address], [employer].  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    Current Occupation:    Patient currently  works as a Audiological scientist.  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  Hobbies and Interests: Spirituality, exercising and spending time with his family.  Psychiatric History: Patient has a long history of attentional issues and depression and behavioral issues as a child, significant fall with concussive injury as preteen with a fall of 10 to 12 feet and retrograde and anterograde amnesia afterwards. Patient has had hypomanic episodes as an adult with significant depressive events and has been diagnosed at various times with both  bipolar 1 and bipolar 2 disorders.  Patient currently takes Jordan, Wellbutrin and alprazolam along with trazodone at night for sleep and also takes Lamictal primarily for headache management.  Abuse/Trauma History: Patient does describe some previous traumatic experiences as a child but these were not gone into in any depth.  History of Substance Use or Abuse:  There is a documented history of alcohol abuse confirmed by the patient.  Patient reports that he has had full sobriety since 2001.  Family Med/Psych History:  Family History  Problem Relation Age of Onset   Heart disease Mother    Irritable bowel syndrome Mother    Migraines Mother    Diabetes Sister    Neuropathy Daughter    Migraines Daughter    Healthy Son    Alzheimer's disease Neg Hx    Dementia Neg Hx     Impression/DX:   Michael Trease. is a 67 year old male referred for neuropsychological evaluation by Butch Penny, NP who has been following the patient since 2021 where he was initially seen by Stephanie Acre, MD.  The reason for initially being seen by neurology was due to recurrent headaches that were initially diagnosed as intractable periodic headache syndrome and now carries a diagnosis of chronic migraine without aura without status migrainous not intractable.  During these headaches the patient gets paresthesia as well.  Patient most recently has about 6 or 7 migraines a month and the severity have been improving with medication interventions.  Patient has noted increasing difficulties with word finding for several years and describes difficulties are issues transposing numbers and words as if he is "dyslexic".  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  Patient has noted increased time for processing information.  Patient is managing all ADLs and IADLs.  Patient with a past psychiatric history including diagnosis of generalized anxiety  disorder, bipolar disorder (Latuda and Wellbutrin along with alprazolam) and insomnia (trazodone for sleep) along with significant cervical pain with history of spinal fusion.  Disposition/Plan:  We have set the patient for formal neuropsychological evaluation he will complete a foundational battery of the Wechsler Adult Intelligence Scale, Wechsler Memory Scale, controlled oral Word Association test, and executive functioning measures.  Once this is completed a formal report will be made and feedback provided to the patient.  Diagnosis:    Memory loss  Attention and concentration deficit  Bipolar 2 disorder (HCC)  GAD (generalized anxiety disorder)  Insomnia, persistent        Note: This document was prepared using Dragon voice recognition software and may include unintentional dictation errors.   Electronically Signed   _______________________ Arley Phenix, Psy.D. Clinical Neuropsychologist

## 2023-10-18 DIAGNOSIS — F332 Major depressive disorder, recurrent severe without psychotic features: Secondary | ICD-10-CM | POA: Diagnosis not present

## 2023-10-21 ENCOUNTER — Ambulatory Visit: Payer: BC Managed Care – PPO | Admitting: Family Medicine

## 2023-10-21 VITALS — BP 126/72 | HR 70 | Temp 98.2°F | Ht 75.0 in | Wt 227.0 lb

## 2023-10-21 DIAGNOSIS — D334 Benign neoplasm of spinal cord: Secondary | ICD-10-CM | POA: Diagnosis not present

## 2023-10-21 DIAGNOSIS — R41840 Attention and concentration deficit: Secondary | ICD-10-CM

## 2023-10-21 DIAGNOSIS — M5416 Radiculopathy, lumbar region: Secondary | ICD-10-CM | POA: Diagnosis not present

## 2023-10-21 DIAGNOSIS — Z7251 High risk heterosexual behavior: Secondary | ICD-10-CM | POA: Diagnosis not present

## 2023-10-21 DIAGNOSIS — F411 Generalized anxiety disorder: Secondary | ICD-10-CM | POA: Diagnosis not present

## 2023-10-21 MED ORDER — TRAMADOL HCL 50 MG PO TABS
50.0000 mg | ORAL_TABLET | Freq: Three times a day (TID) | ORAL | 0 refills | Status: DC | PRN
Start: 2023-10-21 — End: 2024-05-12

## 2023-10-21 MED ORDER — PREDNISONE 10 MG PO TABS: ORAL_TABLET | ORAL | 0 refills | Status: DC

## 2023-10-21 NOTE — Progress Notes (Signed)
Subjective  CC:  Chief Complaint  Patient presents with   Back Pain    Pt stated that he has been having lower back pain for the past 2 weeks. He stated that he twisted it by turning wrong. He feels that it is getting better     HPI: Michael Garcia. is a 67 y.o. male who presents to the office today to address the problems listed above in the chief complaint. Discussed the use of AI scribe software for clinical note transcription with the patient, who gave verbal consent to proceed.  History of Present Illness   The patient, with a known history of chronic back problems, reports an acute exacerbation of low back pain approximately ten days ago. He describes the pain as severe, associated with significant swelling, and located around the waistband area, radiating to the left hip. The pain was so intense that it rendered him unable to stand or get out of bed. The patient reports some improvement since the onset but continues to experience discomfort.  He sought care from his usual provider, Dr. Alvester Morin, who recommended an MRI due to the failure of a previous injection therapy. However, the MRI was delayed due to the patient's penile implant, which required additional approval. The patient has not heard back regarding the MRI scheduling.  In the past, the patient has found relief from similar episodes with a course of steroids and tramadol. He denies any new bowel or bladder symptoms associated with this current episode.  In addition to his musculoskeletal concerns, the patient recently started on Descovy for HIV prophylaxis under the care of [facility]. He also underwent a neuropsychological evaluation due to concerns about memory issues. The evaluation revealed no evidence of dementia or Alzheimer's disease, but did suggest an attentional disorder and possible lifelong bipolar disorder. The patient reports struggling with anxiety, which can exacerbate when he feels unsuccessful or overwhelmed.  He currently manages his anxiety with Xanax, although he expresses interest in exploring other management strategies.      Assessment  1. Left lumbar radiculopathy   2. High risk sexual behavior, unspecified type   3. Schwannoma of spinal cord (HCC)   4. GAD (generalized anxiety disorder)   5. Attention or concentration deficit      Plan  Assessment and Plan    Low Back Pain with Radiculopathy Acute exacerbation of chronic low back pain with radiculopathy, likely due to misalignment. Pain radiates to the left hip. No new bowel or bladder issues. Left leg weakness noted but not recent or connected to the current back issue. Previous steroid injections were unsuccessful. Prefers anti-inflammatories and pain management. Discussed risks and benefits of prednisone and tramadol, including potential side effects such as gastrointestinal upset and dependency. - Prescribe a week-long prednisone taper - Prescribe tramadol for pain management - Advise use of heat and stretching exercises  Bipolar Disorder Recent diagnosis of bipolar disorder with lifelong symptoms, more problematic with age. Discussed importance of managing anxiety and mood swings, and potential need for psychiatric evaluation for medication management. - Discuss management strategies for bipolar disorder - Consider referral to a psychiatrist for further evaluation and management  Attention Deficit Disorder (ADD) Lifelong symptoms consistent with ADD. Recent neuropsychological testing complicated by anxiety and Xanax use. No dementia or Alzheimer's disease noted. Discussed non-pharmacological approaches such as mindfulness and meditation, acknowledging the challenge for those with attention disorders. - Recommend mindfulness and meditation practices - Discuss potential alternatives to Xanax for anxiety management  General Health Maintenance Currently on PrEP (Descovy) for HIV prevention, started two weeks ago. No adverse effects  reported. - Continue PrEP (Descovy) as prescribed  Follow-up - Return for follow-up as needed - Contact the office if symptoms worsen or do not improve.      No orders of the defined types were placed in this encounter.  Meds ordered this encounter  Medications   predniSONE (DELTASONE) 10 MG tablet    Sig: Take 4 tabs qd x 2 days, 3 qd x 2 days, 2 qd x 2d, 1qd x 3 days    Dispense:  21 tablet    Refill:  0   traMADol (ULTRAM) 50 MG tablet    Sig: Take 1 tablet (50 mg total) by mouth every 8 (eight) hours as needed.    Dispense:  20 tablet    Refill:  0      I reviewed the patients updated PMH, FH, and SocHx.    Patient Active Problem List   Diagnosis Date Noted   Mixed hyperlipidemia 01/15/2022    Priority: High   Prediabetes 01/15/2022    Priority: High   Bipolar 2 disorder (HCC) 07/25/2020    Priority: High   Schwannoma of spinal cord (HCC) 04/22/2018    Priority: High   Migraine headache 11/21/2017    Priority: High   Chronic myofascial pain 11/21/2017    Priority: High   GAD (generalized anxiety disorder) 03/29/2016    Priority: High   Cervical spondylosis without myelopathy 04/29/2014    Priority: High   Cervical stenosis of spinal canal 11/12/2008    Priority: High   Colon polyps 07/25/2020    Priority: Medium    Benign non-nodular prostatic hyperplasia with lower urinary tract symptoms 11/21/2017    Priority: Medium    Chronic constipation 11/21/2017    Priority: Medium    Gastritis 11/21/2017    Priority: Medium    Incomplete right bundle branch block 10/18/2016    Priority: Medium    Insomnia, persistent 03/29/2016    Priority: Medium    Stricture of anterior urethra in male 09/11/2018    Priority: Low   Erectile dysfunction due to arterial insufficiency 11/21/2017    Priority: Low   Lichen planus 11/21/2017    Priority: Low   Nontoxic thyroid nodule 11/21/2017    Priority: Low   Glaucoma 11/21/2017    Priority: Low   Peyronie's disease  11/12/2016    Priority: Low   Nondependent alcohol abuse, in remission 03/29/2016    Priority: Low   Status post cervical spinal fusion 04/29/2014    Priority: Low   Current Meds  Medication Sig   alfuzosin (UROXATRAL) 10 MG 24 hr tablet TAKE 1 TABLET(10 MG) BY MOUTH DAILY WITH BREAKFAST   ALPRAZolam (XANAX) 1 MG tablet Take 1 mg by mouth as needed. Take 1/2 tablet twice daily and 2 tablets at bedtime.   aspirin EC 81 MG tablet Take 81 mg by mouth daily.   Atogepant (QULIPTA) 60 MG TABS Take 1 tablet (60 mg total) by mouth daily.   buPROPion (WELLBUTRIN XL) 300 MG 24 hr tablet Take 450 mg by mouth daily.   cetirizine (ZYRTEC) 10 MG tablet Take 1 tablet by mouth daily.   Emtricitabine-Tenofovir AF (DESCOVY) 120-15 MG TABS    esomeprazole (NEXIUM) 40 MG capsule TAKE 1 CAPSULE(40 MG) BY MOUTH DAILY   FIBER PO Take by mouth.   fluticasone (FLONASE) 50 MCG/ACT nasal spray Place 1 spray into both nostrils daily.  gabapentin (NEURONTIN) 100 MG capsule TAKE 2 CAPSULES(200 MG) BY MOUTH TWICE DAILY   Glucosamine HCl (GLUCOSAMINE PO) Take by mouth.   lamoTRIgine (LAMICTAL) 150 MG tablet Take 200 mg by mouth 2 (two) times daily.   Lurasidone HCl (LATUDA PO) Take 40 mg by mouth at bedtime.   Multiple Vitamin (MULTIVITAMIN PO) Take by mouth.   MULTIPLE VITAMIN PO Take by mouth.   naratriptan (AMERGE) 2.5 MG tablet TAKE 1 TABLET BY MOUTH AT ONSET OF HEADACHE. IF RETURNS OR DOES NOT RESOLVE, MAY REPEAT AFTER 4 HOURS. DO NOT EXCEED 5MG  IN 24 HOURS   predniSONE (DELTASONE) 10 MG tablet Take 4 tabs qd x 2 days, 3 qd x 2 days, 2 qd x 2d, 1qd x 3 days   rosuvastatin (CRESTOR) 10 MG tablet Take 1 tablet (10 mg total) by mouth daily.   SPRAVATO, 84 MG DOSE, 28 MG/DEVICE SOPK Place into both nostrils.   tacrolimus (PROTOPIC) 0.1 % ointment in the morning and at bedtime.   traMADol (ULTRAM) 50 MG tablet Take 1 tablet (50 mg total) by mouth every 8 (eight) hours as needed.   traZODone (DESYREL) 150 MG tablet  Take 150 mg by mouth at bedtime.   triamcinolone cream (KENALOG) 0.1 %     Allergies: Patient is allergic to bacitracin, iodine, and sulfamethoxazole-trimethoprim. Family History: Patient family history includes Diabetes in his sister; Healthy in his son; Heart disease in his mother; Irritable bowel syndrome in his mother; Migraines in his daughter and mother; Neuropathy in his daughter. Social History:  Patient  reports that he quit smoking about 30 years ago. His smoking use included cigarettes. He has never used smokeless tobacco. He reports that he does not drink alcohol and does not use drugs.  Review of Systems: Constitutional: Negative for fever malaise or anorexia Cardiovascular: negative for chest pain Respiratory: negative for SOB or persistent cough Gastrointestinal: negative for abdominal pain  Objective  Vitals: BP 126/72   Pulse 70   Temp 98.2 F (36.8 C)   Ht 6\' 3"  (1.905 m)   Wt 227 lb (103 kg)   SpO2 96%   BMI 28.37 kg/m  General: no acute distress , A&Ox3 Antalgic gait with limp and use of cane   Commons side effects, risks, benefits, and alternatives for medications and treatment plan prescribed today were discussed, and the patient expressed understanding of the given instructions. Patient is instructed to call or message via MyChart if he/she has any questions or concerns regarding our treatment plan. No barriers to understanding were identified. We discussed Red Flag symptoms and signs in detail. Patient expressed understanding regarding what to do in case of urgent or emergency type symptoms.  Medication list was reconciled, printed and provided to the patient in AVS. Patient instructions and summary information was reviewed with the patient as documented in the AVS. This note was prepared with assistance of Dragon voice recognition software. Occasional wrong-word or sound-a-like substitutions may have occurred due to the inherent limitations of voice  recognition software

## 2023-10-21 NOTE — Patient Instructions (Signed)
Please follow up if symptoms do not improve or as needed.    VISIT SUMMARY:  During today's visit, we discussed your recent acute exacerbation of chronic low back pain, which has been causing severe discomfort and radiates to your left hip. We also reviewed your recent neuropsychological evaluation and discussed your ongoing management of bipolar disorder and anxiety. Additionally, we touched on your current HIV prophylaxis treatment and general health maintenance.  YOUR PLAN:  -LOW BACK PAIN WITH RADICULOPATHY: This condition involves pain that radiates from your lower back to other areas, such as your hip, often due to nerve irritation. We have prescribed a week-long course of prednisone to reduce inflammation and tramadol for pain relief. Additionally, we recommend using heat and performing stretching exercises to help manage the pain.  -BIPOLAR DISORDER: Bipolar disorder is a mental health condition characterized by extreme mood swings. We discussed the importance of managing your anxiety and mood swings and the potential need for a psychiatric evaluation to explore medication options.  -ATTENTION DEFICIT DISORDER (ADD): ADD is a condition that affects your ability to maintain attention and focus. We recommend trying mindfulness and meditation practices to help manage your symptoms and discussed potential alternatives to Xanax for anxiety management.  -GENERAL HEALTH MAINTENANCE: You are currently taking Descovy for HIV prevention, which you started two weeks ago, and have reported no adverse effects. Continue taking this medication as prescribed.  INSTRUCTIONS:  Please return for a follow-up visit as needed. Contact our office if your symptoms worsen or do not improve.

## 2023-10-22 DIAGNOSIS — F332 Major depressive disorder, recurrent severe without psychotic features: Secondary | ICD-10-CM | POA: Diagnosis not present

## 2023-10-25 DIAGNOSIS — F332 Major depressive disorder, recurrent severe without psychotic features: Secondary | ICD-10-CM | POA: Diagnosis not present

## 2023-11-01 DIAGNOSIS — F332 Major depressive disorder, recurrent severe without psychotic features: Secondary | ICD-10-CM | POA: Diagnosis not present

## 2023-11-04 ENCOUNTER — Encounter: Payer: Self-pay | Admitting: Physical Medicine and Rehabilitation

## 2023-11-07 ENCOUNTER — Other Ambulatory Visit: Payer: Self-pay | Admitting: Adult Health

## 2023-11-08 DIAGNOSIS — F332 Major depressive disorder, recurrent severe without psychotic features: Secondary | ICD-10-CM | POA: Diagnosis not present

## 2023-11-08 MED ORDER — GABAPENTIN 100 MG PO CAPS: 100.0000 mg | ORAL_CAPSULE | Freq: Two times a day (BID) | ORAL | 0 refills | Status: DC

## 2023-11-10 ENCOUNTER — Ambulatory Visit
Admission: RE | Admit: 2023-11-10 | Discharge: 2023-11-10 | Disposition: A | Discharge status: Home / Self Care | Payer: BC Managed Care – PPO | Source: Ambulatory Visit | Attending: Physical Medicine and Rehabilitation | Admitting: Physical Medicine and Rehabilitation

## 2023-11-10 DIAGNOSIS — M5416 Radiculopathy, lumbar region: Secondary | ICD-10-CM

## 2023-11-10 DIAGNOSIS — M47816 Spondylosis without myelopathy or radiculopathy, lumbar region: Secondary | ICD-10-CM | POA: Diagnosis not present

## 2023-11-15 DIAGNOSIS — F3181 Bipolar II disorder: Secondary | ICD-10-CM | POA: Diagnosis not present

## 2023-11-15 DIAGNOSIS — F411 Generalized anxiety disorder: Secondary | ICD-10-CM | POA: Diagnosis not present

## 2023-11-15 DIAGNOSIS — F332 Major depressive disorder, recurrent severe without psychotic features: Secondary | ICD-10-CM | POA: Diagnosis not present

## 2023-11-20 ENCOUNTER — Encounter: Payer: Self-pay | Admitting: Adult Health

## 2023-11-21 ENCOUNTER — Other Ambulatory Visit (HOSPITAL_COMMUNITY): Payer: Self-pay

## 2023-11-21 ENCOUNTER — Telehealth: Payer: Self-pay

## 2023-11-21 NOTE — Telephone Encounter (Signed)
 Spoke to pharmacy. They were able to process Qulipta through [facility] plan but this is different than the one they had on file that patient had provided. They are asking for pt to bring the other insurance card to them. They will fill the Qulipta.

## 2023-11-21 NOTE — Telephone Encounter (Signed)
     Per cmm no PA needed and got a successful test claim. No PA needed.

## 2023-11-21 NOTE — Telephone Encounter (Signed)
 PA team says they attempted test claim and it says no PA is needed. Pt can fill this. I tried to call the pharmacy but they are closed for lunch.

## 2023-11-23 ENCOUNTER — Encounter: Payer: Self-pay | Admitting: Family Medicine

## 2023-11-29 ENCOUNTER — Encounter: Payer: Self-pay | Admitting: Psychology

## 2023-11-29 DIAGNOSIS — F332 Major depressive disorder, recurrent severe without psychotic features: Secondary | ICD-10-CM | POA: Diagnosis not present

## 2023-11-29 NOTE — Progress Notes (Signed)
Neuropsychological Evaluation   Patient:  Michael Garcia.   DOB: [birth year]  MR Number: [id number]  Location: [facility] OF [facility] [address] [place], [place] [address] [place] [address] Dept: [phone]  Start: 9 AM End: 10 8 AM  Provider/Observer:     Hershal Coria PsyD  Chief Complaint:      Chief Complaint  Patient presents with   Memory Loss   Anxiety   Depression   Migraine   10/17/2023: Today I provided feedback regarding the results of the recent neuropsychological evaluation.  We went over treatment recommendations including continued work around his posttraumatic migraines and continued work on trying to improve sleep patterns and managing his underlying bipolar 2 disorder.  Patient remains compliant with these efforts.  We went over the results and I have included a copy of the reason for service for the evaluation and the summary of my evaluation below for convenience.  The initial clinical information can be found in the patient's EMR dated 08/29/2023 and the formal neuropsychological evaluative report can be found in his EMR dated 09/19/2023.   Reason For Service:      Michael Garcia. is a 68 year old male referred for neuropsychological evaluation by Butch Penny, NP, who has been following the patient since 2021.  The patient was initially seen by Stephanie Acre, MD.  The reason for initially being seen by neurology was due to recurrent headaches that were initially diagnosed as intractable periodic headache syndrome and now carries a diagnosis of chronic migraine without aura without status migrainous not intractable.  During these headaches, the patient gets paresthesia as well.  Patient most recently has about 6 or 7 migraines a month and the severity have been improving with medication interventions.  Patient has noted increasing difficulties with word  finding for several years and describes difficulties are issues transposing numbers and words as if he is "dyslexic".  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  Patient has noted increased time for processing information.  Patient is managing all ADLs and IADLs.  Patient with a past psychiatric history including diagnosis of generalized anxiety disorder, bipolar disorder (Latuda and Wellbutrin along with alprazolam) and insomnia (trazodone for sleep) along with significant cervical pain with history of spinal fusion.   During initial clinical interview, the patient reported that he would like to create a baseline to establish current status and consideration of what may be going on with him cognitively.  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  Patient describes short-term memory loss is minor but describes forgetfulness, attentional lapses that interrupt completion of tasks, occasional confusion, episodes of difficulty spelling and (brain fog.).  Patient notes that sometimes when his short-term memory is problematic that he has difficulty focusing on 1 thing at a time.  Patient reports that he has had attentional issues for more than a decade with the memory changes more recently.  Patient reports that he was never great with his memory but was able to manage his life.  Patient notes that he has had a history of significant depressive events and dealt with significant depression in college.  Patient reports that he was hyperactive "ADHD" as a child and experiences lots of stressors  and trauma as a child.  Patient notes childhood history of depression and behavioral issues, hyperactivity.  Patient endorses at least limited hypomanic episodes that included episodes of increased anger and agitation.   Patient reports that  he had a significant fall when he was a preteen suffering retrograde and anterograde amnesia.  Patient describes this as a fall from 10 to 12 feet hitting his head.  The patient reported that he broke his elbows during this fall.  Patient has had C6-C7 fusion in 2019 as well as previous surgery for intradural schwannoma, experiences chronic shoulder and lower back pain.  Patient notes injuries from playing basketball and chronic pain currently from arthritic process.  Patient also has mild left leg and torso nerve damage.  Left-sided balance issues with occasional weakness are also noted.    Impression/Diagnosis:   The results of the current neuropsychological evaluation are not consistent with any type of findings indicating clear changes in cognitive functioning or progressive neurocognitive disorder.  I do suspect that the patient's day-to-day difficulties are more directly related to a mood disorder with previous diagnosis of bipolar type II disorder, anxiety, severe pain, poor sleep and to some degree impacts of medications for anxiety including alprazolam.  Patient also has significant issues with blood pressure and migraine headache.  Extended poor sleep, which is exacerbated by pain secondary to degenerative disc disease and recurrent posttraumatic headaches are all noted.  There are no specific abnormalities noted on MRI including no indications of significant microvascular ischemic disease.  At this point, I do think that the experiences of memory and attentional difficulties are likely more related to functional issues including his mood disorder, medications to treat his mood disorder, significant anxiety, significant pain including back pain and posttraumatic migraines etc.  Medications used to treat these can have a deleterious impact on attention and memory as well but nontreatment of these conditions would also have a deleterious impact on attention and memory.  We do have a very good baseline  data set now.  I will sit down with the patient and go over the results of the current neuropsychological evaluation and some specific treatment recommendations for him individually.  The results of the current neuropsychological evaluation do not present any significant concern around progressive neurological disorder.  We have set the patient up for return visit 1 year from now to assess for any progression of cognitive issues and to determine whether repeat testing would be warranted.  It will be important for the patient to continue to focus on his global wellbeing with good physical activity, good nutrition and good sleep patterns.  Continued care for his depression and anxiety based symptoms and mood disorder will also be important going forward.  Diagnosis:    Memory loss  Bipolar 2 disorder (HCC)  GAD (generalized anxiety disorder)  Insomnia, persistent   _____________________ Arley Phenix, Psy.D. Clinical Neuropsychologist

## 2023-12-03 DIAGNOSIS — F332 Major depressive disorder, recurrent severe without psychotic features: Secondary | ICD-10-CM | POA: Diagnosis not present

## 2023-12-06 ENCOUNTER — Encounter: Payer: Self-pay | Admitting: Family Medicine

## 2023-12-06 DIAGNOSIS — F332 Major depressive disorder, recurrent severe without psychotic features: Secondary | ICD-10-CM | POA: Diagnosis not present

## 2023-12-06 DIAGNOSIS — R419 Unspecified symptoms and signs involving cognitive functions and awareness: Secondary | ICD-10-CM | POA: Insufficient documentation

## 2023-12-13 DIAGNOSIS — F332 Major depressive disorder, recurrent severe without psychotic features: Secondary | ICD-10-CM | POA: Diagnosis not present

## 2023-12-20 DIAGNOSIS — F3132 Bipolar disorder, current episode depressed, moderate: Secondary | ICD-10-CM | POA: Diagnosis not present

## 2023-12-20 DIAGNOSIS — F411 Generalized anxiety disorder: Secondary | ICD-10-CM | POA: Diagnosis not present

## 2023-12-20 DIAGNOSIS — G4700 Insomnia, unspecified: Secondary | ICD-10-CM | POA: Diagnosis not present

## 2023-12-20 DIAGNOSIS — F332 Major depressive disorder, recurrent severe without psychotic features: Secondary | ICD-10-CM | POA: Diagnosis not present

## 2023-12-27 DIAGNOSIS — F332 Major depressive disorder, recurrent severe without psychotic features: Secondary | ICD-10-CM | POA: Diagnosis not present

## 2024-01-03 DIAGNOSIS — F332 Major depressive disorder, recurrent severe without psychotic features: Secondary | ICD-10-CM | POA: Diagnosis not present

## 2024-01-10 DIAGNOSIS — F332 Major depressive disorder, recurrent severe without psychotic features: Secondary | ICD-10-CM | POA: Diagnosis not present

## 2024-01-14 DIAGNOSIS — F332 Major depressive disorder, recurrent severe without psychotic features: Secondary | ICD-10-CM | POA: Diagnosis not present

## 2024-01-14 DIAGNOSIS — H40013 Open angle with borderline findings, low risk, bilateral: Secondary | ICD-10-CM | POA: Diagnosis not present

## 2024-01-14 DIAGNOSIS — H5021 Vertical strabismus, right eye: Secondary | ICD-10-CM | POA: Diagnosis not present

## 2024-01-14 DIAGNOSIS — H25813 Combined forms of age-related cataract, bilateral: Secondary | ICD-10-CM | POA: Diagnosis not present

## 2024-01-17 DIAGNOSIS — F332 Major depressive disorder, recurrent severe without psychotic features: Secondary | ICD-10-CM | POA: Diagnosis not present

## 2024-01-24 DIAGNOSIS — F332 Major depressive disorder, recurrent severe without psychotic features: Secondary | ICD-10-CM | POA: Diagnosis not present

## 2024-01-31 DIAGNOSIS — F332 Major depressive disorder, recurrent severe without psychotic features: Secondary | ICD-10-CM | POA: Diagnosis not present

## 2024-02-07 DIAGNOSIS — F332 Major depressive disorder, recurrent severe without psychotic features: Secondary | ICD-10-CM | POA: Diagnosis not present

## 2024-02-14 DIAGNOSIS — F332 Major depressive disorder, recurrent severe without psychotic features: Secondary | ICD-10-CM | POA: Diagnosis not present

## 2024-02-19 DIAGNOSIS — G4700 Insomnia, unspecified: Secondary | ICD-10-CM | POA: Diagnosis not present

## 2024-02-19 DIAGNOSIS — F3181 Bipolar II disorder: Secondary | ICD-10-CM | POA: Diagnosis not present

## 2024-02-19 DIAGNOSIS — F411 Generalized anxiety disorder: Secondary | ICD-10-CM | POA: Diagnosis not present

## 2024-02-21 DIAGNOSIS — F332 Major depressive disorder, recurrent severe without psychotic features: Secondary | ICD-10-CM | POA: Diagnosis not present

## 2024-03-03 ENCOUNTER — Encounter: Payer: Self-pay | Admitting: Adult Health

## 2024-03-03 ENCOUNTER — Ambulatory Visit: Payer: BC Managed Care – PPO | Admitting: Adult Health

## 2024-03-03 VITALS — BP 115/73 | HR 64 | Ht 76.0 in | Wt 221.0 lb

## 2024-03-03 DIAGNOSIS — G43709 Chronic migraine without aura, not intractable, without status migrainosus: Secondary | ICD-10-CM | POA: Diagnosis not present

## 2024-03-03 DIAGNOSIS — R202 Paresthesia of skin: Secondary | ICD-10-CM

## 2024-03-03 MED ORDER — GABAPENTIN 100 MG PO CAPS: 200.0000 mg | ORAL_CAPSULE | Freq: Two times a day (BID) | ORAL | 11 refills | Status: AC

## 2024-03-03 NOTE — Patient Instructions (Signed)
 Your Plan:  Continue Qulipta  60 mg daily  Continue Amerge for abortive therapy  Continue gabapentin  200 mg twice a day  If your symptoms worsen or you develop new symptoms please let us  know.   Thank you for coming to see us  at [facility]. I hope we have been able to provide you high quality care today.  You may receive a patient satisfaction survey over the next few weeks. We would appreciate your feedback and comments so that we may continue to improve ourselves and the health of our patients.

## 2024-03-03 NOTE — Progress Notes (Signed)
 PATIENT: Michael Garcia. DOB: 15-Apr-1956  REASON FOR VISIT: follow up HISTORY FROM: patient   Chief Complaint  Patient presents with   RM 19    Patient is here alone for follow-up for migraines. He reports everything is stable. He wants to discuss Gabapentin .     HISTORY OF PRESENT ILLNESS: Today 03/03/24: Michael Ciavarella. is a 68 y.o. male with a history of migraine headaches and paresthesias. Returns today for follow-up.  At the last visit we increased Qulipta  to 60 mg daily.  He reports that has been beneficial. Reports about 2 headaches a week. Severity is better. More manageable. Uses the amerge and it resolves it quickly. No aura with migraines. No stroke-like symptoms with his migraines. No recent issues with BP or cardiac issues.   Reports on occasionally has burning and tingling in the lower extremities but infrequent. Numbness noted in the left foot. Feels like a block. Other days he doesn't feel it at all. Currently taking gabapentin  200 mg BID. No recent falls. Uses a cane when his balance feels more off.   He did have neuropsychological testing in December with Dr. Cheryll Corti which did not show an organic memory problem.  Memory issues thought to be related to potential mood disorder.  08/06/23: Michael Oldaker. is a 68 y.o. male with a history of Migraine headaches and Paresthesia. Returns today for follow-up.  Migraine frequency is stable. Feels that qulipta  is slightly better than Emgality .  Has approximately 5-6 migraines a month. Severity is better.  Continues to use Amerge for abortive therapy.  Has neuropsychological testing in December with Dr. Cheryll Corti.  01/01/23: Michael Rayl. is a 68 y.o. male with a history of migraine headache and paresthesia.. Returns today for follow-up. Migraine frequency is the same. Has about 6-7 migraines a month. Naratriptan  works well. The severity is better. Continues with Emgality .  He feels that Emgality  and Ajovy   works very similar.  Wants to discuss memory issues.  Reports that he has noticed trouble with word finding for several years.  Reports that he will transpose numbers or words as if he is dyslexic. 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 With road signs he feels that it takes his brain longer to process.  At home he is able to complete all ADLs independently.  He manages his own medications finances.  He reports that sometimes he has trouble remembering appointments.  Reports that Latuda was recently added to his medication regimen.  MRI of the brain with and without contrast Apr 03, 2018 IMPRESSION: Normal brain MRI.  06/20/22: Michael Garcia is a 68 year old male is a migraine headache and paresthesia. Returns today for follow-up. Reports that he is having 6 headaches a month. Reports that he has 2-3 back to back and then will go away for a while. This is new for him. Reports headaches used to be on the right side but now having some on the left side.  He is currently happy with using Emgality .  He does not wish to change that medication.  He reports that he did go off of gabapentin  due to gait abnormality.  He has been doing physical therapy and his gait has improved.  He continues to have paresthesias and would like to restart gabapentin  at a lower dose.  06/15/21:Michael Garcia is a 68 year old male with a history of migraine headaches and paresthesias in the lower extremities.  He returns today for follow-up.  The patient  states that he has approximately 6-7 breakthrough headaches a month.  He is currently on Ajovy  and Maxalt .  He states that headaches always occur on the right side.  Usually coming up the back of the head and into the temporal region.  He reports that Maxalt  typically works well for him.  He is open to trying another medication if it would give him better benefit.  Patient reports that Dr. Daisey Dryer increase his gabapentin  to 300 mg 3 times a day.  He  typically takes 600 in the morning and 300 at bedtime.  He notices midafternoon he has more discomfort in the legs.  He reports sharp shooting electric-like pain in the legs.  He reports that he had an exacerbation of his back pain and Dr. Daisey Dryer had to prescribe prednisone  which offered him great benefit.   12/06/20: Michael Garcia is a 68 year old male with a history of migraine headaches.  He returns today for follow-up.  He is currently on Ajovy  and Maxalt .  Reports that his headaches remain under good control.  States that he has approximately 7-8 migraine headaches a month.  He states the intensity and the longevity of the headache has improved.  He states that 90% of the time he takes Maxalt  his headache will resolve in 1 to 2 hours.  Patient reports that since he stopped gabapentin  he has noticed more discomfort in the feet.  He would like to restart gabapentin .  Denies any changes with his gait or balance.  He has done 2 rounds of physical therapy and thinks his balance has improved.  He does not use an assistive device when ambulating.    06/02/20: Michael Garcia is a 68 year old male with a history of migraine headaches.  He returns today for follow-up.  He is currently on Emgality  and Maxalt .  He reports that he has a headache almost every other day.  The severity of his headaches vary.  He states that he can use Maxalt  and that usually offer some benefit.  Typically uses at least 9 tablets a month.  He also uses ice and heat therapy.  He also has a compounded cream that he rubs on the back of his neck.  In the past he has been on Depakote  and gabapentin .  Both of these medications offer good benefit with his headaches but he began having tremor and gait abnormalities.  He returns today for an evaluation.  HISTORY (copied from Dr. Gisele Lamas note)  Michael Garcia is a 68 year old right-handed white male with a history of migraine headaches.  He also has a history of a schwannoma resection from the conus  medullaris, he is followed through [facility] for this.  He has migraine headaches mainly in the left occipital distribution, the combination of Emgality , Depakote , and gabapentin  significantly reduced the headache frequency, he was only having about 2 headaches a month on this medication regimen.  However, the patient developed significant gait instability on the gabapentin  and severe tremors of the upper extremities on Depakote .  He was forced to come off these medications and subsequently his headaches have worsened.  He believes that Emgality  worked better than Aimovig  that he is on currently.  He is now having 8 or 9 headache days a month, he may miss work on occasion because of this.  He is having ongoing problems with left leg discomfort secondary to the schwannoma residual.  He will be having MRI of the lumbar spine in the next month.  He has not had any  further falls, his handwriting has gotten much better.  He returns for an evaluation.  Maxalt  is effective in controlling the headache if he takes it early.  REVIEW OF SYSTEMS: Out of a complete 14 system review of symptoms, the patient complains only of the following symptoms, and all other reviewed systems are negative.  See HPI  ALLERGIES: Allergies  Allergen Reactions   Bacitracin Itching and Rash   Iodine Itching and Rash    IV Iodine    Sulfamethoxazole-Trimethoprim Rash    HOME MEDICATIONS: Outpatient Medications Prior to Visit  Medication Sig Dispense Refill   alfuzosin  (UROXATRAL ) 10 MG 24 hr tablet TAKE 1 TABLET(10 MG) BY MOUTH DAILY WITH BREAKFAST 90 tablet 0   ALPRAZolam  (XANAX ) 1 MG tablet Take 1 mg by mouth as needed. Take 1/2 tablet twice daily and 2 tablets at bedtime.  2   aspirin EC 81 MG tablet Take 81 mg by mouth daily.     Atogepant  (QULIPTA ) 60 MG TABS Take 1 tablet (60 mg total) by mouth daily. 30 tablet 11   buPROPion  (WELLBUTRIN  XL) 300 MG 24 hr tablet Take 450 mg by mouth daily.     cetirizine (ZYRTEC)  10 MG tablet Take 1 tablet by mouth daily.     Emtricitabine-Tenofovir AF (DESCOVY) 120-15 MG TABS      esomeprazole  (NEXIUM ) 40 MG capsule TAKE 1 CAPSULE(40 MG) BY MOUTH DAILY 30 capsule 0   FIBER PO Take by mouth.     fluticasone  (FLONASE ) 50 MCG/ACT nasal spray Place 1 spray into both nostrils daily. 16 g 11   gabapentin  (NEURONTIN ) 100 MG capsule Take 1 capsule (100 mg total) by mouth 2 (two) times daily. 360 capsule 0   Glucosamine HCl (GLUCOSAMINE PO) Take by mouth.     lamoTRIgine  (LAMICTAL ) 150 MG tablet Take 200 mg by mouth 2 (two) times daily.     Lurasidone HCl (LATUDA PO) Take 40 mg by mouth at bedtime.     Multiple Vitamin (MULTIVITAMIN PO) Take by mouth.     MULTIPLE VITAMIN PO Take by mouth.     naratriptan  (AMERGE) 2.5 MG tablet TAKE 1 TABLET BY MOUTH AT ONSET OF HEADACHE. IF RETURNS OR DOES NOT RESOLVE, MAY REPEAT AFTER 4 HOURS. DO NOT EXCEED 5MG  IN 24 HOURS 10 tablet 11   predniSONE  (DELTASONE ) 10 MG tablet Take 4 tabs qd x 2 days, 3 qd x 2 days, 2 qd x 2d, 1qd x 3 days 21 tablet 0   rosuvastatin  (CRESTOR ) 10 MG tablet Take 1 tablet (10 mg total) by mouth daily. 90 tablet 3   SPRAVATO, 84 MG DOSE, 28 MG/DEVICE SOPK Place into both nostrils.     tacrolimus (PROTOPIC) 0.1 % ointment in the morning and at bedtime.     traMADol  (ULTRAM ) 50 MG tablet Take 1 tablet (50 mg total) by mouth every 8 (eight) hours as needed. 20 tablet 0   traZODone  (DESYREL ) 150 MG tablet Take 150 mg by mouth at bedtime.     triamcinolone cream (KENALOG) 0.1 %      No facility-administered medications prior to visit.    PAST MEDICAL HISTORY: Past Medical History:  Diagnosis Date   Anxiety    Benign non-nodular prostatic hyperplasia with lower urinary tract symptoms 11/21/2017   Bipolar 1 disorder, depressed (HCC) 07/25/2020   Cervical spondylosis without myelopathy 04/29/2014   Cervical stenosis of spinal canal 11/12/2008   Chronic headaches    Chronic myofascial pain 11/21/2017   Colon polyps  07/25/2020  Colonoscopy 04/2020, [facility] [place], benign, recall in 5 years   Degenerative disc disease, cervical 04/29/2014   Erectile disorder, acquired, generalized, severe 11/21/2017   GAD (generalized anxiety disorder) 03/29/2016   Gastro-esophageal reflux disease with esophagitis 11/21/2017   Glaucoma 11/21/2017   Incomplete right bundle branch block 10/18/2016   Insomnia, persistent 03/29/2016   Lichen planus, penis 11/21/2017   Nondependent alcohol  abuse, in remission 03/29/2016   Nonintractable migraine, unspecified migraine type 11/21/2017   Peyronie disease 11/12/2016   Status post cervical spinal fusion 04/29/2014   Thyroid  nodule 11/21/2017   benign by FNA 04/2020    PAST SURGICAL HISTORY: Past Surgical History:  Procedure Laterality Date   CERVICAL SPINE SURGERY  2010   c6-c7 fusion   HERNIA REPAIR      FAMILY HISTORY: Family History  Problem Relation Age of Onset   Heart disease Mother    Irritable bowel syndrome Mother    Migraines Mother    Diabetes Sister    Neuropathy Daughter    Migraines Daughter    Healthy Son    Alzheimer's disease Neg Hx    Dementia Neg Hx     SOCIAL HISTORY: Social History   Socioeconomic History   Marital status: Married    Spouse name: Not on file   Number of children: 2   Years of education: Not on file   Highest education level: Professional school degree (e.g., MD, DDS, DVM, JD)  Occupational History   Occupation: Conservation officer, nature in [place]  Tobacco Use   Smoking status: Former    Current packs/day: 0.00    Types: Cigarettes    Quit date: 1994    Years since quitting: 31.3   Smokeless tobacco: Never  Vaping Use   Vaping status: Never Used  Substance and Sexual Activity   Alcohol  use: No   Drug use: No   Sexual activity: Yes  Other Topics Concern   Not on file  Social History Narrative   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 Identifies as gay, came out at age 22; remains married to wife.    Social Drivers of  Corporate investment banker Strain: Low Risk  (10/21/2023)   Overall Financial Resource Strain (CARDIA)    Difficulty of Paying Living Expenses: Not very hard  Food Insecurity: No Food Insecurity (10/21/2023)   Hunger Vital Sign    Worried About Running Out of Food in the Last Year: Never true    Ran Out of Food in the Last Year: Never true  Transportation Needs: No Transportation Needs (10/21/2023)   PRAPARE - Administrator, Civil Service (Medical): No    Lack of Transportation (Non-Medical): No  Physical Activity: Insufficiently Active (10/21/2023)   Exercise Vital Sign    Days of Exercise per Week: 1 day    Minutes of Exercise per Session: 60 min  Stress: Stress Concern Present (10/21/2023)   [employer] of Occupational Health - Occupational Stress Questionnaire    Feeling of Stress : Rather much  Social Connections: Socially Isolated (10/21/2023)   Social Connection and Isolation Panel [NHANES]    Frequency of Communication with Friends and Family: Once a week    Frequency of Social Gatherings with Friends and Family: Once a week    Attends Religious Services: Never    Database administrator or Organizations: No    Attends Engineer, structural: Not on file    Marital Status: Married  Catering manager Violence: Not on file  PHYSICAL EXAM  There were no vitals filed for this visit.   There is no height or weight on file to calculate BMI.     01/01/2023    8:19 AM 01/01/2023    8:18 AM  Montreal Cognitive Assessment   Visuospatial/ Executive (0/5) 3 3  Naming (0/3) 3   Attention: Read list of digits (0/2) 1   Attention: Read list of letters (0/1) 1   Attention: Serial 7 subtraction starting at 100 (0/3) 3   Language: Repeat phrase (0/2) 2   Language : Fluency (0/1) 1   Abstraction (0/2) 2   Delayed Recall (0/5) 5   Orientation (0/6) 6   Total 27        Generalized: Well developed, in no acute distress   Neurological examination   Mentation: Alert oriented to time, place, history taking. Follows all commands speech and language fluent Cranial nerve II-XII: Pupils were equal round reactive to light. Extraocular movements were full, visual field were full on confrontational test.  Head turning and shoulder shrug  were normal and symmetric. Motor: The motor testing reveals 5 over 5 strength of all 4 extremities. Good symmetric motor tone is noted throughout.  Sensory: Sensory testing is intact to soft touch on all 4 extremities. No evidence of extinction is noted.  Coordination: Cerebellar testing reveals good finger-nose-finger and heel-to-shin bilaterally. Tremor in the upper extremities- action tremor Gait and station: Gait is normal.  Tandem gait slightly unsteady   DIAGNOSTIC DATA (LABS, IMAGING, TESTING) - I reviewed patient records, labs, notes, testing and imaging myself where available.  Lab Results  Component Value Date   WBC 4.5 07/10/2023   HGB 13.0 07/10/2023   HCT 39.7 07/10/2023   MCV 98.6 07/10/2023   PLT 205.0 07/10/2023      Component Value Date/Time   NA 140 07/10/2023 0849   NA 138 09/14/2019 0929   K 4.6 07/10/2023 0849   CL 101 07/10/2023 0849   CO2 33 (H) 07/10/2023 0849   GLUCOSE 104 (H) 07/10/2023 0849   BUN 7 07/10/2023 0849   BUN 8 09/14/2019 0929   CREATININE 1.17 07/10/2023 0849   CALCIUM  9.6 07/10/2023 0849   PROT 7.3 07/10/2023 0849   PROT 7.8 09/14/2019 0929   ALBUMIN 4.5 07/10/2023 0849   ALBUMIN 5.0 (H) 09/14/2019 0929   AST 15 07/10/2023 0849   ALT 12 07/10/2023 0849   ALKPHOS 64 07/10/2023 0849   BILITOT 0.5 07/10/2023 0849   BILITOT 0.4 09/14/2019 0929   GFRNONAA >60 07/08/2020 1653   GFRAA >60 07/08/2020 1653   Lab Results  Component Value Date   CHOL 118 07/10/2023   HDL 62.40 07/10/2023   LDLCALC 45 07/10/2023   TRIG 57.0 07/10/2023   CHOLHDL 2 07/10/2023   Lab Results  Component Value Date   HGBA1C 5.6 07/10/2023   Lab Results  Component Value Date    VITAMINB12 277 04/18/2018   Lab Results  Component Value Date   TSH 1.71 07/10/2023      ASSESSMENT AND PLAN 68 y.o. year old male  has a past medical history of Anxiety, Benign non-nodular prostatic hyperplasia with lower urinary tract symptoms (11/21/2017), Bipolar 1 disorder, depressed (HCC) (07/25/2020), Cervical spondylosis without myelopathy (04/29/2014), Cervical stenosis of spinal canal (11/12/2008), Chronic headaches, Chronic myofascial pain (11/21/2017), Colon polyps (07/25/2020), Degenerative disc disease, cervical (04/29/2014), Erectile disorder, acquired, generalized, severe (11/21/2017), GAD (generalized anxiety disorder) (03/29/2016), Gastro-esophageal reflux disease with esophagitis (11/21/2017), Glaucoma (11/21/2017), Incomplete right bundle branch block (  10/18/2016), Insomnia, persistent (03/29/2016), Lichen planus, penis (11/21/2017), Nondependent alcohol  abuse, in remission (03/29/2016), Nonintractable migraine, unspecified migraine type (11/21/2017), Peyronie disease (11/12/2016), Status post cervical spinal fusion (04/29/2014), and Thyroid  nodule (11/21/2017). here with :  Migraine headaches  Continue Qulipta  to 60 mg daily for prevention.   Continue Amerge for abortive therapy.     Paresthesias lower extremities  Continue gabapentin  20 mg twice a day  Follow-up in 1 year or sooner if needed   Previous patient of Dr. Tilda Fogo.  Dr. Tresia Fruit will be listed as his primary neurologist.  Clem Currier, MSN, NP-C 03/03/2024, 8:54 AM [facility] [address], [address] [place], [place] [address] [phone]

## 2024-03-06 DIAGNOSIS — F332 Major depressive disorder, recurrent severe without psychotic features: Secondary | ICD-10-CM | POA: Diagnosis not present

## 2024-03-07 ENCOUNTER — Encounter: Payer: Self-pay | Admitting: Adult Health

## 2024-03-10 DIAGNOSIS — F332 Major depressive disorder, recurrent severe without psychotic features: Secondary | ICD-10-CM | POA: Diagnosis not present

## 2024-03-11 NOTE — Telephone Encounter (Signed)
 Spoke with patient and scheduled appointment with Dr. Gracie Lav for 05/19/24 at 9am

## 2024-03-16 ENCOUNTER — Telehealth: Payer: Self-pay

## 2024-03-16 ENCOUNTER — Other Ambulatory Visit (HOSPITAL_COMMUNITY): Payer: Self-pay

## 2024-03-16 NOTE — Telephone Encounter (Signed)
 Pharmacy Patient Advocate Encounter   Received notification from CoverMyMeds that prior authorization for Qulipta  30MG  tablets is required/requested.   Insurance verification completed.   The patient is insured through [facility] .   Per test claim: The current 30 day co-pay is, $0.  No PA needed at this time. This test claim was processed through [facility]- copay amounts may vary at other pharmacies due to pharmacy/plan contracts, or as the patient moves through the different stages of their insurance plan.

## 2024-03-20 DIAGNOSIS — F332 Major depressive disorder, recurrent severe without psychotic features: Secondary | ICD-10-CM | POA: Diagnosis not present

## 2024-04-03 ENCOUNTER — Other Ambulatory Visit (HOSPITAL_COMMUNITY): Payer: Self-pay

## 2024-04-03 ENCOUNTER — Telehealth: Payer: Self-pay

## 2024-04-03 NOTE — Telephone Encounter (Signed)
 Pharmacy Patient Advocate Encounter   Received notification from CoverMyMeds that prior authorization for Qulipta  60MG  tablets is required/requested.   Insurance verification completed.   The patient is insured through [facility] .   Per test claim: PA required; PA submitted to above mentioned insurance via CoverMyMeds Key/confirmation #/EOC [id number] Status is pending

## 2024-04-08 ENCOUNTER — Other Ambulatory Visit (HOSPITAL_COMMUNITY): Payer: Self-pay

## 2024-04-08 NOTE — Telephone Encounter (Signed)
 Pharmacy Patient Advocate Encounter  Received notification from [facility] that Prior Authorization for Qulipta  60MG  tablets has been APPROVED from 04/03/2024 to 04/03/2025. Ran test claim, Copay is $0. This test claim was processed through [facility]- copay amounts may vary at other pharmacies due to pharmacy/plan contracts, or as the patient moves through the different stages of their insurance plan.   PA #/Case ID/Reference #: PA Case ID #: [id number]

## 2024-04-13 DIAGNOSIS — L309 Dermatitis, unspecified: Secondary | ICD-10-CM | POA: Diagnosis not present

## 2024-04-13 DIAGNOSIS — D1801 Hemangioma of skin and subcutaneous tissue: Secondary | ICD-10-CM | POA: Diagnosis not present

## 2024-04-13 DIAGNOSIS — L821 Other seborrheic keratosis: Secondary | ICD-10-CM | POA: Diagnosis not present

## 2024-04-13 DIAGNOSIS — L439 Lichen planus, unspecified: Secondary | ICD-10-CM | POA: Diagnosis not present

## 2024-04-17 DIAGNOSIS — F332 Major depressive disorder, recurrent severe without psychotic features: Secondary | ICD-10-CM | POA: Diagnosis not present

## 2024-04-30 DIAGNOSIS — F332 Major depressive disorder, recurrent severe without psychotic features: Secondary | ICD-10-CM | POA: Diagnosis not present

## 2024-05-06 DIAGNOSIS — F332 Major depressive disorder, recurrent severe without psychotic features: Secondary | ICD-10-CM | POA: Diagnosis not present

## 2024-05-12 ENCOUNTER — Encounter: Payer: Self-pay | Admitting: Neurology

## 2024-05-12 ENCOUNTER — Ambulatory Visit (INDEPENDENT_AMBULATORY_CARE_PROVIDER_SITE_OTHER): Admitting: Neurology

## 2024-05-12 VITALS — BP 114/71 | HR 64 | Ht 76.0 in | Wt 224.0 lb

## 2024-05-12 DIAGNOSIS — R202 Paresthesia of skin: Secondary | ICD-10-CM | POA: Insufficient documentation

## 2024-05-12 DIAGNOSIS — G43709 Chronic migraine without aura, not intractable, without status migrainosus: Secondary | ICD-10-CM

## 2024-05-12 DIAGNOSIS — Z79899 Other long term (current) drug therapy: Secondary | ICD-10-CM | POA: Insufficient documentation

## 2024-05-12 MED ORDER — QULIPTA 60 MG PO TABS: 60.0000 mg | ORAL_TABLET | Freq: Every day | ORAL | 3 refills | Status: AC

## 2024-05-12 MED ORDER — NARATRIPTAN HCL 2.5 MG PO TABS: ORAL_TABLET | ORAL | 11 refills | Status: AC

## 2024-05-12 NOTE — Progress Notes (Signed)
 Chief Complaint  Patient presents with   Migraine    RM 15 alone Pt is well and stable, reports no new concerns since last visit.       ASSESSMENT AND PLAN  Katai Marsico. is a 68 y.o. male   Chronic migraine Bipolar disorder Polypharmacy treatment   His migraine is overall under good control taking Qulipta  60 mg daily, Amerge as needed for abortive treatment, refill his prescription  Return in 1 year,   DIAGNOSTIC DATA (LABS, IMAGING, TESTING) - I reviewed patient records, labs, notes, testing and imaging myself where available.   MEDICAL HISTORY:  Khalil Szczepanik. is here to follow-up for his chronic migraine, his primary care is Dr. Jodie, [place], was seen by Dr. Jenel since 2019, then nurse practitioner [place]    History is obtained from the patient and review of electronic medical records. I personally reviewed pertinent available imaging films in PACS.   PMHx of  GERD Chronic migraine Bipolar HLD Neuropathy Cervical decompression surgery Lumbar decompression surgery for schwannoma in July 2019 at [facility]    He has a long history of mood disorder, on polypharmacy treatment, work as a Audiological scientist, typical migraine is lateralized retro-orbital area headache, with light, noise sensitivity, lasting for few hours, Amerge as needed works well,  He has tried different preventive medication in the past, Emgality , Ajovy , Qulipta  daily works better  MRI of the brain with without contrast in May 2019 was normal   He is having headaches 6-8 times each month, happy with current headache control,  PHYSICAL EXAM:   Vitals:   05/12/24 0835  BP: 114/71  Pulse: 64  Weight: 224 lb (101.6 kg)  Height: 6' 4 (1.93 m)   Body mass index is 27.27 kg/m.  PHYSICAL EXAMNIATION:  Gen: NAD, conversant, well nourised, well groomed                     Cardiovascular: Regular rate rhythm, no peripheral edema, warm, nontender. Eyes: Conjunctivae clear without  exudates or hemorrhage Neck: Supple, no carotid bruits. Pulmonary: Clear to auscultation bilaterally   NEUROLOGICAL EXAM:  MENTAL STATUS: Speech/cognition: Awake, alert, oriented to history taking and casual conversation CRANIAL NERVES: CN II: Visual fields are full to confrontation. Pupils are round equal and briskly reactive to light. CN III, IV, VI: extraocular movement are normal. No ptosis. CN V: Facial sensation is intact to light touch CN VII: Face is symmetric with normal eye closure  CN VIII: Hearing is normal to causal conversation. CN IX, X: Phonation is normal. CN XI: Head turning and shoulder shrug are intact  MOTOR: There is no pronator drift of out-stretched arms. Muscle bulk and tone are normal. Muscle strength is normal.  REFLEXES: Reflexes are 1 and symmetric at the biceps, triceps, knees, and ankles. Plantar responses are flexor.  SENSORY: Intact to light touch,  and vibratory sensation    COORDINATION: There is no trunk or limb dysmetria noted.  GAIT/STANCE: Posture is normal. Gait is steady  REVIEW OF SYSTEMS:  Full 14 system review of systems performed and notable only for as above All other review of systems were negative.   ALLERGIES: Allergies  Allergen Reactions   Bacitracin Itching and Rash   Iodine Itching and Rash    IV Iodine    Sulfamethoxazole-Trimethoprim Rash    HOME MEDICATIONS: Current Outpatient Medications  Medication Sig Dispense Refill   alfuzosin  (UROXATRAL ) 10 MG 24 hr tablet TAKE 1 TABLET(10 MG) BY MOUTH  DAILY WITH BREAKFAST 90 tablet 0   ALPRAZolam  (XANAX ) 1 MG tablet Take 1 mg by mouth as needed. Take 1/2 tablet twice daily and 2 tablets at bedtime.  2   aspirin EC 81 MG tablet Take 81 mg by mouth daily.     Atogepant  (QULIPTA ) 60 MG TABS Take 1 tablet (60 mg total) by mouth daily. 30 tablet 11   buPROPion  (WELLBUTRIN  XL) 300 MG 24 hr tablet Take 450 mg by mouth daily.     cetirizine (ZYRTEC) 10 MG tablet Take 1  tablet by mouth daily.     Emtricitabine-Tenofovir AF (DESCOVY) 120-15 MG TABS      esomeprazole  (NEXIUM ) 40 MG capsule TAKE 1 CAPSULE(40 MG) BY MOUTH DAILY 30 capsule 0   FIBER PO Take by mouth.     fluticasone  (FLONASE ) 50 MCG/ACT nasal spray Place 1 spray into both nostrils daily. 16 g 11   gabapentin  (NEURONTIN ) 100 MG capsule Take 2 capsules (200 mg total) by mouth 2 (two) times daily. 120 capsule 11   Glucosamine HCl (GLUCOSAMINE PO) Take by mouth.     lamoTRIgine  (LAMICTAL ) 150 MG tablet Take 200 mg by mouth 2 (two) times daily.     Multiple Vitamin (MULTIVITAMIN PO) Take by mouth.     naratriptan  (AMERGE) 2.5 MG tablet TAKE 1 TABLET BY MOUTH AT ONSET OF HEADACHE. IF RETURNS OR DOES NOT RESOLVE, MAY REPEAT AFTER 4 HOURS. DO NOT EXCEED 5MG  IN 24 HOURS 10 tablet 11   rosuvastatin  (CRESTOR ) 10 MG tablet Take 1 tablet (10 mg total) by mouth daily. 90 tablet 3   SPRAVATO, 84 MG DOSE, 28 MG/DEVICE SOPK Place into both nostrils.     tacrolimus (PROTOPIC) 0.1 % ointment in the morning and at bedtime.     traZODone  (DESYREL ) 150 MG tablet Take 150 mg by mouth at bedtime.     triamcinolone cream (KENALOG) 0.1 %      No current facility-administered medications for this visit.    PAST MEDICAL HISTORY: Past Medical History:  Diagnosis Date   Anxiety    Benign non-nodular prostatic hyperplasia with lower urinary tract symptoms 11/21/2017   Bipolar 1 disorder, depressed (HCC) 07/25/2020   Cervical spondylosis without myelopathy 04/29/2014   Cervical stenosis of spinal canal 11/12/2008   Chronic headaches    Chronic myofascial pain 11/21/2017   Colon polyps 07/25/2020   Colonoscopy 04/2020, [place], benign, recall in 5 years   Degenerative disc disease, cervical 04/29/2014   Erectile disorder, acquired, generalized, severe 11/21/2017   GAD (generalized anxiety disorder) 03/29/2016   Gastro-esophageal reflux disease with esophagitis 11/21/2017   Glaucoma 11/21/2017   Incomplete right bundle branch  block 10/18/2016   Insomnia, persistent 03/29/2016   Lichen planus, penis 11/21/2017   Nondependent alcohol  abuse, in remission 03/29/2016   Nonintractable migraine, unspecified migraine type 11/21/2017   Peyronie disease 11/12/2016   Status post cervical spinal fusion 04/29/2014   Thyroid  nodule 11/21/2017   benign by FNA 04/2020    PAST SURGICAL HISTORY: Past Surgical History:  Procedure Laterality Date   CERVICAL SPINE SURGERY  2010   c6-c7 fusion   HERNIA REPAIR      FAMILY HISTORY: Family History  Problem Relation Age of Onset   Heart disease Mother    Irritable bowel syndrome Mother    Migraines Mother    Diabetes Sister    Neuropathy Daughter    Migraines Daughter    Healthy Son    Alzheimer's disease Neg Hx  Dementia Neg Hx     SOCIAL HISTORY: Social History   Socioeconomic History   Marital status: Married    Spouse name: Not on file   Number of children: 2   Years of education: Not on file   Highest education level: Professional school degree (e.g., MD, DDS, DVM, JD)  Occupational History   Occupation: Conservation officer, nature in [place]  Tobacco Use   Smoking status: Former    Current packs/day: 0.00    Types: Cigarettes    Quit date: 1994    Years since quitting: 31.5   Smokeless tobacco: Never  Vaping Use   Vaping status: Never Used  Substance and Sexual Activity   Alcohol  use: No   Drug use: No   Sexual activity: Yes  Other Topics Concern   Not on file  Social History Narrative   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 Identifies as gay, came out at age 70; remains married to wife.    Social Drivers of Corporate investment banker Strain: Low Risk  (10/21/2023)   Overall Financial Resource Strain (CARDIA)    Difficulty of Paying Living Expenses: Not very hard  Food Insecurity: No Food Insecurity (10/21/2023)   Hunger Vital Sign    Worried About Running Out of Food in the Last Year: Never true    Ran Out of Food in the Last Year: Never true   Transportation Needs: No Transportation Needs (10/21/2023)   PRAPARE - Administrator, Civil Service (Medical): No    Lack of Transportation (Non-Medical): No  Physical Activity: Insufficiently Active (10/21/2023)   Exercise Vital Sign    Days of Exercise per Week: 1 day    Minutes of Exercise per Session: 60 min  Stress: Stress Concern Present (10/21/2023)   [employer] of Occupational Health - Occupational Stress Questionnaire    Feeling of Stress : Rather much  Social Connections: Socially Isolated (10/21/2023)   Social Connection and Isolation Panel    Frequency of Communication with Friends and Family: Once a week    Frequency of Social Gatherings with Friends and Family: Once a week    Attends Religious Services: Never    Database administrator or Organizations: No    Attends Engineer, structural: Not on file    Marital Status: Married  Catering manager Violence: Not on file      Modena Callander, M.D. Ph.D.  [facility] [address], [address] [place], [place] [address] Ph: [phone] Fax: [phone]  CC:  Jodie Lavern CROME, MD [address] [place],  [place] [address]  Jodie Lavern CROME, MD

## 2024-05-14 DIAGNOSIS — F332 Major depressive disorder, recurrent severe without psychotic features: Secondary | ICD-10-CM | POA: Diagnosis not present

## 2024-05-19 ENCOUNTER — Ambulatory Visit: Admitting: Neurology

## 2024-05-25 DIAGNOSIS — L439 Lichen planus, unspecified: Secondary | ICD-10-CM | POA: Diagnosis not present

## 2024-05-25 DIAGNOSIS — L280 Lichen simplex chronicus: Secondary | ICD-10-CM | POA: Diagnosis not present

## 2024-05-25 DIAGNOSIS — I788 Other diseases of capillaries: Secondary | ICD-10-CM | POA: Diagnosis not present

## 2024-05-29 DIAGNOSIS — F332 Major depressive disorder, recurrent severe without psychotic features: Secondary | ICD-10-CM | POA: Diagnosis not present

## 2024-06-03 DIAGNOSIS — F39 Unspecified mood [affective] disorder: Secondary | ICD-10-CM | POA: Diagnosis not present

## 2024-06-03 DIAGNOSIS — F411 Generalized anxiety disorder: Secondary | ICD-10-CM | POA: Diagnosis not present

## 2024-06-04 ENCOUNTER — Other Ambulatory Visit (HOSPITAL_COMMUNITY): Payer: Self-pay

## 2024-06-08 DIAGNOSIS — F331 Major depressive disorder, recurrent, moderate: Secondary | ICD-10-CM | POA: Diagnosis not present

## 2024-06-12 DIAGNOSIS — F332 Major depressive disorder, recurrent severe without psychotic features: Secondary | ICD-10-CM | POA: Diagnosis not present

## 2024-06-15 ENCOUNTER — Telehealth: Payer: Self-pay | Admitting: Pharmacist

## 2024-06-15 NOTE — Telephone Encounter (Signed)
 Pharmacy Patient Advocate Encounter   Received notification from Patient Pharmacy that prior authorization for Qulipta  60MG  tablets is required/requested.   Insurance verification completed.   The patient is insured through [facility] .   Per test claim: PA required; PA submitted to above mentioned insurance via CoverMyMeds Key/confirmation #/EOC [facility] Status is pending

## 2024-06-15 NOTE — Telephone Encounter (Signed)
 Pharmacy Patient Advocate Encounter  Received notification from [facility] that Prior Authorization for Qulipta  60MG  tablets has been APPROVED from 06/15/2024 to 06/15/2025   PA #/Case ID/Reference #: [id number]

## 2024-06-19 DIAGNOSIS — F3181 Bipolar II disorder: Secondary | ICD-10-CM | POA: Diagnosis not present

## 2024-06-19 DIAGNOSIS — F411 Generalized anxiety disorder: Secondary | ICD-10-CM | POA: Diagnosis not present

## 2024-06-25 DIAGNOSIS — F331 Major depressive disorder, recurrent, moderate: Secondary | ICD-10-CM | POA: Diagnosis not present

## 2024-07-03 DIAGNOSIS — F39 Unspecified mood [affective] disorder: Secondary | ICD-10-CM | POA: Diagnosis not present

## 2024-07-03 DIAGNOSIS — F411 Generalized anxiety disorder: Secondary | ICD-10-CM | POA: Diagnosis not present

## 2024-07-03 DIAGNOSIS — F331 Major depressive disorder, recurrent, moderate: Secondary | ICD-10-CM | POA: Diagnosis not present

## 2024-07-06 DIAGNOSIS — F331 Major depressive disorder, recurrent, moderate: Secondary | ICD-10-CM | POA: Diagnosis not present

## 2024-07-10 ENCOUNTER — Encounter: Payer: Self-pay | Admitting: Family Medicine

## 2024-07-10 ENCOUNTER — Ambulatory Visit: Payer: BC Managed Care – PPO | Admitting: Family Medicine

## 2024-07-10 VITALS — BP 117/70 | HR 72 | Temp 98.4°F | Ht 76.0 in | Wt 225.4 lb

## 2024-07-10 DIAGNOSIS — E782 Mixed hyperlipidemia: Secondary | ICD-10-CM | POA: Diagnosis not present

## 2024-07-10 DIAGNOSIS — K5909 Other constipation: Secondary | ICD-10-CM

## 2024-07-10 DIAGNOSIS — D334 Benign neoplasm of spinal cord: Secondary | ICD-10-CM

## 2024-07-10 DIAGNOSIS — Z0001 Encounter for general adult medical examination with abnormal findings: Secondary | ICD-10-CM | POA: Diagnosis not present

## 2024-07-10 DIAGNOSIS — F411 Generalized anxiety disorder: Secondary | ICD-10-CM | POA: Diagnosis not present

## 2024-07-10 DIAGNOSIS — N401 Enlarged prostate with lower urinary tract symptoms: Secondary | ICD-10-CM

## 2024-07-10 DIAGNOSIS — R7303 Prediabetes: Secondary | ICD-10-CM | POA: Diagnosis not present

## 2024-07-10 DIAGNOSIS — M7918 Myalgia, other site: Secondary | ICD-10-CM

## 2024-07-10 DIAGNOSIS — F3181 Bipolar II disorder: Secondary | ICD-10-CM | POA: Diagnosis not present

## 2024-07-10 DIAGNOSIS — G8929 Other chronic pain: Secondary | ICD-10-CM

## 2024-07-10 DIAGNOSIS — M4802 Spinal stenosis, cervical region: Secondary | ICD-10-CM

## 2024-07-10 DIAGNOSIS — E041 Nontoxic single thyroid nodule: Secondary | ICD-10-CM

## 2024-07-10 LAB — COMPREHENSIVE METABOLIC PANEL WITH GFR
ALT: 12 U/L (ref 0–53)
AST: 14 U/L (ref 0–37)
Albumin: 4.6 g/dL (ref 3.5–5.2)
Alkaline Phosphatase: 57 U/L (ref 39–117)
BUN: 8 mg/dL (ref 6–23)
CO2: 32 meq/L (ref 19–32)
Calcium: 9.5 mg/dL (ref 8.4–10.5)
Chloride: 100 meq/L (ref 96–112)
Creatinine, Ser: 1.13 mg/dL (ref 0.40–1.50)
GFR: 67.88 mL/min (ref >60.00)
Glucose, Bld: 96 mg/dL (ref 70–99)
Potassium: 5.1 meq/L (ref 3.5–5.1)
Sodium: 140 meq/L (ref 135–145)
Total Bilirubin: 0.6 mg/dL (ref 0.2–1.2)
Total Protein: 7.1 g/dL (ref 6.0–8.3)

## 2024-07-10 LAB — LIPID PANEL
Cholesterol: 133 mg/dL (ref 0–200)
HDL: 65.8 mg/dL (ref >39.00)
LDL Cholesterol: 49 mg/dL (ref 0–99)
NonHDL: 67.64
Total CHOL/HDL Ratio: 2
Triglycerides: 91 mg/dL (ref 0.0–149.0)
VLDL: 18.2 mg/dL (ref 0.0–40.0)

## 2024-07-10 LAB — CBC WITH DIFFERENTIAL/PLATELET
Basophils Absolute: 0 K/uL (ref 0.0–0.1)
Basophils Relative: 1 % (ref 0.0–3.0)
Eosinophils Absolute: 0.2 K/uL (ref 0.0–0.7)
Eosinophils Relative: 3.6 % (ref 0.0–5.0)
HCT: 40.1 % (ref 39.0–52.0)
Hemoglobin: 13.4 g/dL (ref 13.0–17.0)
Lymphocytes Relative: 30.3 % (ref 12.0–46.0)
Lymphs Abs: 1.3 K/uL (ref 0.7–4.0)
MCHC: 33.5 g/dL (ref 30.0–36.0)
MCV: 101.3 fl — ABNORMAL HIGH (ref 78.0–100.0)
Monocytes Absolute: 0.3 K/uL (ref 0.1–1.0)
Monocytes Relative: 7.8 % (ref 3.0–12.0)
Neutro Abs: 2.4 K/uL (ref 1.4–7.7)
Neutrophils Relative %: 57.3 % (ref 43.0–77.0)
Platelets: 204 K/uL (ref 150.0–400.0)
RBC: 3.96 Mil/uL — ABNORMAL LOW (ref 4.22–5.81)
RDW: 12.7 % (ref 11.5–15.5)
WBC: 4.2 K/uL (ref 4.0–10.5)

## 2024-07-10 LAB — TSH: TSH: 2.2 u[IU]/mL (ref 0.35–5.50)

## 2024-07-10 LAB — PSA: PSA: 1.17 ng/mL (ref 0.10–4.00)

## 2024-07-10 LAB — HEMOGLOBIN A1C: Hgb A1c MFr Bld: 5.8 % (ref 4.6–6.5)

## 2024-07-10 NOTE — Progress Notes (Signed)
 Subjective  Chief Complaint  Patient presents with   Annual Exam    Pt here for annual Exam and is currently fasting    Hyperlipidemia   Insomnia   Manic Behavior   Prediabetes    HPI: Michael Garcia. is a 68 y.o. male who presents to [facility] [place] today for a Male Wellness Visit. He also has the concerns and/or needs as listed above in the chief complaint. These will be addressed in addition to the Health Maintenance Visit.   Wellness Visit: annual visit with health maintenance review and exam  HM: up to date. Crc screen due in spring next year for polyp surveillance.  Chronic disease f/u and/or acute problem visit: (deemed necessary to be done in addition to the wellness visit): Discussed the use of AI scribe software for clinical note transcription with the patient, who gave verbal consent to proceed.  History of Present Illness Michael Garcia. Katrinka is a 68 year old male with arthritis and migraines who presents with worsening arthritis symptoms and medication management issues.  He describes worsening arthritis symptoms, particularly in his hands, with significant loss of strength and poor balance, impacting his daily activities.  He is undergoing medication management with a new psychiatrist at [facility], Harlene Bohr. His antidepressants have been reduced, which he feels has negatively impacted his mental health. However, Spravato treatment is going well. He has moved all his care to [facility] for convenience and continuity.  He experiences a sensation of fluid in his left ear, described as 'water bubbling.' He takes generic Benadryl at night for allergies, which he finds insufficient as it only lasts three to four hours. He also reports significant sinus issues and drainage in the back of his throat.  For his migraines, he takes Naratriptan  (Maxalt ) for breakthrough pain and another prophylactic medication daily, though he could not recall the  name. His neurologist has indicated that his primary care provider can refill these medications.  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 He was advised that he does not need to return unless there are further issues.  He works long hours, typically ten to twelve hours a day, four days a week, which leaves him exhausted by the end of the week. He often shuts down on Fridays and struggles to recover over the weekend, especially if he has a Spravato treatment on Friday.  Needs f/u for prediabetes and hld on statin. No sxs of hyperglycemia. Tolerates statin.    Assessment  1. Encounter for well adult exam with abnormal findings   2. Chronic myofascial pain   3. Bipolar 2 disorder (HCC)   4. GAD (generalized anxiety disorder)   5. Cervical stenosis of spinal canal   6. Mixed hyperlipidemia   7. Prediabetes   8. Schwannoma of spinal cord (HCC)   9. Chronic constipation   10. Benign non-nodular prostatic hyperplasia with lower urinary tract symptoms   11. Nontoxic thyroid  nodule      Plan  Male Wellness Visit: Age appropriate Health Maintenance and Prevention measures were discussed with patient. Included topics are cancer screening recommendations, ways to keep healthy (see AVS) including dietary and exercise recommendations, regular eye and dental care, use of seat belts, and avoidance of moderate alcohol  use and tobacco use.  BMI: discussed patient's BMI and encouraged positive lifestyle modifications to help get to or maintain a target  BMI. HM needs and immunizations were addressed and ordered. See below for orders. See HM and immunization section for updates. Routine labs and screening tests ordered including cmp, cbc and lipids where appropriate. Discussed recommendations regarding Vit D and calcium  supplementation (see AVS)  Chronic disease management visit and/or acute  problem visit: Assessment and Plan Assessment & Plan Depression and generalized anxiety disorder Suboptimal symptom control due to reduced antidepressants. Spravato effective. Aging, stiffness, poor balance, and long work hours may exacerbate mental health issues. - Continue Spravato treatment. Continue psychiatric care  Migraine Uses naratriptan  for breakthrough migraines and daily prophylactic medication, qulipta . Neurologist's prescriptions managed by primary care. - Continue naratriptan  for breakthrough migraines. - Primary care to manage prescription refills for migraine medications.  Allergic rhinitis with eustachian tube dysfunction Fluid sensation in left ear, sinus congestion, and post-nasal drip due to allergies. Currently on Benadryl. - Switch from Benadryl to a long-acting antihistamine like Zyrtec or Allegra. - Use Flonase  nasal spray for sinus congestion and post-nasal drip.  Hand osteoarthritis Worsening arthritis in hands causing difficulty with daily activities, stiffness, and strength loss. - Encourage exercise, strengthening, and stretching to manage symptoms and improve strength and balance.  Recheck lipids and A1c.  Monitor thyroid . Chronic pain mgt per specialists. Hopefully mood will improve. Supportive counseling given.   Follow up: 12 mo for cpe and f/u  Orders Placed This Encounter  Procedures   CBC with Differential/Platelet   Comprehensive metabolic panel with GFR   Lipid panel   Hemoglobin A1c   TSH   PSA   No orders of the defined types were placed in this encounter.     Body mass index is 27.44 kg/m. Wt Readings from Last 3 Encounters:  07/10/24 225 lb 6.4 oz (102.2 kg)  05/12/24 224 lb (101.6 kg)  03/03/24 221 lb (100.2 kg)     Patient Active Problem List   Diagnosis Date Noted   Mixed hyperlipidemia 01/15/2022    Priority: High   Prediabetes 01/15/2022    Priority: High   Bipolar 2 disorder (HCC) 07/25/2020    Priority: High    Schwannoma of spinal cord (HCC) 04/22/2018    Priority: High    S/p excision; with residual left weakness and neuropathy    Migraine headache 11/21/2017    Priority: High   Chronic myofascial pain 11/21/2017    Priority: High   GAD (generalized anxiety disorder) 03/29/2016    Priority: High   Cervical spondylosis without myelopathy 04/29/2014    Priority: High   Cervical stenosis of spinal canal 11/12/2008    Priority: High   Colon polyps 07/25/2020    Priority: Medium     Colonoscopy 04/2020, [facility], benign, recall in 5 years    Benign non-nodular prostatic hyperplasia with lower urinary tract symptoms 11/21/2017    Priority: Medium    Chronic constipation 11/21/2017    Priority: Medium     Most of adult life.  No history of IBS.  Treated with MiraLAX  and fiber.Using MiraLAX  as needed.  No changes or melena.  Never has been treated with prescription medications. 2024, recommended daily MiraLAX .    Gastritis 11/21/2017    Priority: Medium     By EGD 04/2020; h/o h.pylori and GERD. On chronic PPIs. Will need GI in [place]    Incomplete right bundle branch block 10/18/2016    Priority: Medium     Pt reported. Nl EKG 10/2020 here. Reportedly normal and ECHO in 2018 with Cards in [place].  Insomnia, persistent 03/29/2016    Priority: Medium    Stricture of anterior urethra in male 09/11/2018    Priority: Low   Erectile dysfunction due to arterial insufficiency 11/21/2017    Priority: Low    Followed by [facility]  Stable, Chronic  App scheduled to discuss possible penile prosthesis with [facility]  per 07/17/18 note     Lichen planus 11/21/2017    Priority: Low    Lichen planus of the penis, improved - Cont Protopic 0.1% BID to the lesions  - Can consider applying after trauma (i.e. Sexual intercourse) to the area to prevent koebnerization     Nontoxic thyroid  nodule 11/21/2017    Priority: Low    Nontoxic, FNA 04/2020: benign nodule.    Glaucoma 11/21/2017    Priority:  Low   Peyronie's disease 11/12/2016    Priority: Low    Followed by [facility]  Stable, chronic     Nondependent alcohol  abuse, in remission 03/29/2016    Priority: Low   Status post cervical spinal fusion 04/29/2014    Priority: Low   Paresthesias 05/12/2024   Polypharmacy 05/12/2024   Cognitive complaints with normal neuropsychological exam 12/06/2023    Neuropsychological testing/exam 11/2023, Dr. Efraim. The results of the current neuropsychological evaluation are not consistent with any type of findings indicating clear changes in cognitive functioning or progressive neurocognitive disorder. I do suspect that the patient's day-to-day difficulties are more directly related to a mood disorder with previous diagnosis of bipolar type II disorder, anxiety, severe pain, poor sleep and to some degree impacts of medications for anxiety including alprazolam  . Neg brain MRI. Medications used to treat these can have a deleterious impact on attention and memory as well but nontreatment of these conditions would also have a deleterious impact on attention and memory     Health Maintenance  Topic Date Due   INFLUENZA VACCINE  06/12/2024   COVID-19 Vaccine (7 - Moderna risk 2024-25 season) 07/26/2024 (Originally 01/11/2024)   Colonoscopy  03/03/2025   DTaP/Tdap/Td (2 - Td or Tdap) 08/06/2027   Pneumococcal Vaccine: 50+ Years  Completed   Hepatitis C Screening  Completed   Zoster Vaccines- Shingrix  Completed   HPV VACCINES  Aged Out   Meningococcal B Vaccine  Aged Out   Immunization History  Administered Date(s) Administered   Fluad Trivalent(High Dose 65+) 07/10/2023   Hepatitis A 10/23/2016, 04/24/2017   Influenza Split 11/22/2015   Influenza,inj,Quad PF,6+ Mos 07/21/2018, 07/25/2020, 08/13/2022   Influenza-Unspecified 09/24/2016, 10/23/2017, 08/23/2021   Moderna Covid-19 Vaccine Bivalent Booster 34yrs & up 08/23/2021   Moderna Sars-Covid-2 Vaccination 11/19/2019, 12/18/2019,  08/27/2020, 04/22/2021   PNEUMOCOCCAL CONJUGATE-20 07/10/2023   Pneumococcal Polysaccharide-23 05/16/2018   Tdap 08/05/2017   Unspecified SARS-COV-2 Vaccination 07/14/2023   Zoster Recombinant(Shingrix) 07/25/2020, 02/01/2021   We updated and reviewed the patient's past history in detail and it is documented below. Allergies: Patient is allergic to bacitracin, iodine, and sulfamethoxazole-trimethoprim. Past Medical History Patient  has a past medical history of Allergy (Iodine/Bactrin), Anemia (2001), Anxiety, Arthritis (2000), Benign non-nodular prostatic hyperplasia with lower urinary tract symptoms (11/21/2017), Bipolar 1 disorder, depressed (HCC) (07/25/2020), Cervical spondylosis without myelopathy (04/29/2014), Cervical stenosis of spinal canal (11/12/2008), Chronic headaches, Chronic myofascial pain (11/21/2017), Colon polyps (07/25/2020), Degenerative disc disease, cervical (04/29/2014), Depression (1980), Erectile disorder, acquired, generalized, severe (11/21/2017), GAD (generalized anxiety disorder) (03/29/2016), Gastro-esophageal reflux disease with esophagitis (11/21/2017), GERD (gastroesophageal reflux disease) (1979), Glaucoma (11/21/2017), Incomplete right bundle branch block (10/18/2016), Insomnia, persistent (03/29/2016), Lichen planus,  penis (11/21/2017), Nondependent alcohol  abuse, in remission (03/29/2016), Nonintractable migraine, unspecified migraine type (11/21/2017), Peyronie disease (11/12/2016), Status post cervical spinal fusion (04/29/2014), Substance abuse (HCC) (1990), and Thyroid  nodule (11/21/2017). Past Surgical History Patient  has a past surgical history that includes Hernia repair (1981/1993); Cervical spine surgery (2010); and Spine surgery (2010+2019). Family History: Patient family history includes ADD / ADHD in his daughter; Alcohol  abuse in his father; Anxiety disorder in his daughter; Arthritis in his mother; Asthma in his sister; COPD in his mother; Cancer  in his father; Depression in his sister and sister; Diabetes in his sister and sister; Drug abuse in his father and sister; Early death in his father and mother; Healthy in his son; Heart disease in his mother; Irritable bowel syndrome in his mother; Kidney disease in his mother; Migraines in his daughter and mother; Neuropathy in his daughter; Obesity in his sister and sister; Varicose Veins in his maternal grandmother and mother. Social History:  Patient  reports that he quit smoking about 31 years ago. His smoking use included cigarettes. He has a 30 pack-year smoking history. He has never used smokeless tobacco. He reports that he does not drink alcohol  and does not use drugs.  Review of Systems: Constitutional: negative for fever or malaise Ophthalmic: negative for photophobia, double vision or loss of vision Cardiovascular: negative for chest pain, dyspnea on exertion, or new LE swelling Respiratory: negative for SOB or persistent cough Gastrointestinal: negative for abdominal pain, change in bowel habits or melena Genitourinary: negative for dysuria or gross hematuria, no abnormal uterine bleeding or disharge Musculoskeletal: negative for new gait disturbance or muscular weakness Integumentary: negative for new or persistent rashes, no breast lumps Neurological: negative for TIA or stroke symptoms Psychiatric: negative for SI or delusions Allergic/Immunologic: negative for hives  Patient Care Team    Relationship Specialty Notifications Start End  Jodie Lavern CROME, MD PCP - General Family Medicine  07/25/20   Eldonna Novel, MD Consulting Physician Physical Medicine and Rehabilitation  04/17/18   Alvaro Ricardo KATHEE Mickey., MD Consulting Physician Urology  07/22/18   Chipper Viviane POUR, MD Referring Physician Psychiatry  11/18/18   Jenel Carlin POUR, MD (Inactive) Consulting Physician Neurology  07/25/20   Jolaine Charmaine Flair, MD Referring Physician Neurosurgery  08/01/20   Bettie Bernardino Dover,  MD Referring Physician Urology  10/17/21     Objective  Vitals: BP 117/70   Pulse 72   Temp 98.4 F (36.9 C)   Ht 6' 4 (1.93 m)   Wt 225 lb 6.4 oz (102.2 kg)   SpO2 95%   BMI 27.44 kg/m  General:  Well developed, well nourished, no acute distress  Psych:  Alert and orientedx3,flat mood and affect HEENT:  Normocephalic, atraumatic, non-icteric sclera,  supple neck without adenopathy, mass or thyromegaly Cardiovascular:  Normal S1, S2, RRR without gallop, rub or murmur Respiratory:  Good breath sounds bilaterally, CTAB with normal respiratory effort Gastrointestinal: normal bowel sounds, soft, non-tender, no noted masses. No HSM MSK: extremities without edema, joints without erythema or swelling Neurologic:    Mental status is normal.  Gross motor and sensory exams are normal.  No tremor  Commons side effects, risks, benefits, and alternatives for medications and treatment plan prescribed today were discussed, and the patient expressed understanding of the given instructions. Patient is instructed to call or message via MyChart if he/she has any questions or concerns regarding our treatment plan. No barriers to understanding were identified. We discussed Red Flag symptoms and signs  in detail. Patient expressed understanding regarding what to do in case of urgent or emergency type symptoms.  Medication list was reconciled, printed and provided to the patient in AVS. Patient instructions and summary information was reviewed with the patient as documented in the AVS. This note was prepared with assistance of Dragon voice recognition software. Occasional wrong-word or sound-a-like substitutions may have occurred due to the inherent limitations of voice recognition software

## 2024-07-10 NOTE — Patient Instructions (Signed)
 Please return in 12 months for your annual complete physical; please come fasting.   I will release your lab results to you on your MyChart account with further instructions. You may see the results before I do, but when I review them I will send you a message with my report or have my assistant call you if things need to be discussed. Please reply to my message with any questions. Thank you!   If you have any questions or concerns, please don't hesitate to send me a message via MyChart or call the office at [phone]. Thank you for visiting with us  today! It's our pleasure caring for you.

## 2024-07-13 ENCOUNTER — Ambulatory Visit: Payer: Self-pay | Admitting: Family Medicine

## 2024-07-13 NOTE — Progress Notes (Signed)
 See mychart note Dear Mr. Michael Garcia, All of your lab results look good overall.  I hope you feel better soon . No changes are needed on my end.  Sincerely, Dr. Jodie

## 2024-07-17 DIAGNOSIS — F331 Major depressive disorder, recurrent, moderate: Secondary | ICD-10-CM | POA: Diagnosis not present

## 2024-07-30 DIAGNOSIS — F331 Major depressive disorder, recurrent, moderate: Secondary | ICD-10-CM | POA: Diagnosis not present

## 2024-07-31 DIAGNOSIS — F39 Unspecified mood [affective] disorder: Secondary | ICD-10-CM | POA: Diagnosis not present

## 2024-07-31 DIAGNOSIS — F331 Major depressive disorder, recurrent, moderate: Secondary | ICD-10-CM | POA: Diagnosis not present

## 2024-07-31 DIAGNOSIS — F411 Generalized anxiety disorder: Secondary | ICD-10-CM | POA: Diagnosis not present

## 2024-07-31 DIAGNOSIS — F1010 Alcohol abuse, uncomplicated: Secondary | ICD-10-CM | POA: Diagnosis not present

## 2024-08-10 DIAGNOSIS — F331 Major depressive disorder, recurrent, moderate: Secondary | ICD-10-CM | POA: Diagnosis not present

## 2024-08-21 DIAGNOSIS — F331 Major depressive disorder, recurrent, moderate: Secondary | ICD-10-CM | POA: Diagnosis not present

## 2024-09-03 DIAGNOSIS — F331 Major depressive disorder, recurrent, moderate: Secondary | ICD-10-CM | POA: Diagnosis not present

## 2024-09-04 DIAGNOSIS — F331 Major depressive disorder, recurrent, moderate: Secondary | ICD-10-CM | POA: Diagnosis not present

## 2024-09-04 DIAGNOSIS — F411 Generalized anxiety disorder: Secondary | ICD-10-CM | POA: Diagnosis not present

## 2024-09-04 DIAGNOSIS — F39 Unspecified mood [affective] disorder: Secondary | ICD-10-CM | POA: Diagnosis not present

## 2024-09-04 DIAGNOSIS — F1010 Alcohol abuse, uncomplicated: Secondary | ICD-10-CM | POA: Diagnosis not present

## 2024-09-14 ENCOUNTER — Encounter: Payer: Self-pay | Admitting: Radiology

## 2024-09-14 DIAGNOSIS — F331 Major depressive disorder, recurrent, moderate: Secondary | ICD-10-CM | POA: Diagnosis not present

## 2024-09-22 DIAGNOSIS — F39 Unspecified mood [affective] disorder: Secondary | ICD-10-CM | POA: Diagnosis not present

## 2024-09-22 DIAGNOSIS — F331 Major depressive disorder, recurrent, moderate: Secondary | ICD-10-CM | POA: Diagnosis not present

## 2024-09-22 DIAGNOSIS — F411 Generalized anxiety disorder: Secondary | ICD-10-CM | POA: Diagnosis not present

## 2024-09-25 DIAGNOSIS — F331 Major depressive disorder, recurrent, moderate: Secondary | ICD-10-CM | POA: Diagnosis not present

## 2024-09-26 ENCOUNTER — Encounter: Payer: Self-pay | Admitting: Family Medicine

## 2024-10-02 ENCOUNTER — Telehealth: Payer: Self-pay

## 2024-10-02 ENCOUNTER — Encounter: Payer: Self-pay | Admitting: Family Medicine

## 2024-10-02 DIAGNOSIS — F1010 Alcohol abuse, uncomplicated: Secondary | ICD-10-CM | POA: Diagnosis not present

## 2024-10-02 DIAGNOSIS — F411 Generalized anxiety disorder: Secondary | ICD-10-CM | POA: Diagnosis not present

## 2024-10-02 DIAGNOSIS — F331 Major depressive disorder, recurrent, moderate: Secondary | ICD-10-CM | POA: Diagnosis not present

## 2024-10-02 DIAGNOSIS — F3181 Bipolar II disorder: Secondary | ICD-10-CM | POA: Diagnosis not present

## 2024-10-02 NOTE — Telephone Encounter (Signed)
 error

## 2024-10-04 ENCOUNTER — Other Ambulatory Visit: Payer: Self-pay | Admitting: Family Medicine

## 2024-10-05 DIAGNOSIS — F331 Major depressive disorder, recurrent, moderate: Secondary | ICD-10-CM | POA: Diagnosis not present

## 2024-10-06 DIAGNOSIS — Z960 Presence of urogenital implants: Secondary | ICD-10-CM | POA: Diagnosis not present

## 2024-10-06 DIAGNOSIS — N320 Bladder-neck obstruction: Secondary | ICD-10-CM | POA: Diagnosis not present

## 2024-10-06 DIAGNOSIS — Z9079 Acquired absence of other genital organ(s): Secondary | ICD-10-CM | POA: Diagnosis not present

## 2024-10-07 NOTE — Telephone Encounter (Signed)
 Please read patient MyChart message.

## 2024-10-07 NOTE — Telephone Encounter (Signed)
 Form has been completed and faxed.

## 2024-10-13 ENCOUNTER — Encounter: Payer: BC Managed Care – PPO | Admitting: Psychology

## 2024-10-14 ENCOUNTER — Encounter: Payer: Self-pay | Admitting: Family Medicine

## 2024-10-16 DIAGNOSIS — F331 Major depressive disorder, recurrent, moderate: Secondary | ICD-10-CM | POA: Diagnosis not present

## 2024-10-23 DIAGNOSIS — F331 Major depressive disorder, recurrent, moderate: Secondary | ICD-10-CM | POA: Diagnosis not present

## 2024-11-11 ENCOUNTER — Other Ambulatory Visit: Payer: Self-pay | Admitting: Physician Assistant

## 2024-11-11 DIAGNOSIS — M542 Cervicalgia: Secondary | ICD-10-CM

## 2024-11-16 ENCOUNTER — Other Ambulatory Visit: Payer: Self-pay | Admitting: Physician Assistant

## 2024-11-16 DIAGNOSIS — M545 Low back pain, unspecified: Secondary | ICD-10-CM

## 2024-11-20 ENCOUNTER — Ambulatory Visit
Admission: RE | Admit: 2024-11-20 | Discharge: 2024-11-20 | Disposition: A | Source: Ambulatory Visit | Attending: Physician Assistant | Admitting: Physician Assistant

## 2024-11-20 DIAGNOSIS — M542 Cervicalgia: Secondary | ICD-10-CM

## 2024-11-24 ENCOUNTER — Ambulatory Visit
Admission: RE | Admit: 2024-11-24 | Discharge: 2024-11-24 | Disposition: A | Discharge status: Home / Self Care | Source: Ambulatory Visit | Attending: Physician Assistant | Admitting: Physician Assistant

## 2024-11-24 DIAGNOSIS — M545 Low back pain, unspecified: Secondary | ICD-10-CM

## 2024-12-01 ENCOUNTER — Encounter: Payer: Self-pay | Admitting: Family Medicine

## 2024-12-01 DIAGNOSIS — J339 Nasal polyp, unspecified: Secondary | ICD-10-CM

## 2024-12-01 NOTE — Telephone Encounter (Signed)
 Please review and advise. Tks

## 2025-01-07 ENCOUNTER — Institutional Professional Consult (permissible substitution) (INDEPENDENT_AMBULATORY_CARE_PROVIDER_SITE_OTHER)

## 2025-02-24 ENCOUNTER — Ambulatory Visit: Admitting: Adult Health

## 2025-07-14 ENCOUNTER — Encounter: Admitting: Family Medicine

## 2025-10-13 ENCOUNTER — Encounter: Admitting: Psychology
